# Patient Record
Sex: Female | Born: 2000 | Race: Black or African American | Hispanic: No | Smoking: Former smoker
Health system: Southern US, Community
[De-identification: ages and names within clinical notes are randomized; demographics above are authoritative.]

## PROBLEM LIST (undated history)

## (undated) ENCOUNTER — Inpatient Hospital Stay (HOSPITAL_COMMUNITY): Payer: Self-pay

## (undated) DIAGNOSIS — F29 Unspecified psychosis not due to a substance or known physiological condition: Secondary | ICD-10-CM

## (undated) DIAGNOSIS — K429 Umbilical hernia without obstruction or gangrene: Secondary | ICD-10-CM

## (undated) DIAGNOSIS — D649 Anemia, unspecified: Secondary | ICD-10-CM

## (undated) DIAGNOSIS — K5900 Constipation, unspecified: Secondary | ICD-10-CM

## (undated) HISTORY — PX: UMBILICAL HERNIA REPAIR: SHX196

## (undated) HISTORY — DX: Constipation, unspecified: K59.00

---
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
## 2018-01-13 ENCOUNTER — Inpatient Hospital Stay (HOSPITAL_COMMUNITY)
Admission: AD | Admit: 2018-01-13 | Discharge: 2018-01-13 | Disposition: A | Discharge status: Home / Self Care | Payer: Medicaid Other | Source: Ambulatory Visit | Attending: Obstetrics and Gynecology | Admitting: Obstetrics and Gynecology

## 2018-01-13 ENCOUNTER — Inpatient Hospital Stay (HOSPITAL_COMMUNITY): Payer: Medicaid Other

## 2018-01-13 ENCOUNTER — Other Ambulatory Visit: Payer: Self-pay

## 2018-01-13 ENCOUNTER — Encounter (HOSPITAL_COMMUNITY): Payer: Self-pay

## 2018-01-13 DIAGNOSIS — Z818 Family history of other mental and behavioral disorders: Secondary | ICD-10-CM | POA: Diagnosis not present

## 2018-01-13 DIAGNOSIS — Z79899 Other long term (current) drug therapy: Secondary | ICD-10-CM | POA: Insufficient documentation

## 2018-01-13 DIAGNOSIS — O99341 Other mental disorders complicating pregnancy, first trimester: Secondary | ICD-10-CM | POA: Insufficient documentation

## 2018-01-13 DIAGNOSIS — F209 Schizophrenia, unspecified: Secondary | ICD-10-CM | POA: Diagnosis not present

## 2018-01-13 DIAGNOSIS — Z811 Family history of alcohol abuse and dependence: Secondary | ICD-10-CM | POA: Diagnosis not present

## 2018-01-13 DIAGNOSIS — O26891 Other specified pregnancy related conditions, first trimester: Secondary | ICD-10-CM | POA: Diagnosis not present

## 2018-01-13 DIAGNOSIS — R1030 Lower abdominal pain, unspecified: Secondary | ICD-10-CM | POA: Insufficient documentation

## 2018-01-13 DIAGNOSIS — R109 Unspecified abdominal pain: Secondary | ICD-10-CM

## 2018-01-13 DIAGNOSIS — Z8249 Family history of ischemic heart disease and other diseases of the circulatory system: Secondary | ICD-10-CM | POA: Insufficient documentation

## 2018-01-13 DIAGNOSIS — Z825 Family history of asthma and other chronic lower respiratory diseases: Secondary | ICD-10-CM | POA: Diagnosis not present

## 2018-01-13 DIAGNOSIS — Z3A08 8 weeks gestation of pregnancy: Secondary | ICD-10-CM | POA: Insufficient documentation

## 2018-01-13 DIAGNOSIS — Z813 Family history of other psychoactive substance abuse and dependence: Secondary | ICD-10-CM | POA: Diagnosis not present

## 2018-01-13 DIAGNOSIS — Z3491 Encounter for supervision of normal pregnancy, unspecified, first trimester: Secondary | ICD-10-CM

## 2018-01-13 DIAGNOSIS — Z823 Family history of stroke: Secondary | ICD-10-CM | POA: Diagnosis not present

## 2018-01-13 LAB — URINALYSIS, ROUTINE W REFLEX MICROSCOPIC
BILIRUBIN URINE: NEGATIVE
Bacteria, UA: NONE SEEN
Glucose, UA: NEGATIVE mg/dL
HGB URINE DIPSTICK: NEGATIVE
Ketones, ur: NEGATIVE mg/dL
NITRITE: NEGATIVE
PROTEIN: NEGATIVE mg/dL
SPECIFIC GRAVITY, URINE: 1.024 (ref 1.005–1.030)
pH: 6 (ref 5.0–8.0)

## 2018-01-13 LAB — WET PREP, GENITAL
SPERM: NONE SEEN
Trich, Wet Prep: NONE SEEN
Yeast Wet Prep HPF POC: NONE SEEN

## 2018-01-13 LAB — CBC
HEMATOCRIT: 37.4 % (ref 36.0–46.0)
HEMOGLOBIN: 12.9 g/dL (ref 12.0–15.0)
MCH: 29.1 pg (ref 26.0–34.0)
MCHC: 34.5 g/dL (ref 30.0–36.0)
MCV: 84.4 fL (ref 78.0–100.0)
Platelets: 225 10*3/uL (ref 150–400)
RBC: 4.43 MIL/uL (ref 3.87–5.11)
RDW: 12.6 % (ref 11.5–15.5)
WBC: 9.1 10*3/uL (ref 4.0–10.5)

## 2018-01-13 LAB — POCT PREGNANCY, URINE: Preg Test, Ur: POSITIVE — AB

## 2018-01-13 LAB — ABO/RH: ABO/RH(D): O POS

## 2018-01-13 LAB — HCG, QUANTITATIVE, PREGNANCY: HCG, BETA CHAIN, QUANT, S: 165510 m[IU]/mL — AB (ref <5)

## 2018-01-13 NOTE — MAU Provider Note (Signed)
History     CSN: [id number]  Arrival date and time: 01/13/18 1052   First Provider Initiated Contact with Patient 01/13/18 1151      Chief Complaint  Patient presents with  . Abdominal Pain   HPI Dana Flores is a 17 y.o. G1P0 at unknown gestational age who presents with abdominal pain. Was treated for PID by her PCP in April but did not complete treatment due to side effects. A week after she stopped taking the medication, she had a positive pregnancy test.  Reports continued lower abdominal pain since then. Rates pain 3/10. Has not treated. Denies fever/chills, dysuria, vaginal bleeding, or vaginal discharge. Has initial OB appointment scheduled with [facility].    Past Medical History:  Diagnosis Date  . 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 Umbilical hernia     Past Surgical History:  Procedure Laterality Date  . UMBILICAL HERNIA REPAIR      Family History  Problem Relation Age of Onset  . ADD / ADHD Father   . Alcohol abuse Father   . Asthma Father   . Drug abuse Father   . Hypertension Father   . Stroke Father   . ADD / ADHD Brother   . Anxiety disorder Brother   . Asthma Brother   . Heart disease Maternal Grandmother   . Obesity Maternal Grandmother   . Diabetes Paternal Grandmother   . Hyperlipidemia Paternal Grandmother   . Obesity Paternal Grandmother     Social History   Tobacco Use  . Smoking status: Never Smoker  . Smokeless tobacco: Never Used  Substance Use Topics  . Alcohol use: No  . Drug use: Not Currently    Types: Marijuana    Comment: pt. states she hasn't used since 2017    Allergies: No Known Allergies  Medications Prior to Admission  Medication Sig Dispense Refill Last Dose  . benztropine (COGENTIN) 0.5 MG tablet Take 1 tablet (0.5 mg total) by mouth daily. 30 tablet 0   . benztropine (COGENTIN) 1 MG tablet Take 1 tablet (1 mg total) by mouth at bedtime. 30 tablet 0   .  divalproex (DEPAKOTE) 500 MG DR tablet Take 1 tablet (500 mg total) by mouth every 12 (twelve) hours. 60 tablet 0   . haloperidol (HALDOL) 2 MG tablet Take 1 tablet (2 mg total) by mouth at bedtime. 30 tablet 0   . hydrOXYzine (ATARAX/VISTARIL) 25 MG tablet Take 1 tablet (25 mg total) by mouth 3 (three) times daily as needed for anxiety (sleep). 30 tablet 0   . naproxen sodium (ANAPROX) 220 MG tablet Take 440 mg by mouth every 12 (twelve) hours as needed (CRAMPING).   Past Week at Unknown time  . OLANZapine (ZYPREXA) 20 MG tablet Take 1 tablet (20 mg total) by mouth at bedtime. 30 tablet 0   . OLANZapine zydis (ZYPREXA) 20 MG disintegrating tablet Take 1 tablet (20 mg total) by mouth daily. 30 tablet 0     Review of Systems  Constitutional: Negative.   Gastrointestinal: Positive for abdominal pain. Negative for constipation, diarrhea, nausea and vomiting.  Genitourinary: Negative.  Negative for dysuria, vaginal bleeding and vaginal discharge.   Physical Exam   Blood pressure 116/64, pulse 79, temperature 98.3 F (36.8 C), temperature source Oral, resp. rate 18, height 4\' 11"  (1.499 m), weight 160 lb 0.6 oz (72.6 kg), SpO2 100 %.  Physical Exam  Nursing note and vitals reviewed. Constitutional: She is oriented  to person, place, and time. She appears well-developed and well-nourished. No distress.  HENT:  Head: Normocephalic and atraumatic.  Eyes: Conjunctivae are normal. Right eye exhibits no discharge. Left eye exhibits no discharge. No scleral icterus.  Neck: Normal range of motion.  Respiratory: Effort normal. No respiratory distress.  GI: Soft. There is no tenderness.  Genitourinary: Cervix exhibits no motion tenderness. No bleeding in the vagina.  Genitourinary Comments: Cervix closed  Neurological: She is alert and oriented to person, place, and time.  Skin: Skin is warm and dry. She is not diaphoretic.  Psychiatric: She has a normal mood and affect. Her behavior is normal.  Judgment and thought content normal.    MAU Course  Procedures Results for orders placed or performed during the hospital encounter of 01/13/18 (from the past 24 hour(s))  Urinalysis, Routine w reflex microscopic     Status: Abnormal   Collection Time: 01/13/18 10:57 AM  Result Value Ref Range   Color, Urine YELLOW YELLOW   APPearance HAZY (A) CLEAR   Specific Gravity, Urine 1.024 1.005 - 1.030   pH 6.0 5.0 - 8.0   Glucose, UA NEGATIVE NEGATIVE mg/dL   Hgb urine dipstick NEGATIVE NEGATIVE   Bilirubin Urine NEGATIVE NEGATIVE   Ketones, ur NEGATIVE NEGATIVE mg/dL   Protein, ur NEGATIVE NEGATIVE mg/dL   Nitrite NEGATIVE NEGATIVE   Leukocytes, UA TRACE (A) NEGATIVE   RBC / HPF 0-5 0 - 5 RBC/hpf   WBC, UA 21-50 0 - 5 WBC/hpf   Bacteria, UA NONE SEEN NONE SEEN   Squamous Epithelial / LPF 11-20 0 - 5   Mucus PRESENT   Pregnancy, urine POC     Status: Abnormal   Collection Time: 01/13/18 11:12 AM  Result Value Ref Range   Preg Test, Ur POSITIVE (A) NEGATIVE  Wet prep, genital     Status: Abnormal   Collection Time: 01/13/18 12:10 PM  Result Value Ref Range   Yeast Wet Prep HPF POC NONE SEEN NONE SEEN   Trich, Wet Prep NONE SEEN NONE SEEN   Clue Cells Wet Prep HPF POC PRESENT (A) NONE SEEN   WBC, Wet Prep HPF POC FEW (A) NONE SEEN   Sperm NONE SEEN   CBC     Status: None   Collection Time: 01/13/18 12:18 PM  Result Value Ref Range   WBC 9.1 4.0 - 10.5 K/uL   RBC 4.43 3.87 - 5.11 MIL/uL   Hemoglobin 12.9 12.0 - 15.0 g/dL   HCT 16.1 09.6 - 04.5 %   MCV 84.4 78.0 - 100.0 fL   MCH 29.1 26.0 - 34.0 pg   MCHC 34.5 30.0 - 36.0 g/dL   RDW 40.9 81.1 - 91.4 %   Platelets 225 150 - 400 K/uL  ABO/Rh     Status: None (Preliminary result)   Collection Time: 01/13/18 12:18 PM  Result Value Ref Range   ABO/RH(D)      O POS Performed at [facility], [address], [place], [place] [address]   hCG, quantitative, pregnancy     Status: Abnormal   Collection Time: 01/13/18 12:18 PM   Result Value Ref Range   hCG, Beta Chain, Quant, S 165,510 (H) <5 mIU/mL   US Ob Less Than 14 Weeks With Ob Transvaginal  Result Date: 01/13/2018 CLINICAL DATA:  Pregnant patient with pain EXAM: OBSTETRIC <14 WK ULTRASOUND TECHNIQUE: Transabdominal ultrasound was performed for evaluation of the gestation as well as the maternal uterus and adnexal regions. COMPARISON:  None.  FINDINGS: Intrauterine gestational sac: Single Yolk sac:  Visualized. Embryo:  Visualized. Cardiac Activity: Visualized. Heart Rate: 174 bpm MSD:   mm    w     d CRL:   20.1 mm   8 w 4 d                  US EDC: August 21, 2018 Subchorionic hemorrhage:  None visualized. Maternal uterus/adnexae: Normal appearing ovaries. IMPRESSION: Single live IUP. Electronically Signed   By: Gerome Samavid  Williams III M.D   On: 01/13/2018 13:28    MDM +UPT UA, wet prep, GC/chlamydia, CBC, ABO/Rh, quant hCG, HIV, and US today to rule out ectopic pregnancy  Ultrasound shows SIUP with cardiac activity measuring [id number]  U/a with leuks -- will send for urine culture  Assessment and Plan  A:  1. Normal IUP (intrauterine pregnancy) on prenatal ultrasound, first trimester   2. Abdominal pain during pregnancy in first trimester   3. [redacted] weeks gestation of pregnancy    P: Discharge home Start prenatal care Discussed reasons to return to MAU GC/CT & urine culture pending  Judeth Hornrin Jamien Casanova 01/13/2018, 11:51 AM

## 2018-01-13 NOTE — MAU Note (Signed)
Pt. States she started taking an antibiotic at the end of April 2019 for PID, but states that it "caused her more pain" so she stopped taking it around the first week of May. Pt. Had positive home pregnancy test. Pt. States she has been having abdominal pain since taking the antibiotic.  Pt. Denies vaginal bleeding, but has noticed white discharge.

## 2018-01-13 NOTE — Discharge Instructions (Signed)
First Trimester of Pregnancy The first trimester of pregnancy is from week 1 until the end of week 13 (months 1 through 3). A week after a sperm fertilizes an egg, the egg will implant on the wall of the uterus. This embryo will begin to develop into a baby. Genes from you and your partner will form the baby. The female genes will determine whether the baby will be a boy or a girl. At 6-8 weeks, the eyes and face will be formed, and the heartbeat can be seen on ultrasound. At the end of 12 weeks, all the baby's organs will be formed. Now that you are pregnant, you will want to do everything you can to have a healthy baby. Two of the most important things are to get good prenatal care and to follow your health care provider's instructions. Prenatal care is all the medical care you receive before the baby's birth. This care will help prevent, find, and treat any problems during the pregnancy and childbirth. Body changes during your first trimester Your body goes through many changes during pregnancy. The changes vary from woman to woman.  You may gain or lose a couple of pounds at first.  You may feel sick to your stomach (nauseous) and you may throw up (vomit). If the vomiting is uncontrollable, call your health care provider.  You may tire easily.  You may develop headaches that can be relieved by medicines. All medicines should be approved by your health care provider.  You may urinate more often. Painful urination may mean you have a bladder infection.  You may develop heartburn as a result of your pregnancy.  You may develop constipation because certain hormones are causing the muscles that push stool through your intestines to slow down.  You may develop hemorrhoids or swollen veins (varicose veins).  Your breasts may begin to grow larger and become tender. Your nipples may stick out more, and the tissue that surrounds them (areola) may become darker.  Your gums may bleed and may be  sensitive to brushing and flossing.  Dark spots or blotches (chloasma, mask of pregnancy) may develop on your face. This will likely fade after the baby is born.  Your menstrual periods will stop.  You may have a loss of appetite.  You may develop cravings for certain kinds of food.  You may have changes in your emotions from day to day, such as being excited to be pregnant or being concerned that something may go wrong with the pregnancy and baby.  You may have more vivid and strange dreams.  You may have changes in your hair. These can include thickening of your hair, rapid growth, and changes in texture. Some women also have hair loss during or after pregnancy, or hair that feels dry or thin. Your hair will most likely return to normal after your baby is born.  What to expect at prenatal visits During a routine prenatal visit:  You will be weighed to make sure you and the baby are growing normally.  Your blood pressure will be taken.  Your abdomen will be measured to track your baby's growth.  The fetal heartbeat will be listened to between weeks 10 and 14 of your pregnancy.  Test results from any previous visits will be discussed.  Your health care provider may ask you:  How you are feeling.  If you are feeling the baby move.  If you have had any abnormal symptoms, such as leaking fluid, bleeding, severe headaches,   or abdominal cramping.  If you are using any tobacco products, including cigarettes, chewing tobacco, and electronic cigarettes.  If you have any questions.  Other tests that may be performed during your first trimester include:  Blood tests to find your blood type and to check for the presence of any previous infections. The tests will also be used to check for low iron levels (anemia) and protein on red blood cells (Rh antibodies). Depending on your risk factors, or if you previously had diabetes during pregnancy, you may have tests to check for high blood  sugar that affects pregnant women (gestational diabetes).  Urine tests to check for infections, diabetes, or protein in the urine.  An ultrasound to confirm the proper growth and development of the baby.  Fetal screens for spinal cord problems (spina bifida) and Down syndrome.  HIV (human immunodeficiency virus) testing. Routine prenatal testing includes screening for HIV, unless you choose not to have this test.  You may need other tests to make sure you and the baby are doing well.  Follow these instructions at home: Medicines  Follow your health care provider's instructions regarding medicine use. Specific medicines may be either safe or unsafe to take during pregnancy.  Take a prenatal vitamin that contains at least 600 micrograms (mcg) of folic acid.  If you develop constipation, try taking a stool softener if your health care provider approves. Eating and drinking  Eat a balanced diet that includes fresh fruits and vegetables, whole grains, good sources of protein such as meat, eggs, or tofu, and low-fat dairy. Your health care provider will help you determine the amount of weight gain that is right for you.  Avoid raw meat and uncooked cheese. These carry germs that can cause birth defects in the baby.  Eating four or five small meals rather than three large meals a day may help relieve nausea and vomiting. If you start to feel nauseous, eating a few soda crackers can be helpful. Drinking liquids between meals, instead of during meals, also seems to help ease nausea and vomiting.  Limit foods that are high in fat and processed sugars, such as fried and sweet foods.  To prevent constipation: ? Eat foods that are high in fiber, such as fresh fruits and vegetables, whole grains, and beans. ? Drink enough fluid to keep your urine clear or pale yellow. Activity  Exercise only as directed by your health care provider. Most women can continue their usual exercise routine during  pregnancy. Try to exercise for 30 minutes at least 5 days a week. Exercising will help you: ? Control your weight. ? Stay in shape. ? Be prepared for labor and delivery.  Experiencing pain or cramping in the lower abdomen or lower back is a good sign that you should stop exercising. Check with your health care provider before continuing with normal exercises.  Try to avoid standing for long periods of time. Move your legs often if you must stand in one place for a long time.  Avoid heavy lifting.  Wear low-heeled shoes and practice good posture.  You may continue to have sex unless your health care provider tells you not to. Relieving pain and discomfort  Wear a good support bra to relieve breast tenderness.  Take warm sitz baths to soothe any pain or discomfort caused by hemorrhoids. Use hemorrhoid cream if your health care provider approves.  Rest with your legs elevated if you have leg cramps or low back pain.  If you develop   varicose veins in your legs, wear support hose. Elevate your feet for 15 minutes, 3-4 times a day. Limit salt in your diet. Prenatal care  Schedule your prenatal visits by the twelfth week of pregnancy. They are usually scheduled monthly at first, then more often in the last 2 months before delivery.  Write down your questions. Take them to your prenatal visits.  Keep all your prenatal visits as told by your health care provider. This is important. Safety  Wear your seat belt at all times when driving.  Make a list of emergency phone numbers, including numbers for family, friends, the hospital, and police and fire departments. General instructions  Ask your health care provider for a referral to a local prenatal education class. Begin classes no later than the beginning of month 6 of your pregnancy.  Ask for help if you have counseling or nutritional needs during pregnancy. Your health care provider can offer advice or refer you to specialists for help  with various needs.  Do not use hot tubs, steam rooms, or saunas.  Do not douche or use tampons or scented sanitary pads.  Do not cross your legs for long periods of time.  Avoid cat litter boxes and soil used by cats. These carry germs that can cause birth defects in the baby and possibly loss of the fetus by miscarriage or stillbirth.  Avoid all smoking, herbs, alcohol, and medicines not prescribed by your health care provider. Chemicals in these products affect the formation and growth of the baby.  Do not use any products that contain nicotine or tobacco, such as cigarettes and e-cigarettes. If you need help quitting, ask your health care provider. You may receive counseling support and other resources to help you quit.  Schedule a dentist appointment. At home, brush your teeth with a soft toothbrush and be gentle when you floss. Contact a health care provider if:  You have dizziness.  You have mild pelvic cramps, pelvic pressure, or nagging pain in the abdominal area.  You have persistent nausea, vomiting, or diarrhea.  You have a bad smelling vaginal discharge.  You have pain when you urinate.  You notice increased swelling in your face, hands, legs, or ankles.  You are exposed to fifth disease or chickenpox.  You are exposed to German measles (rubella) and have never had it. Get help right away if:  You have a fever.  You are leaking fluid from your vagina.  You have spotting or bleeding from your vagina.  You have severe abdominal cramping or pain.  You have rapid weight gain or loss.  You vomit blood or material that looks like coffee grounds.  You develop a severe headache.  You have shortness of breath.  You have any kind of trauma, such as from a fall or a car accident. Summary  The first trimester of pregnancy is from week 1 until the end of week 13 (months 1 through 3).  Your body goes through many changes during pregnancy. The changes vary from  woman to woman.  You will have routine prenatal visits. During those visits, your health care provider will examine you, discuss any test results you may have, and talk with you about how you are feeling. This information is not intended to replace advice given to you by your health care provider. Make sure you discuss any questions you have with your health care provider. Document Released: 07/26/2001 Document Revised: 07/13/2016 Document Reviewed: 07/13/2016 Elsevier Interactive Patient Education  2018 Elsevier   Inc.  

## 2018-01-15 ENCOUNTER — Telehealth: Payer: Self-pay | Admitting: Student

## 2018-01-15 DIAGNOSIS — O2341 Unspecified infection of urinary tract in pregnancy, first trimester: Secondary | ICD-10-CM

## 2018-01-15 LAB — CULTURE, OB URINE: Culture: >=100000 — AB

## 2018-01-15 LAB — GC/CHLAMYDIA PROBE AMP (~~LOC~~) NOT AT ARMC
Chlamydia: NEGATIVE
Neisseria Gonorrhea: NEGATIVE

## 2018-01-15 MED ORDER — FOSFOMYCIN TROMETHAMINE 3 G PO PACK: 3.0000 g | PACK | Freq: Once | ORAL | 0 refills | Status: AC

## 2018-01-15 NOTE — Telephone Encounter (Signed)
Verified by name & DOB. Notified of positive urine cx. Verified NKDA. Meds sent to pharmacy.   Urine culture reviewed with pharmacist. Recommends 1 dose of fosfomycin.   Judeth HornLawrence, Delvis Kau, NP

## 2018-01-24 ENCOUNTER — Encounter: Payer: Medicaid Other | Admitting: Certified Nurse Midwife

## 2018-02-02 ENCOUNTER — Telehealth: Payer: Self-pay | Admitting: Licensed Clinical Social Worker

## 2018-02-02 NOTE — Telephone Encounter (Signed)
Patient called. Appt confirmed

## 2018-02-05 ENCOUNTER — Encounter: Payer: Self-pay | Admitting: Certified Nurse Midwife

## 2018-02-05 ENCOUNTER — Other Ambulatory Visit (HOSPITAL_COMMUNITY)
Admission: RE | Admit: 2018-02-05 | Discharge: 2018-02-05 | Disposition: A | Discharge status: Home / Self Care | Payer: Medicaid Other | Source: Ambulatory Visit | Attending: Certified Nurse Midwife | Admitting: Certified Nurse Midwife

## 2018-02-05 ENCOUNTER — Ambulatory Visit (INDEPENDENT_AMBULATORY_CARE_PROVIDER_SITE_OTHER): Payer: Medicaid Other | Admitting: Certified Nurse Midwife

## 2018-02-05 VITALS — BP 110/73 | HR 87 | Wt 159.0 lb

## 2018-02-05 DIAGNOSIS — Z3401 Encounter for supervision of normal first pregnancy, first trimester: Secondary | ICD-10-CM | POA: Insufficient documentation

## 2018-02-05 DIAGNOSIS — Z3400 Encounter for supervision of normal first pregnancy, unspecified trimester: Secondary | ICD-10-CM

## 2018-02-05 DIAGNOSIS — Z8719 Personal history of other diseases of the digestive system: Secondary | ICD-10-CM | POA: Insufficient documentation

## 2018-02-05 DIAGNOSIS — O99611 Diseases of the digestive system complicating pregnancy, first trimester: Secondary | ICD-10-CM

## 2018-02-05 DIAGNOSIS — O2341 Unspecified infection of urinary tract in pregnancy, first trimester: Secondary | ICD-10-CM

## 2018-02-05 DIAGNOSIS — Z9889 Other specified postprocedural states: Secondary | ICD-10-CM

## 2018-02-05 DIAGNOSIS — O219 Vomiting of pregnancy, unspecified: Secondary | ICD-10-CM

## 2018-02-05 DIAGNOSIS — K219 Gastro-esophageal reflux disease without esophagitis: Secondary | ICD-10-CM

## 2018-02-05 MED ORDER — RANITIDINE HCL 150 MG/10ML PO SYRP: 150.0000 mg | ORAL_SOLUTION | Freq: Two times a day (BID) | ORAL | 5 refills | Status: DC

## 2018-02-05 MED ORDER — DOXYLAMINE-PYRIDOXINE 10-10 MG PO TBEC: DELAYED_RELEASE_TABLET | ORAL | 4 refills | Status: DC

## 2018-02-05 MED ORDER — CLINDAMYCIN HCL 300 MG PO CAPS: 300.0000 mg | ORAL_CAPSULE | Freq: Three times a day (TID) | ORAL | 0 refills | Status: DC

## 2018-02-05 NOTE — Addendum Note (Signed)
Addended byFrutoso Chase: [facility], Mearl Olver on: 02/05/2018 02:48 PM   Modules accepted: Orders

## 2018-02-05 NOTE — Progress Notes (Signed)
Subjective:   Dana Flores is a 17 y.o. G1P0 at [redacted]w[redacted]d by LMP, early ultrasound being seen today for her first obstetrical visit.  Her obstetrical history is significant for obesity and hx of depression/mental issues that according to the patient were situationally induced. Patient does intend to breast feed. Pregnancy history fully reviewed.  Patient reports heartburn, nausea, no bleeding, no contractions, no cramping, no leaking and vomiting.  HISTORY: OB History  Gravida Para Term Preterm AB Living  1 0 0 0 0 0  SAB TAB Ectopic Multiple Live Births  0 0 0 0 0    # Outcome Date GA Lbr Len/2nd Weight Sex Delivery Anes PTL Lv  1 Current             Last pap smear was done n/a <21 years.   Past Medical History:  Diagnosis Date  . 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 Umbilical hernia    Past Surgical History:  Procedure Laterality Date  . TOOTH EXTRACTION  2014  . UMBILICAL HERNIA REPAIR     Family History  Problem Relation Age of Onset  . ADD / ADHD Father   . Alcohol abuse Father   . Asthma Father   . Drug abuse Father   . Hypertension Father   . Stroke Father   . ADD / ADHD Brother   . Anxiety disorder Brother   . Asthma Brother   . Heart disease Maternal Grandmother   . Obesity Maternal Grandmother   . Early death Maternal Grandmother   . Diabetes Paternal Grandmother   . Hyperlipidemia Paternal Grandmother   . Obesity Paternal Grandmother    Social History   Tobacco Use  . Smoking status: Never Smoker  . Smokeless tobacco: Never Used  Substance Use Topics  . Alcohol use: No  . Drug use: Not Currently    Comment: pt. states she hasn't used since 2017   No Known Allergies Current Outpatient Medications on File Prior to Visit  Medication Sig Dispense Refill  . Prenatal MV & Min w/FA-DHA (PRENATAL ADULT GUMMY/DHA/FA PO) Take by mouth.     No current facility-administered medications on  file prior to visit.     Review of Systems Pertinent items noted in HPI and remainder of comprehensive ROS otherwise negative.  Exam   Vitals:   02/05/18 1318  BP: 110/73  Pulse: 87  Weight: 159 lb (72.1 kg)   Fetal Heart Rate (bpm): 177; doppler  Uterus:     Pelvic Exam: Perineum: no hemorrhoids, normal perineum.  Pelvic exam declined/refused  System: General: well-developed, well-nourished female in no acute distress   Breast:  normal appearance, no masses or tenderness   Skin: normal coloration and turgor, no rashes   Neurologic: oriented, normal, negative, normal mood   Extremities: normal strength, tone, and muscle mass, ROM of all joints is normal   HEENT PERRLA, extraocular movement intact and sclera clear, anicteric   Mouth/Teeth mucous membranes moist, pharynx normal without lesions and dental hygiene good   Neck supple and no masses   Cardiovascular: regular rate and rhythm   Respiratory:  no respiratory distress, normal breath sounds   Abdomen: soft, non-tender; bowel sounds normal; no masses,  no organomegaly     Assessment:   Pregnancy: G1P0 Patient Active Problem List   Diagnosis Date Noted  . Supervision of normal first pregnancy, antepartum 02/05/2018     Plan:  1. UTI (urinary tract  infection) during pregnancy, first trimester     Discussed with Dr. Clearance CootsHarper.  - clindamycin (CLEOCIN) 300 MG capsule; Take 1 capsule (300 mg total) by mouth 3 (three) times daily.  Dispense: 21 capsule; Refill: 0  2. Encounter for supervision of normal first pregnancy in first trimester    - Obstetric Panel, Including HIV - Hemoglobinopathy evaluation - Inheritest Core(CF97,SMA,FraX) - Hemoglobin A1c - Vitamin D (25 hydroxy) - US MFM OB DETAIL +14 WK; Future  3. Nausea and vomiting in pregnancy    - Doxylamine-Pyridoxine (DICLEGIS) 10-10 MG TBEC; Take 1 tablet with breakfast and lunch.  Take 2 tablets at bedtime.  Dispense: 100 tablet; Refill: 4  4. Supervision of  normal first pregnancy, antepartum       5. Gastroesophageal reflux during pregnancy in first trimester, antepartum     - ranitidine (ZANTAC) 150 MG/10ML syrup; Take 10 mLs (150 mg total) by mouth 2 (two) times daily.  Dispense: 473 mL; Refill: 5   Initial labs drawn. Continue prenatal vitamins. Genetic Screening discussed, NIPS: ordered. Ultrasound discussed; fetal anatomic survey: ordered. Problem list reviewed and updated. The nature of [facility] with multiple MDs and other Advanced Practice Providers was explained to patient; also emphasized that residents, students are part of our team. Routine obstetric precautions reviewed. Return in about 1 month (around 03/05/2018) for ROB.     Dana Flores, CNM [facility], [facility]

## 2018-02-06 LAB — CERVICOVAGINAL ANCILLARY ONLY
Chlamydia: NEGATIVE
Neisseria Gonorrhea: NEGATIVE

## 2018-02-08 LAB — OBSTETRIC PANEL, INCLUDING HIV
Antibody Screen: NEGATIVE
BASOS ABS: 0 10*3/uL (ref 0.0–0.2)
BASOS: 0 %
EOS (ABSOLUTE): 0.1 10*3/uL (ref 0.0–0.4)
EOS: 1 %
HEMATOCRIT: 37.1 % (ref 34.0–46.6)
HEMOGLOBIN: 13 g/dL (ref 11.1–15.9)
HIV SCREEN 4TH GENERATION: NONREACTIVE
Hepatitis B Surface Ag: NEGATIVE
Immature Grans (Abs): 0 10*3/uL (ref 0.0–0.1)
Immature Granulocytes: 0 %
Lymphocytes Absolute: 2 10*3/uL (ref 0.7–3.1)
Lymphs: 21 %
MCH: 28.7 pg (ref 26.6–33.0)
MCHC: 35 g/dL (ref 31.5–35.7)
MCV: 82 fL (ref 79–97)
Monocytes Absolute: 0.8 10*3/uL (ref 0.1–0.9)
Monocytes: 8 %
NEUTROS ABS: 6.8 10*3/uL (ref 1.4–7.0)
Neutrophils: 70 %
PLATELETS: 213 10*3/uL (ref 150–450)
RBC: 4.53 x10E6/uL (ref 3.77–5.28)
RDW: 14.1 % (ref 12.3–15.4)
RH TYPE: POSITIVE
RPR: NONREACTIVE
RUBELLA: 1.42 {index} (ref >0.99)
WBC: 9.7 10*3/uL (ref 3.4–10.8)

## 2018-02-08 LAB — HEMOGLOBINOPATHY EVALUATION
HGB A: 97.7 % (ref 96.4–98.8)
HGB C: 0 %
HGB S: 0 %
HGB VARIANT: 0 %
Hemoglobin A2 Quantitation: 2.3 % (ref 1.8–3.2)
Hemoglobin F Quantitation: 0 % (ref 0.0–2.0)

## 2018-02-08 LAB — HEMOGLOBIN A1C
Est. average glucose Bld gHb Est-mCnc: 100 mg/dL
Hgb A1c MFr Bld: 5.1 % (ref 4.8–5.6)

## 2018-02-08 LAB — VITAMIN D 25 HYDROXY (VIT D DEFICIENCY, FRACTURES): Vit D, 25-Hydroxy: 18.7 ng/mL — ABNORMAL LOW (ref 30.0–100.0)

## 2018-02-12 ENCOUNTER — Encounter: Payer: Self-pay | Admitting: *Deleted

## 2018-02-14 LAB — INHERITEST CORE(CF97,SMA,FRAX)

## 2018-02-19 ENCOUNTER — Encounter: Payer: Self-pay | Admitting: *Deleted

## 2018-02-20 ENCOUNTER — Other Ambulatory Visit: Payer: Self-pay | Admitting: Certified Nurse Midwife

## 2018-02-20 DIAGNOSIS — Z3400 Encounter for supervision of normal first pregnancy, unspecified trimester: Secondary | ICD-10-CM

## 2018-02-22 ENCOUNTER — Other Ambulatory Visit: Payer: Self-pay | Admitting: Certified Nurse Midwife

## 2018-02-22 DIAGNOSIS — Z3400 Encounter for supervision of normal first pregnancy, unspecified trimester: Secondary | ICD-10-CM

## 2018-03-06 ENCOUNTER — Ambulatory Visit (INDEPENDENT_AMBULATORY_CARE_PROVIDER_SITE_OTHER): Payer: Medicaid Other | Admitting: Obstetrics

## 2018-03-06 ENCOUNTER — Encounter: Payer: Self-pay | Admitting: Obstetrics

## 2018-03-06 VITALS — BP 109/76 | HR 85 | Wt 161.8 lb

## 2018-03-06 DIAGNOSIS — Z3402 Encounter for supervision of normal first pregnancy, second trimester: Secondary | ICD-10-CM

## 2018-03-06 DIAGNOSIS — Z3400 Encounter for supervision of normal first pregnancy, unspecified trimester: Secondary | ICD-10-CM

## 2018-03-06 MED ORDER — VITAFOL GUMMIES 3.33-0.333-34.8 MG PO CHEW: 3.0000 | CHEWABLE_TABLET | Freq: Every day | ORAL | 11 refills | Status: DC

## 2018-03-06 NOTE — Progress Notes (Signed)
Subjective:  Dana Flores is a 17 y.o. G1P0 at 8020w0d being seen today for ongoing prenatal care.  She is currently monitored for the following issues for this low-risk pregnancy and has Supervision of normal first pregnancy, antepartum and History of umbilical hernia repair on their problem list.  Patient reports heartburn.  Contractions: Not present. Vag. Bleeding: None.  Movement: Present. Denies leaking of fluid.   The following portions of the patient's history were reviewed and updated as appropriate: allergies, current medications, past family history, past medical history, past social history, past surgical history and problem list. Problem list updated.  Objective:   Vitals:   03/06/18 1403  BP: 109/76  Pulse: 85  Weight: 161 lb 12.8 oz (73.4 kg)    Fetal Status:     Movement: Present     General:  Alert, oriented and cooperative. Patient is in no acute distress.  Skin: Skin is warm and dry. No rash noted.   Cardiovascular: Normal heart rate noted  Respiratory: Normal respiratory effort, no problems with respiration noted  Abdomen: Soft, gravid, appropriate for gestational age. Pain/Pressure: Present     Pelvic:  Cervical exam deferred        Extremities: Normal range of motion.  Edema: None  Mental Status: Normal mood and affect. Normal behavior. Normal judgment and thought content.   Urinalysis:      Assessment and Plan:  Pregnancy: G1P0 at 6020w0d  1. Supervision of normal first pregnancy, antepartum Rx: - AFP, Serum, Open Spina Bifida  Preterm labor symptoms and general obstetric precautions including but not limited to vaginal bleeding, contractions, leaking of fluid and fetal movement were reviewed in detail with the patient. Please refer to After Visit Summary for other counseling recommendations.  Return in about 1 month (around 04/03/2018) for ROB.   Brock BadHarper, Charles A, MD

## 2018-03-06 NOTE — Progress Notes (Signed)
Subjective: Dana Flores is a G1P0 at 6321w0d who presents to the [facility] today for ob visit.  She does have a history of any mental health concerns. She is currently sexually active. She is currently using OCP for birth control. She has not had recent STD screening. Patient states father of baby as her support system.   BP 109/76   Pulse 85   Wt 161 lb 12.8 oz (73.4 kg)   LMP  (LMP Unknown)   BMI 32.68 kg/m   Birth Control History:  OCP  MDM Patient counseled on all options for birth control today including LARC. Patient desires OCP initiated for birth control   Assessment:  17 y.o. female considering OCP for birth control  Plan:  Case management with [facility] sent   Gwyndolyn SaxonFigueroa, Romar Woodrick, Alexander MtLCSW 03/06/2018 4:16 PM

## 2018-03-08 LAB — AFP, SERUM, OPEN SPINA BIFIDA
AFP MoM: 1.98
AFP Value: 67.3 ng/mL
GEST. AGE ON COLLECTION DATE: 16 wk
MATERNAL AGE AT EDD: 19.6 a
OSBR Risk 1 IN: 1685
TEST RESULTS AFP: NEGATIVE
Weight: 161 [lb_av]

## 2018-03-18 ENCOUNTER — Emergency Department (HOSPITAL_COMMUNITY)
Admission: EM | Admit: 2018-03-18 | Discharge: 2018-03-19 | Disposition: A | Discharge status: Home / Self Care | Payer: Medicaid Other | Attending: Emergency Medicine | Admitting: Emergency Medicine

## 2018-03-18 ENCOUNTER — Encounter (HOSPITAL_COMMUNITY): Payer: Self-pay | Admitting: Emergency Medicine

## 2018-03-18 DIAGNOSIS — Z79899 Other long term (current) drug therapy: Secondary | ICD-10-CM | POA: Insufficient documentation

## 2018-03-18 DIAGNOSIS — W57XXXA Bitten or stung by nonvenomous insect and other nonvenomous arthropods, initial encounter: Secondary | ICD-10-CM | POA: Diagnosis not present

## 2018-03-18 DIAGNOSIS — S40861A Insect bite (nonvenomous) of right upper arm, initial encounter: Secondary | ICD-10-CM | POA: Insufficient documentation

## 2018-03-18 DIAGNOSIS — Y929 Unspecified place or not applicable: Secondary | ICD-10-CM | POA: Insufficient documentation

## 2018-03-18 DIAGNOSIS — Y998 Other external cause status: Secondary | ICD-10-CM | POA: Insufficient documentation

## 2018-03-18 DIAGNOSIS — F209 Schizophrenia, unspecified: Secondary | ICD-10-CM | POA: Diagnosis not present

## 2018-03-18 DIAGNOSIS — Y9389 Activity, other specified: Secondary | ICD-10-CM | POA: Diagnosis not present

## 2018-03-18 DIAGNOSIS — T63481A Toxic effect of venom of other arthropod, accidental (unintentional), initial encounter: Secondary | ICD-10-CM

## 2018-03-18 NOTE — ED Triage Notes (Signed)
Pt presents with area of redness, swelling, warmth, to R posterior upper arm, pt reports itching, no pain; suspected a bug bite but didn't see it; noticed it yesterday but worsened today; no known allergies

## 2018-03-18 NOTE — ED Notes (Signed)
Pt asked for a cold pack or wet wash rag; pt was informed to wait for the nurse to look at the area first.

## 2018-03-19 MED ORDER — CEPHALEXIN 500 MG PO CAPS: 500.0000 mg | ORAL_CAPSULE | Freq: Four times a day (QID) | ORAL | 0 refills | Status: DC

## 2018-03-19 MED ORDER — DEXAMETHASONE 4 MG PO TABS
10.0000 mg | ORAL_TABLET | Freq: Once | ORAL | Status: DC
  Filled 2018-03-19: qty 3

## 2018-03-19 MED ORDER — CEPHALEXIN 250 MG PO CAPS
500.0000 mg | ORAL_CAPSULE | Freq: Once | ORAL | Status: AC
  Administered 2018-03-19: 500 mg via ORAL
  Filled 2018-03-19: qty 2

## 2018-03-19 NOTE — Discharge Instructions (Signed)
Continue with use of topical hydrocortisone for itching. You may also use Benadryl, Zyrtec, or Claritin for management of persistent itching. Take keflex as prescribed to cover for possible early infection. Follow up with your primary care doctor to ensure resolution of symptoms.

## 2018-03-19 NOTE — ED Provider Notes (Signed)
[facility] EMERGENCY DEPARTMENT Provider Note   CSN: [id number] Arrival date & time: 03/18/18  2341     History   Chief Complaint Chief Complaint  Patient presents with  . Skin problem    HPI Dana Flores is a 17 y.o. female.  17 year old female presents to the emergency department for evaluation of an insect bite.  She is unaware of what bit her, but noticed the spot yesterday.  She states that it has been worse today and persistently itching.  She has used topical hydrocortisone with some relief, but itching returns.  She denies any pain.  She is concerned about the redness and swelling.  Denies any fevers, numbness, paresthesias.  No additional medications taken prior to arrival.  The history is provided by the patient. No language interpreter was used.    Past Medical History:  Diagnosis Date  . 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 Umbilical hernia     Patient Active Problem List   Diagnosis Date Noted  . Supervision of normal first pregnancy, antepartum 02/05/2018  . History of umbilical hernia repair 02/05/2018    Past Surgical History:  Procedure Laterality Date  . TOOTH EXTRACTION  2014  . UMBILICAL HERNIA REPAIR       OB History    Gravida  1   Para      Term      Preterm      AB      Living        SAB      TAB      Ectopic      Multiple      Live Births               Home Medications    Prior to Admission medications   Medication Sig Start Date End Date Taking? Authorizing Provider  cephALEXin (KEFLEX) 500 MG capsule Take 1 capsule (500 mg total) by mouth 4 (four) times daily. 03/19/18   Antony Madura, PA-C  clindamycin (CLEOCIN) 300 MG capsule Take 1 capsule (300 mg total) by mouth 3 (three) times daily. 02/05/18   Orvilla Cornwall A, CNM  Doxylamine-Pyridoxine (DICLEGIS) 10-10 MG TBEC Take 1 tablet with breakfast and lunch.  Take 2 tablets at bedtime.  02/05/18   Roe Coombs, CNM  Prenatal Vit-Fe Phos-FA-Omega (VITAFOL GUMMIES) 3.33-0.333-34.8 MG CHEW Chew 3 tablets by mouth daily before breakfast. 03/06/18   Brock Bad, MD  ranitidine (ZANTAC) 150 MG/10ML syrup Take 10 mLs (150 mg total) by mouth 2 (two) times daily. 02/05/18   Roe Coombs, CNM    Family History Family History  Problem Relation Age of Onset  . ADD / ADHD Father   . Alcohol abuse Father   . Asthma Father   . Drug abuse Father   . Hypertension Father   . Stroke Father   . ADD / ADHD Brother   . Anxiety disorder Brother   . Asthma Brother   . Heart disease Maternal Grandmother   . Obesity Maternal Grandmother   . Early death Maternal Grandmother   . Diabetes Paternal Grandmother   . Hyperlipidemia Paternal Grandmother   . Obesity Paternal Grandmother     Social History Social History   Tobacco Use  . Smoking status: Never Smoker  . Smokeless tobacco: Never Used  Substance Use Topics  . Alcohol use: No  . Drug use: Not Currently    Comment: pt. states  she hasn't used since 2017     Allergies   Patient has no known allergies.   Review of Systems Review of Systems Ten systems reviewed and are negative for acute change, except as noted in the HPI.    Physical Exam Updated Vital Signs BP 130/62 (BP Location: Left Arm)   Pulse 74   Temp 98.7 F (37.1 C) (Oral)   Resp 16   LMP  (LMP Unknown)   SpO2 99%   Physical Exam  Constitutional: She is oriented to person, place, and time. She appears well-developed and well-nourished. No distress.  Nontoxic appearing and in NAD  HENT:  Head: Normocephalic and atraumatic.  Eyes: Conjunctivae and EOM are normal. No scleral icterus.  Neck: Normal range of motion.  Cardiovascular: Normal rate, regular rhythm and intact distal pulses.  Pulmonary/Chest: Effort normal. No respiratory distress.  Respirations even and unlabored  Musculoskeletal: Normal range of motion.  Neurological: She is  alert and oriented to person, place, and time. She exhibits normal muscle tone. Coordination normal.  Skin: Skin is warm and dry. No rash noted. She is not diaphoretic. No pallor.  Area of erythema and mild induration to the posterior proximal RUE. No lymphangitic streaking or fluctuance. Area blanching, slightly warm, pruritic.  Psychiatric: She has a normal mood and affect. Her behavior is normal.  Nursing note and vitals reviewed.    ED Treatments / Results  Labs (all labs ordered are listed, but only abnormal results are displayed) Labs Reviewed - No data to display  EKG None  Radiology No results found.  Procedures Procedures (including critical care time)  Medications Ordered in ED Medications  cephALEXin (KEFLEX) capsule 500 mg (500 mg Oral Given 03/19/18 0139)     Initial Impression / Assessment and Plan / ED Course  I have reviewed the triage vital signs and the nursing notes.  Pertinent labs & imaging results that were available during my care of the patient were reviewed by me and considered in my medical decision making (see chart for details).     17 year old female presents to the emergency department for evaluation of insect bite.  It is unclear what bit her.  Physical exam findings consistent with localized reaction to insect sting.  There is no concern for abscess at present.  Unclear whether she may be developing an early cellulitis.  Will put on short course of antibiotics for management.  Have advised topical hydrocortisone for itching.  Return precautions discussed and provided. Patient discharged in stable condition with no unaddressed concerns.   Final Clinical Impressions(s) / ED Diagnoses   Final diagnoses:  Local reaction to insect sting, accidental or unintentional, initial encounter    ED Discharge Orders        Ordered    cephALEXin (KEFLEX) 500 MG capsule  4 times daily     03/19/18 0125       Antony MaduraHumes, Isatou Agredano, PA-C 03/19/18 [id number]    Geoffery Lyonselo,  Douglas, MD 03/19/18 [phone]

## 2018-03-19 NOTE — ED Notes (Signed)
ED Provider at bedside. 

## 2018-03-19 NOTE — ED Notes (Signed)
Pt verbalizes understanding of d/c instructions. Pt received prescriptions. Pt ambulatory at d/c with all belongings and with family.   

## 2018-03-26 ENCOUNTER — Encounter (HOSPITAL_COMMUNITY): Payer: Self-pay

## 2018-04-02 ENCOUNTER — Ambulatory Visit (HOSPITAL_COMMUNITY)
Admission: RE | Admit: 2018-04-02 | Discharge: 2018-04-02 | Disposition: A | Discharge status: Home / Self Care | Payer: Medicaid Other | Source: Ambulatory Visit | Attending: Obstetrics | Admitting: Obstetrics

## 2018-04-02 ENCOUNTER — Encounter (HOSPITAL_COMMUNITY): Payer: Self-pay

## 2018-04-02 DIAGNOSIS — Z363 Encounter for antenatal screening for malformations: Secondary | ICD-10-CM | POA: Insufficient documentation

## 2018-04-02 DIAGNOSIS — O99212 Obesity complicating pregnancy, second trimester: Secondary | ICD-10-CM | POA: Diagnosis not present

## 2018-04-02 DIAGNOSIS — Z3A19 19 weeks gestation of pregnancy: Secondary | ICD-10-CM | POA: Diagnosis not present

## 2018-04-02 DIAGNOSIS — Z3401 Encounter for supervision of normal first pregnancy, first trimester: Secondary | ICD-10-CM | POA: Diagnosis present

## 2018-04-03 ENCOUNTER — Ambulatory Visit (INDEPENDENT_AMBULATORY_CARE_PROVIDER_SITE_OTHER): Payer: Medicaid Other | Admitting: Nurse Practitioner

## 2018-04-03 VITALS — BP 113/67 | HR 104 | Wt 165.9 lb

## 2018-04-03 DIAGNOSIS — Z3402 Encounter for supervision of normal first pregnancy, second trimester: Secondary | ICD-10-CM

## 2018-04-03 DIAGNOSIS — Z3400 Encounter for supervision of normal first pregnancy, unspecified trimester: Secondary | ICD-10-CM

## 2018-04-03 DIAGNOSIS — E669 Obesity, unspecified: Secondary | ICD-10-CM | POA: Insufficient documentation
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
## 2018-04-03 NOTE — Patient Instructions (Signed)
Second Trimester of Pregnancy The second trimester is from week 13 through week 28, month 4 through 6. This is often the time in pregnancy that you feel your best. Often times, morning sickness has lessened or quit. You may have more energy, and you may get hungry more often. Your unborn baby (fetus) is growing rapidly. At the end of the sixth month, he or she is about 9 inches long and weighs about 1 pounds. You will likely feel the baby move (quickening) between 18 and 20 weeks of pregnancy. Follow these instructions at home:  Avoid all smoking, herbs, and alcohol. Avoid drugs not approved by your doctor.  Do not use any tobacco products, including cigarettes, chewing tobacco, and electronic cigarettes. If you need help quitting, ask your doctor. You may get counseling or other support to help you quit.  Only take medicine as told by your doctor. Some medicines are safe and some are not during pregnancy.  Exercise only as told by your doctor. Stop exercising if you start having cramps.  Eat regular, healthy meals.  Wear a good support bra if your breasts are tender.  Do not use hot tubs, steam rooms, or saunas.  Wear your seat belt when driving.  Avoid raw meat, uncooked cheese, and liter boxes and soil used by cats.  Take your prenatal vitamins.  Take 1500-2000 milligrams of calcium daily starting at the 20th week of pregnancy until you deliver your baby.  Try taking medicine that helps you poop (stool softener) as needed, and if your doctor approves. Eat more fiber by eating fresh fruit, vegetables, and whole grains. Drink enough fluids to keep your pee (urine) clear or pale yellow.  Take warm water baths (sitz baths) to soothe pain or discomfort caused by hemorrhoids. Use hemorrhoid cream if your doctor approves.  If you have puffy, bulging veins (varicose veins), wear support hose. Raise (elevate) your feet for 15 minutes, 3-4 times a day. Limit salt in your diet.  Avoid heavy  lifting, wear low heals, and sit up straight.  Rest with your legs raised if you have leg cramps or low back pain.  Visit your dentist if you have not gone during your pregnancy. Use a soft toothbrush to brush your teeth. Be gentle when you floss.  You can have sex (intercourse) unless your doctor tells you not to.  Go to your doctor visits. Get help if:  You feel dizzy.  You have mild cramps or pressure in your lower belly (abdomen).  You have a nagging pain in your belly area.  You continue to feel sick to your stomach (nauseous), throw up (vomit), or have watery poop (diarrhea).  You have bad smelling fluid coming from your vagina.  You have pain with peeing (urination). Get help right away if:  You have a fever.  You are leaking fluid from your vagina.  You have spotting or bleeding from your vagina.  You have severe belly cramping or pain.  You lose or gain weight rapidly.  You have trouble catching your breath and have chest pain.  You notice sudden or extreme puffiness (swelling) of your face, hands, ankles, feet, or legs.  You have not felt the baby move in over an hour.  You have severe headaches that do not go away with medicine.  You have vision changes. This information is not intended to replace advice given to you by your health care provider. Make sure you discuss any questions you have with your health care   provider. Document Released: 10/26/2009 Document Revised: 01/07/2016 Document Reviewed: 10/02/2012 Elsevier Interactive Patient Education  2017 Elsevier Inc.  

## 2018-05-01 ENCOUNTER — Encounter: Payer: Medicaid Other | Admitting: Obstetrics and Gynecology

## 2018-05-16 ENCOUNTER — Encounter: Payer: Medicaid Other | Admitting: Certified Nurse Midwife

## 2018-05-18 ENCOUNTER — Other Ambulatory Visit: Payer: Self-pay

## 2018-05-18 ENCOUNTER — Inpatient Hospital Stay (HOSPITAL_COMMUNITY)
Admission: EM | Admit: 2018-05-18 | Discharge: 2018-05-18 | Payer: Medicaid Other | Attending: Emergency Medicine | Admitting: Emergency Medicine

## 2018-05-18 ENCOUNTER — Encounter (HOSPITAL_COMMUNITY): Payer: Self-pay | Admitting: Emergency Medicine

## 2018-05-18 DIAGNOSIS — Z79899 Other long term (current) drug therapy: Secondary | ICD-10-CM | POA: Diagnosis not present

## 2018-05-18 DIAGNOSIS — O9989 Other specified diseases and conditions complicating pregnancy, childbirth and the puerperium: Secondary | ICD-10-CM | POA: Insufficient documentation

## 2018-05-18 DIAGNOSIS — Z3A26 26 weeks gestation of pregnancy: Secondary | ICD-10-CM | POA: Diagnosis not present

## 2018-05-18 DIAGNOSIS — T1490XA Injury, unspecified, initial encounter: Secondary | ICD-10-CM

## 2018-05-18 DIAGNOSIS — Z3A27 27 weeks gestation of pregnancy: Secondary | ICD-10-CM | POA: Diagnosis not present

## 2018-05-18 DIAGNOSIS — Z041 Encounter for examination and observation following transport accident: Secondary | ICD-10-CM | POA: Insufficient documentation

## 2018-05-18 DIAGNOSIS — O9A212 Injury, poisoning and certain other consequences of external causes complicating pregnancy, second trimester: Secondary | ICD-10-CM

## 2018-05-18 NOTE — ED Notes (Signed)
[facility] present to transfer pt
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
## 2018-05-18 NOTE — ED Notes (Signed)
OB at bedside

## 2018-05-18 NOTE — ED Notes (Signed)
Rapid OB asked about transferring [facility] for further monitoring.  Directed to EDP.

## 2018-05-18 NOTE — ED Notes (Signed)
carelink called for pt transfer to MAU 

## 2018-05-18 NOTE — Discharge Instructions (Signed)
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
## 2018-05-22 ENCOUNTER — Telehealth: Payer: Self-pay | Admitting: Licensed Clinical Social Worker

## 2018-05-22 NOTE — Telephone Encounter (Signed)
Left detailed message regarding scheduled appt  

## 2018-05-24 ENCOUNTER — Encounter: Payer: Medicaid Other | Admitting: Certified Nurse Midwife

## 2018-06-06 ENCOUNTER — Ambulatory Visit (INDEPENDENT_AMBULATORY_CARE_PROVIDER_SITE_OTHER): Payer: Medicaid Other | Admitting: Obstetrics

## 2018-06-06 ENCOUNTER — Other Ambulatory Visit: Payer: Medicaid Other

## 2018-06-06 ENCOUNTER — Encounter: Payer: Self-pay | Admitting: Obstetrics

## 2018-06-06 ENCOUNTER — Other Ambulatory Visit: Payer: Self-pay

## 2018-06-06 VITALS — BP 114/65 | HR 99 | Wt 178.6 lb

## 2018-06-06 DIAGNOSIS — Z3400 Encounter for supervision of normal first pregnancy, unspecified trimester: Secondary | ICD-10-CM

## 2018-06-06 DIAGNOSIS — Z3403 Encounter for supervision of normal first pregnancy, third trimester: Secondary | ICD-10-CM

## 2018-06-06 DIAGNOSIS — G8929 Other chronic pain: Secondary | ICD-10-CM

## 2018-06-06 DIAGNOSIS — K219 Gastro-esophageal reflux disease without esophagitis: Secondary | ICD-10-CM

## 2018-06-06 DIAGNOSIS — M545 Low back pain: Secondary | ICD-10-CM

## 2018-06-06 MED ORDER — OMEPRAZOLE 20 MG PO CPDR: 20.0000 mg | DELAYED_RELEASE_CAPSULE | Freq: Two times a day (BID) | ORAL | 5 refills | Status: DC

## 2018-06-06 MED ORDER — COMFORT FIT MATERNITY SUPP SM MISC: 0 refills | Status: DC

## 2018-06-06 NOTE — Progress Notes (Signed)
Subjective:  Dana Flores is a 17 y.o. G1P0000 at [redacted]w[redacted]d being seen today for ongoing prenatal care.  She is currently monitored for the following issues for this low-risk pregnancy and has Supervision of normal first pregnancy, antepartum; History of umbilical hernia repair; and Obesity (BMI 30.0-34.9) on their problem list.  Patient reports backache and heartburn.  Contractions: Not present. Vag. Bleeding: None.  Movement: Present. Denies leaking of fluid.   The following portions of the patient's history were reviewed and updated as appropriate: allergies, current medications, past family history, past medical history, past social history, past surgical history and problem list. Problem list updated.  Objective:   Vitals:   06/06/18 0832  BP: 114/65  Pulse: 99  Weight: 178 lb 9.6 oz (81 kg)    Fetal Status: Fetal Heart Rate (bpm): 150   Movement: Present     General:  Alert, oriented and cooperative. Patient is in no acute distress.  Skin: Skin is warm and dry. No rash noted.   Cardiovascular: Normal heart rate noted  Respiratory: Normal respiratory effort, no problems with respiration noted  Abdomen: Soft, gravid, appropriate for gestational age. Pain/Pressure: Absent     Pelvic:  Cervical exam deferred        Extremities: Normal range of motion.  Edema: Trace  Mental Status: Normal mood and affect. Normal behavior. Normal judgment and thought content.   Urinalysis:      Assessment and Plan:  Pregnancy: G1P0000 at [redacted]w[redacted]d  1. Supervision of normal first pregnancy, antepartum Rx: - Glucose Tolerance, 2 Hours w/1 Hour - CBC - HIV antibody (with reflex) - RPR  2. Chronic midline low back pain without sciatica Rx: - Elastic Bandages & Supports (COMFORT FIT MATERNITY SUPP SM) MISC; Wear as directed.  Dispense: 1 each; Refill: 0  3. GERD without esophagitis Rx: - omeprazole (PRILOSEC) 20 MG capsule; Take 1 capsule (20 mg total) by mouth 2 (two) times daily before a meal.   Dispense: 60 capsule; Refill: 5  Preterm labor symptoms and general obstetric precautions including but not limited to vaginal bleeding, contractions, leaking of fluid and fetal movement were reviewed in detail with the patient. Please refer to After Visit Summary for other counseling recommendations.  Return in about 2 weeks (around 06/20/2018) for ROB.   Brock Bad, MD

## 2018-06-06 NOTE — Progress Notes (Signed)
ROB/GTT.  Declined FLU and TDAP vaccines.  C/o pain and movement in navel 7/10 x 1 week whenever she walks.

## 2018-06-07 LAB — CBC
HEMATOCRIT: 28.8 % — AB (ref 34.0–46.6)
HEMOGLOBIN: 9.4 g/dL — AB (ref 11.1–15.9)
MCH: 26.9 pg (ref 26.6–33.0)
MCHC: 32.6 g/dL (ref 31.5–35.7)
MCV: 82 fL (ref 79–97)
Platelets: 224 10*3/uL (ref 150–450)
RBC: 3.5 x10E6/uL — AB (ref 3.77–5.28)
RDW: 12.8 % (ref 12.3–15.4)
WBC: 9.6 10*3/uL (ref 3.4–10.8)

## 2018-06-07 LAB — GLUCOSE TOLERANCE, 2 HOURS W/ 1HR
Glucose, 1 hour: 78 mg/dL (ref 65–179)
Glucose, 2 hour: 71 mg/dL (ref 65–152)
Glucose, Fasting: 71 mg/dL (ref 65–91)

## 2018-06-07 LAB — RPR: RPR Ser Ql: NONREACTIVE

## 2018-06-07 LAB — HIV ANTIBODY (ROUTINE TESTING W REFLEX): HIV Screen 4th Generation wRfx: NONREACTIVE

## 2018-06-08 ENCOUNTER — Other Ambulatory Visit: Payer: Self-pay | Admitting: Obstetrics

## 2018-06-08 DIAGNOSIS — D508 Other iron deficiency anemias: Secondary | ICD-10-CM

## 2018-06-08 MED ORDER — FERROUS SULFATE 325 (65 FE) MG PO TABS: 325.0000 mg | ORAL_TABLET | Freq: Two times a day (BID) | ORAL | 5 refills | Status: DC

## 2018-06-20 ENCOUNTER — Encounter: Payer: Self-pay | Admitting: Advanced Practice Midwife

## 2018-06-20 ENCOUNTER — Ambulatory Visit (INDEPENDENT_AMBULATORY_CARE_PROVIDER_SITE_OTHER): Payer: Medicaid Other | Admitting: Advanced Practice Midwife

## 2018-06-20 VITALS — BP 107/70 | HR 99 | Wt 181.8 lb

## 2018-06-20 DIAGNOSIS — E669 Obesity, unspecified: Secondary | ICD-10-CM

## 2018-06-20 DIAGNOSIS — D508 Other iron deficiency anemias: Secondary | ICD-10-CM

## 2018-06-20 DIAGNOSIS — Z3400 Encounter for supervision of normal first pregnancy, unspecified trimester: Secondary | ICD-10-CM

## 2018-06-20 DIAGNOSIS — Z3403 Encounter for supervision of normal first pregnancy, third trimester: Secondary | ICD-10-CM

## 2018-06-20 DIAGNOSIS — M545 Low back pain: Secondary | ICD-10-CM

## 2018-06-20 DIAGNOSIS — G8929 Other chronic pain: Secondary | ICD-10-CM

## 2018-06-20 NOTE — Patient Instructions (Signed)

## 2018-06-20 NOTE — Progress Notes (Signed)
   PRENATAL VISIT NOTE  Subjective:  Dana Flores is a 17 y.o. G1P0000 at [redacted]w[redacted]d being seen today for ongoing prenatal care.  She is currently monitored for the following issues for this low-risk pregnancy and has Supervision of normal first pregnancy, antepartum; History of umbilical hernia repair; and Obesity (BMI 30.0-34.9) on their problem list.  Patient reports no complaints.  Contractions: Not present. Vag. Bleeding: None.  Movement: Present. Denies leaking of fluid.   The following portions of the patient's history were reviewed and updated as appropriate: allergies, current medications, past family history, past medical history, past social history, past surgical history and problem list. Problem list updated.  Objective:   Vitals:   06/20/18 0902  BP: 107/70  Pulse: 99  Weight: 181 lb 12.8 oz (82.5 kg)    Fetal Status: Fetal Heart Rate (bpm): 148 Fundal Height: 32 cm Movement: Present     General:  Alert, oriented and cooperative. Patient is in no acute distress.  Skin: Skin is warm and dry. No rash noted.   Cardiovascular: Normal heart rate noted  Respiratory: Normal respiratory effort, no problems with respiration noted  Abdomen: Soft, gravid, appropriate for gestational age.  Pain/Pressure: Present     Pelvic: Cervical exam deferred        Extremities: Normal range of motion.  Edema: None  Mental Status: Normal mood and affect. Normal behavior. Normal judgment and thought content.   Assessment and Plan:  Pregnancy: G1P0000 at [redacted]w[redacted]d  1. Encounter for supervision of normal first pregnancy in third trimester --No concerns today, continue routine care --Not interested in discussing options that are covered by [facility] contraception grant. Planning OCPs --Discussed daily kick counts, expectations for timing and strength of fetal movements. Interventions for decreased fetal movement  2. Iron deficiency anemia secondary to inadequate dietary iron intake --Not  taking --Has not picked up pills but will do so today  3. Obesity (BMI 30.0-34.9) --3 lb weight gain from previous visit, normal fundal height  4. Chronic midline low back pain without sciatica --Maternity belt providing some relief  Preterm labor symptoms and general obstetric precautions including but not limited to vaginal bleeding, contractions, leaking of fluid and fetal movement were reviewed in detail with the patient. Please refer to After Visit Summary for other counseling recommendations.  Return in about 2 weeks (around 07/04/2018).  Future Appointments  Date Time Provider Department Center  07/04/2018  9:15 AM Leftwich-Kirby, Wilmer Floor, CNM [facility] None   Calvert Cantor, [place]  06/20/18  11:36 AM

## 2018-06-20 NOTE — Progress Notes (Signed)
Pt is here for ROB visit. G1P0 [redacted]w[redacted]d.

## 2018-07-03 ENCOUNTER — Telehealth: Payer: Self-pay

## 2018-07-03 NOTE — Telephone Encounter (Signed)
Pt called. Voicemail left

## 2018-07-04 ENCOUNTER — Encounter: Payer: Self-pay | Admitting: Advanced Practice Midwife

## 2018-07-04 ENCOUNTER — Ambulatory Visit (INDEPENDENT_AMBULATORY_CARE_PROVIDER_SITE_OTHER): Payer: Medicaid Other | Admitting: Advanced Practice Midwife

## 2018-07-04 VITALS — BP 113/70 | HR 96 | Wt 182.0 lb

## 2018-07-04 DIAGNOSIS — G8929 Other chronic pain: Secondary | ICD-10-CM

## 2018-07-04 DIAGNOSIS — M549 Dorsalgia, unspecified: Secondary | ICD-10-CM

## 2018-07-04 DIAGNOSIS — O1203 Gestational edema, third trimester: Secondary | ICD-10-CM

## 2018-07-04 DIAGNOSIS — O9989 Other specified diseases and conditions complicating pregnancy, childbirth and the puerperium: Secondary | ICD-10-CM

## 2018-07-04 DIAGNOSIS — Z23 Encounter for immunization: Secondary | ICD-10-CM | POA: Diagnosis not present

## 2018-07-04 DIAGNOSIS — Z3403 Encounter for supervision of normal first pregnancy, third trimester: Secondary | ICD-10-CM

## 2018-07-04 DIAGNOSIS — M545 Low back pain: Secondary | ICD-10-CM

## 2018-07-04 MED ORDER — COMFORT FIT MATERNITY SUPP SM MISC: 0 refills | Status: DC

## 2018-07-04 NOTE — Progress Notes (Signed)
   PRENATAL VISIT NOTE  Subjective:  Dana Flores is a 17 y.o. G1P0000 at 7338w1d being seen today for ongoing prenatal care.  She is currently monitored for the following issues for this low-risk pregnancy and has Supervision of normal first pregnancy, antepartum; History of umbilical hernia repair; and Obesity (BMI 30.0-34.9) on their problem list.  Patient reports backache and swelling in legs/feet, difficulty with walking so much at work. 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  Contractions: Irritability. Vag. Bleeding: None.  Movement: Present. Denies leaking of fluid.   The following portions of the patient's history were reviewed and updated as appropriate: allergies, current medications, past family history, past medical history, past social history, past surgical history and problem list. Problem list updated.  Objective:   Vitals:   07/04/18 0931  BP: 113/70  Pulse: 96  Weight: 82.6 kg    Fetal Status: Fetal Heart Rate (bpm): 142   Movement: Present     General:  Alert, oriented and cooperative. Patient is in no acute distress.  Skin: Skin is warm and dry. No rash noted.   Cardiovascular: Normal heart rate noted  Respiratory: Normal respiratory effort, no problems with respiration noted  Abdomen: Soft, gravid, appropriate for gestational age.  Pain/Pressure: Present     Pelvic: Cervical exam deferred        Extremities: Normal range of motion.  Edema: None  Mental Status: Normal mood and affect. Normal behavior. Normal judgment and thought content.   Assessment and Plan:  Pregnancy: G1P0000 at 5538w1d  1. Encounter for supervision of normal first pregnancy in third trimester --Anticipatory guidance about next visits/weeks of pregnancy given.  2. Need for Tdap vaccination  - Tdap vaccine greater than or equal to 7yo IM  3. Flu vaccine need  - Flu Vaccine QUAD 36+ mos IM  4. Back pain  affecting pregnancy in third trimester --Pain is constant, worse when standing or walking. It is in center of low back. --Urine dip and culture collected today.  No CVA tenderness on exam. --Pt has pregnancy support belt but it is too large so she is not wearing it. --New Rx written for support belt, pt to get smaller size.  Also reviewed available more comfortable belts online if pt desires to purchase. --Rest/ice/heat/warm bath/Tylenol/pregnancy support belt. --Pt has job with lots of standing, and she is far from restroom for bathroom breaks. She also walks up flight of stairs from one level of parking lot to get into building.   --Filled out accommodations paperwork from pt job today and filled out request for temporary handicap parking pass with  [employer] to help pt park closer at work.   --Pt to pick up paperwork and new Rx for pregnancy belt in office at her convenience.  5. Edema during pregnancy in third trimester --Trace BLE today, reports it is more when standing at work.  Recommend support socks while working. BP wnl today.   Preterm labor symptoms and general obstetric precautions including but not limited to vaginal bleeding, contractions, leaking of fluid and fetal movement were reviewed in detail with the patient. Please refer to After Visit Summary for other counseling recommendations.  Return in about 2 weeks (around 07/18/2018).  Future Appointments  Date Time Provider Department Center  07/19/2018 11:15 AM Sharyon Cableogers, Veronica C, CNM [facility] None    Sharen CounterLisa Leftwich-Kirby, CNM

## 2018-07-04 NOTE — Patient Instructions (Signed)
Third Trimester of Pregnancy The third trimester is from week 28 through week 40 (months 7 through 9). The third trimester is a time when the unborn baby (fetus) is growing rapidly. At the end of the ninth month, the fetus is about 20 inches in length and weighs 6-10 pounds. Body changes during your third trimester Your body will continue to go through many changes during pregnancy. The changes vary from woman to woman. During the third trimester:  Your weight will continue to increase. You can expect to gain 25-35 pounds (11-16 kg) by the end of the pregnancy.  You may begin to get stretch marks on your hips, abdomen, and breasts.  You may urinate more often because the fetus is moving lower into your pelvis and pressing on your bladder.  You may develop or continue to have heartburn. This is caused by increased hormones that slow down muscles in the digestive tract.  You may develop or continue to have constipation because increased hormones slow digestion and cause the muscles that push waste through your intestines to relax.  You may develop hemorrhoids. These are swollen veins (varicose veins) in the rectum that can itch or be painful.  You may develop swollen, bulging veins (varicose veins) in your legs.  You may have increased body aches in the pelvis, back, or thighs. This is due to weight gain and increased hormones that are relaxing your joints.  You may have changes in your hair. These can include thickening of your hair, rapid growth, and changes in texture. Some women also have hair loss during or after pregnancy, or hair that feels dry or thin. Your hair will most likely return to normal after your baby is born.  Your breasts will continue to grow and they will continue to become tender. A yellow fluid (colostrum) may leak from your breasts. This is the first milk you are producing for your baby.  Your belly button may stick out.  You may notice more swelling in your hands,  face, or ankles.  You may have increased tingling or numbness in your hands, arms, and legs. The skin on your belly may also feel numb.  You may feel short of breath because of your expanding uterus.  You may have more problems sleeping. This can be caused by the size of your belly, increased need to urinate, and an increase in your body's metabolism.  You may notice the fetus "dropping," or moving lower in your abdomen (lightening).  You may have increased vaginal discharge.  You may notice your joints feel loose and you may have pain around your pelvic bone.  What to expect at prenatal visits You will have prenatal exams every 2 weeks until week 36. Then you will have weekly prenatal exams. During a routine prenatal visit:  You will be weighed to make sure you and the baby are growing normally.  Your blood pressure will be taken.  Your abdomen will be measured to track your baby's growth.  The fetal heartbeat will be listened to.  Any test results from the previous visit will be discussed.  You may have a cervical check near your due date to see if your cervix has softened or thinned (effaced).  You will be tested for Group B streptococcus. This happens between 35 and 37 weeks.  Your health care provider may ask you:  What your birth plan is.  How you are feeling.  If you are feeling the baby move.  If you have had   any abnormal symptoms, such as leaking fluid, bleeding, severe headaches, or abdominal cramping.  If you are using any tobacco products, including cigarettes, chewing tobacco, and electronic cigarettes.  If you have any questions.  Other tests or screenings that may be performed during your third trimester include:  Blood tests that check for low iron levels (anemia).  Fetal testing to check the health, activity level, and growth of the fetus. Testing is done if you have certain medical conditions or if there are problems during the  pregnancy.  Nonstress test (NST). This test checks the health of your baby to make sure there are no signs of problems, such as the baby not getting enough oxygen. During this test, a belt is placed around your belly. The baby is made to move, and its heart rate is monitored during movement.  What is false labor? False labor is a condition in which you feel small, irregular tightenings of the muscles in the womb (contractions) that usually go away with rest, changing position, or drinking water. These are called Braxton Hicks contractions. Contractions may last for hours, days, or even weeks before true labor sets in. If contractions come at regular intervals, become more frequent, increase in intensity, or become painful, you should see your health care provider. What are the signs of labor?  Abdominal cramps.  Regular contractions that start at 10 minutes apart and become stronger and more frequent with time.  Contractions that start on the top of the uterus and spread down to the lower abdomen and back.  Increased pelvic pressure and dull back pain.  A watery or bloody mucus discharge that comes from the vagina.  Leaking of amniotic fluid. This is also known as your "water breaking." It could be a slow trickle or a gush. Let your health care provider know if it has a color or strange odor. If you have any of these signs, call your health care provider right away, even if it is before your due date. Follow these instructions at home: Medicines  Follow your health care provider's instructions regarding medicine use. Specific medicines may be either safe or unsafe to take during pregnancy.  Take a prenatal vitamin that contains at least 600 micrograms (mcg) of folic acid.  If you develop constipation, try taking a stool softener if your health care provider approves. Eating and drinking  Eat a balanced diet that includes fresh fruits and vegetables, whole grains, good sources of protein  such as meat, eggs, or tofu, and low-fat dairy. Your health care provider will help you determine the amount of weight gain that is right for you.  Avoid raw meat and uncooked cheese. These carry germs that can cause birth defects in the baby.  If you have low calcium intake from food, talk to your health care provider about whether you should take a daily calcium supplement.  Eat four or five small meals rather than three large meals a day.  Limit foods that are high in fat and processed sugars, such as fried and sweet foods.  To prevent constipation: ? Drink enough fluid to keep your urine clear or pale yellow. ? Eat foods that are high in fiber, such as fresh fruits and vegetables, whole grains, and beans. Activity  Exercise only as directed by your health care provider. Most women can continue their usual exercise routine during pregnancy. Try to exercise for 30 minutes at least 5 days a week. Stop exercising if you experience uterine contractions.  Avoid heavy   lifting.  Do not exercise in extreme heat or humidity, or at high altitudes.  Wear low-heel, comfortable shoes.  Practice good posture.  You may continue to have sex unless your health care provider tells you otherwise. Relieving pain and discomfort  Take frequent breaks and rest with your legs elevated if you have leg cramps or low back pain.  Take warm sitz baths to soothe any pain or discomfort caused by hemorrhoids. Use hemorrhoid cream if your health care provider approves.  Wear a good support bra to prevent discomfort from breast tenderness.  If you develop varicose veins: ? Wear support pantyhose or compression stockings as told by your healthcare provider. ? Elevate your feet for 15 minutes, 3-4 times a day. Prenatal care  Write down your questions. Take them to your prenatal visits.  Keep all your prenatal visits as told by your health care provider. This is important. Safety  Wear your seat belt at  all times when driving.  Make a list of emergency phone numbers, including numbers for family, friends, the hospital, and police and fire departments. General instructions  Avoid cat litter boxes and soil used by cats. These carry germs that can cause birth defects in the baby. If you have a cat, ask someone to clean the litter box for you.  Do not travel far distances unless it is absolutely necessary and only with the approval of your health care provider.  Do not use hot tubs, steam rooms, or saunas.  Do not drink alcohol.  Do not use any products that contain nicotine or tobacco, such as cigarettes and e-cigarettes. If you need help quitting, ask your health care provider.  Do not use any medicinal herbs or unprescribed drugs. These chemicals affect the formation and growth of the baby.  Do not douche or use tampons or scented sanitary pads.  Do not cross your legs for long periods of time.  To prepare for the arrival of your baby: ? Take prenatal classes to understand, practice, and ask questions about labor and delivery. ? Make a trial run to the hospital. ? Visit the hospital and tour the maternity area. ? Arrange for maternity or paternity leave through employers. ? Arrange for family and friends to take care of pets while you are in the hospital. ? Purchase a rear-facing car seat and make sure you know how to install it in your car. ? Pack your hospital bag. ? Prepare the baby's nursery. Make sure to remove all pillows and stuffed animals from the baby's crib to prevent suffocation.  Visit your dentist if you have not gone during your pregnancy. Use a soft toothbrush to brush your teeth and be gentle when you floss. Contact a health care provider if:  You are unsure if you are in labor or if your water has broken.  You become dizzy.  You have mild pelvic cramps, pelvic pressure, or nagging pain in your abdominal area.  You have lower back pain.  You have persistent  nausea, vomiting, or diarrhea.  You have an unusual or bad smelling vaginal discharge.  You have pain when you urinate. Get help right away if:  Your water breaks before 37 weeks.  You have regular contractions less than 5 minutes apart before 37 weeks.  You have a fever.  You are leaking fluid from your vagina.  You have spotting or bleeding from your vagina.  You have severe abdominal pain or cramping.  You have rapid weight loss or weight gain.    You have shortness of breath with chest pain.  You notice sudden or extreme swelling of your face, hands, ankles, feet, or legs.  Your baby makes fewer than 10 movements in 2 hours.  You have severe headaches that do not go away when you take medicine.  You have vision changes. Summary  The third trimester is from week 28 through week 40, months 7 through 9. The third trimester is a time when the unborn baby (fetus) is growing rapidly.  During the third trimester, your discomfort may increase as you and your baby continue to gain weight. You may have abdominal, leg, and back pain, sleeping problems, and an increased need to urinate.  During the third trimester your breasts will keep growing and they will continue to become tender. A yellow fluid (colostrum) may leak from your breasts. This is the first milk you are producing for your baby.  False labor is a condition in which you feel small, irregular tightenings of the muscles in the womb (contractions) that eventually go away. These are called Braxton Hicks contractions. Contractions may last for hours, days, or even weeks before true labor sets in.  Signs of labor can include: abdominal cramps; regular contractions that start at 10 minutes apart and become stronger and more frequent with time; watery or bloody mucus discharge that comes from the vagina; increased pelvic pressure and dull back pain; and leaking of amniotic fluid. This information is not intended to replace advice  given to you by your health care provider. Make sure you discuss any questions you have with your health care provider. Document Released: 07/26/2001 Document Revised: 01/07/2016 Document Reviewed: 10/02/2012 Elsevier Interactive Patient Education  2017 [employer]  

## 2018-07-04 NOTE — Addendum Note (Signed)
Addended by: Sharen CounterLEFTWICH-KIRBY, Mancel Lardizabal A on: 07/04/2018 11:38 AM   Modules accepted: Orders

## 2018-07-07 LAB — URINE CULTURE, OB REFLEX

## 2018-07-07 LAB — CULTURE, OB URINE

## 2018-07-19 ENCOUNTER — Ambulatory Visit (INDEPENDENT_AMBULATORY_CARE_PROVIDER_SITE_OTHER): Payer: Medicaid Other | Admitting: Certified Nurse Midwife

## 2018-07-19 ENCOUNTER — Encounter: Payer: Self-pay | Admitting: Obstetrics

## 2018-07-19 ENCOUNTER — Encounter: Payer: Self-pay | Admitting: Certified Nurse Midwife

## 2018-07-19 VITALS — BP 103/67 | HR 89 | Wt 181.2 lb

## 2018-07-19 DIAGNOSIS — E669 Obesity, unspecified: Secondary | ICD-10-CM

## 2018-07-19 DIAGNOSIS — Z3A35 35 weeks gestation of pregnancy: Secondary | ICD-10-CM

## 2018-07-19 DIAGNOSIS — Z3403 Encounter for supervision of normal first pregnancy, third trimester: Secondary | ICD-10-CM

## 2018-07-19 DIAGNOSIS — Z3400 Encounter for supervision of normal first pregnancy, unspecified trimester: Secondary | ICD-10-CM

## 2018-07-19 NOTE — Progress Notes (Signed)
   PRENATAL VISIT NOTE  Subjective:  Dana Flores is a 17 y.o. G1P0000 at 211w2d being seen today for ongoing prenatal care.  She is currently monitored for the following issues for this low-risk pregnancy and has Supervision of normal first pregnancy, antepartum; History of umbilical hernia repair; and Obesity (BMI 30.0-34.9) on their problem list.  Patient reports no complaints.  Contractions: Irregular. Vag. Bleeding: None.  Movement: Present. Denies leaking of fluid.   The following portions of the patient's history were reviewed and updated as appropriate: allergies, current medications, past family history, past medical history, past social history, past surgical history and problem list. Problem list updated.  Objective:   Vitals:   07/19/18 1120  BP: 103/67  Pulse: 89  Weight: 181 lb 3.2 oz (82.2 kg)    Fetal Status: Fetal Heart Rate (bpm): 145 Fundal Height: 37 cm Movement: Present     General:  Alert, oriented and cooperative. Patient is in no acute distress.  Skin: Skin is warm and dry. No rash noted.   Cardiovascular: Normal heart rate noted  Respiratory: Normal respiratory effort, no problems with respiration noted  Abdomen: Soft, gravid, appropriate for gestational age.  Pain/Pressure: Present     Pelvic: Cervical exam deferred        Extremities: Normal range of motion.  Edema: None  Mental Status: Normal mood and affect. Normal behavior. Normal judgment and thought content.   Assessment and Plan:  Pregnancy: G1P0000 at 301w2d  1. Supervision of normal first pregnancy, antepartum - Patient doing well, no complaints  - Anticipatory guidance on upcoming appointments  - US appointment made to complete anatomy that was not completed and fetal growth assessment  - US MFM OB FOLLOW UP; Future  2. Obesity (BMI 30.0-34.9) -TWG 21lbs this pregnancy    Preterm labor symptoms and general obstetric precautions including but not limited to vaginal bleeding, contractions,  leaking of fluid and fetal movement were reviewed in detail with the patient. Please refer to After Visit Summary for other counseling recommendations.  Return in about 1 week (around 07/26/2018) for ROB.  Future Appointments  Date Time Provider Department Center  07/26/2018  9:45 AM [facility] US 5 [facility]  07/26/2018 10:45 AM Sharyon Cableogers, Shelma Eiben C, CNM [facility] None    Sharyon CableVeronica C Khan Chura, CNM

## 2018-07-19 NOTE — Progress Notes (Signed)
Pt presents for ROB.Pt has no concerns. 

## 2018-07-19 NOTE — Patient Instructions (Signed)

## 2018-07-26 ENCOUNTER — Encounter (HOSPITAL_COMMUNITY): Payer: Self-pay

## 2018-07-26 ENCOUNTER — Ambulatory Visit (HOSPITAL_COMMUNITY)
Admission: RE | Admit: 2018-07-26 | Discharge: 2018-07-26 | Disposition: A | Discharge status: Home / Self Care | Payer: Medicaid Other | Source: Ambulatory Visit | Attending: Certified Nurse Midwife | Admitting: Certified Nurse Midwife

## 2018-07-26 ENCOUNTER — Encounter: Payer: Self-pay | Admitting: Certified Nurse Midwife

## 2018-07-26 ENCOUNTER — Other Ambulatory Visit (HOSPITAL_COMMUNITY)
Admission: RE | Admit: 2018-07-26 | Discharge: 2018-07-26 | Disposition: A | Discharge status: Home / Self Care | Payer: Medicaid Other | Source: Ambulatory Visit | Attending: Certified Nurse Midwife | Admitting: Certified Nurse Midwife

## 2018-07-26 ENCOUNTER — Ambulatory Visit (INDEPENDENT_AMBULATORY_CARE_PROVIDER_SITE_OTHER): Payer: Medicaid Other | Admitting: Certified Nurse Midwife

## 2018-07-26 VITALS — BP 120/70 | HR 98 | Wt 182.3 lb

## 2018-07-26 DIAGNOSIS — A749 Chlamydial infection, unspecified: Secondary | ICD-10-CM

## 2018-07-26 DIAGNOSIS — O99213 Obesity complicating pregnancy, third trimester: Secondary | ICD-10-CM | POA: Diagnosis not present

## 2018-07-26 DIAGNOSIS — Z3A36 36 weeks gestation of pregnancy: Secondary | ICD-10-CM | POA: Diagnosis not present

## 2018-07-26 DIAGNOSIS — E669 Obesity, unspecified: Secondary | ICD-10-CM

## 2018-07-26 DIAGNOSIS — Z363 Encounter for antenatal screening for malformations: Secondary | ICD-10-CM | POA: Diagnosis not present

## 2018-07-26 DIAGNOSIS — O99212 Obesity complicating pregnancy, second trimester: Secondary | ICD-10-CM

## 2018-07-26 DIAGNOSIS — Z3403 Encounter for supervision of normal first pregnancy, third trimester: Secondary | ICD-10-CM

## 2018-07-26 DIAGNOSIS — Z3400 Encounter for supervision of normal first pregnancy, unspecified trimester: Secondary | ICD-10-CM

## 2018-07-26 DIAGNOSIS — O98813 Other maternal infectious and parasitic diseases complicating pregnancy, third trimester: Secondary | ICD-10-CM

## 2018-07-26 LAB — OB RESULTS CONSOLE GC/CHLAMYDIA: Gonorrhea: NEGATIVE

## 2018-07-26 NOTE — Progress Notes (Signed)
Pt is here for ROB. G1P0 3531w2d.

## 2018-07-26 NOTE — Progress Notes (Signed)
   PRENATAL VISIT NOTE  Subjective:  Dana Flores is a 17 y.o. G1P0000 at 3447w2d being seen today for ongoing prenatal care.  She is currently monitored for the following issues for this low-risk pregnancy and has Supervision of normal first pregnancy, antepartum; History of umbilical hernia repair; and Obesity (BMI 30.0-34.9) on their problem list.  Patient reports no complaints.  Contractions: Irregular. Vag. Bleeding: None.  Movement: Present. Denies leaking of fluid.   The following portions of the patient's history were reviewed and updated as appropriate: allergies, current medications, past family history, past medical history, past social history, past surgical history and problem list. Problem list updated.  Objective:   Vitals:   07/26/18 1122  BP: 120/70  Pulse: 98  Weight: 182 lb 4.8 oz (82.7 kg)    Fetal Status: Fetal Heart Rate (bpm): 140 Fundal Height: 37 cm Movement: Present     General:  Alert, oriented and cooperative. Patient is in no acute distress.  Skin: Skin is warm and dry. No rash noted.   Cardiovascular: Normal heart rate noted  Respiratory: Normal respiratory effort, no problems with respiration noted  Abdomen: Soft, gravid, appropriate for gestational age.  Pain/Pressure: Present     Pelvic: Cervical exam deferred      Patient declines cervical examination at this time   Extremities: Normal range of motion.  Edema: Trace  Mental Status: Normal mood and affect. Normal behavior. Normal judgment and thought content.   Assessment and Plan:  Pregnancy: G1P0000 at 3147w2d  1. 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 Patient "tired of working and  it hurts to walk, but sits down majority of time at work".   - Decision made to cut down hours at work instead of taking FMLA/maternity leave at this time.  - Strep Gp B NAA - Cervicovaginal ancillary only( [place])  2. Obesity (BMI 30.0-34.9) - TWG 22lb this pregnancy  - Follow up US for fetal growth completed today, EFW  3057gm (6lb 12oz) 77% at 4939w4d  Term labor symptoms and general obstetric precautions including but not limited to vaginal bleeding, contractions, leaking of fluid and fetal movement were reviewed in detail with the patient. Please refer to After Visit Summary for other counseling recommendations.  Return in about 1 week (around 08/02/2018) for ROB.  Future Appointments  Date Time Provider Department Center  08/01/2018 10:30 AM Dana Flores, Dana Flores, CNM [facility] None    Dana Flores, CNM

## 2018-07-26 NOTE — Patient Instructions (Signed)

## 2018-07-27 LAB — CERVICOVAGINAL ANCILLARY ONLY
Chlamydia: POSITIVE — AB
Neisseria Gonorrhea: NEGATIVE

## 2018-07-27 NOTE — Progress Notes (Signed)
Subjective: Dana Flores is a G1P0000 at 3238w3d [facility]  She does not have a history of any mental health concerns. She is not currently sexually active. She is currently using no method for birth control. She has had recent STD screening on 6/01/2019and was tested for GC/Chlamydia Patient states father of baby and family as her support system.   BP 120/70   Pulse 98   Wt 182 lb 4.8 oz (82.7 kg)   LMP  (LMP Unknown)   BMI 35.60 kg/m   Birth Control History:  Pills  MDM Patient counseled on all options for birth control today including LARC. Patient desires OCP initiated for birth control.   Assessment:  17 y.o. female considering OCP for birth control  Plan: [facility]. Paperwork turned in to front desk staff  Dana Flores, Dana Picinich, LCSW 07/27/2018 11:00 AM

## 2018-07-28 LAB — STREP GP B NAA: Strep Gp B NAA: POSITIVE — AB

## 2018-07-30 DIAGNOSIS — O98813 Other maternal infectious and parasitic diseases complicating pregnancy, third trimester: Secondary | ICD-10-CM

## 2018-07-30 DIAGNOSIS — A749 Chlamydial infection, unspecified: Secondary | ICD-10-CM | POA: Insufficient documentation

## 2018-07-30 MED ORDER — AZITHROMYCIN 500 MG PO TABS: 1000.0000 mg | ORAL_TABLET | Freq: Once | ORAL | 0 refills | Status: AC

## 2018-07-30 NOTE — Addendum Note (Signed)
Addended by: Sharyon CableOGERS, Victorya Hillman C on: 07/30/2018 07:18 AM   Modules accepted: Orders

## 2018-08-01 ENCOUNTER — Encounter: Payer: Self-pay | Admitting: Obstetrics

## 2018-08-01 ENCOUNTER — Encounter: Payer: Self-pay | Admitting: Certified Nurse Midwife

## 2018-08-01 ENCOUNTER — Ambulatory Visit (INDEPENDENT_AMBULATORY_CARE_PROVIDER_SITE_OTHER): Payer: Medicaid Other | Admitting: Certified Nurse Midwife

## 2018-08-01 VITALS — BP 120/70 | HR 108 | Wt 182.0 lb

## 2018-08-01 DIAGNOSIS — E669 Obesity, unspecified: Secondary | ICD-10-CM

## 2018-08-01 DIAGNOSIS — A749 Chlamydial infection, unspecified: Secondary | ICD-10-CM

## 2018-08-01 DIAGNOSIS — O98813 Other maternal infectious and parasitic diseases complicating pregnancy, third trimester: Secondary | ICD-10-CM

## 2018-08-01 DIAGNOSIS — Z3400 Encounter for supervision of normal first pregnancy, unspecified trimester: Secondary | ICD-10-CM

## 2018-08-01 DIAGNOSIS — Z3403 Encounter for supervision of normal first pregnancy, third trimester: Secondary | ICD-10-CM

## 2018-08-01 NOTE — Patient Instructions (Signed)
Reasons to go to MAU:  1.  Contractions are  3-4 minutes apart or less, each last 1 minute, these have been going on for 1-2 hours, and you cannot walk or talk during them 2.  You have a large gush of fluid, or a trickle of fluid that will not stop and you have to wear a pad 3.  You have bleeding that is bright red, heavier than spotting--like menstrual bleeding (spotting can be normal in early labor or after a check of your cervix) 4.  You do not feel the baby moving like he/she normally does  

## 2018-08-01 NOTE — Progress Notes (Signed)
   PRENATAL VISIT NOTE  Subjective:  Dana Flores is a 17 y.o. G1P0000 at 1552w1d being seen today for ongoing prenatal care.  She is currently monitored for the following issues for this low-risk pregnancy and has Supervision of normal first pregnancy, antepartum; History of umbilical hernia repair; Obesity (BMI 30.0-34.9); and Chlamydia infection affecting pregnancy in third trimester on their problem list.  Patient reports hip and pelvic pain.  Contractions: Irregular. Vag. Bleeding: None.  Movement: Present. Denies leaking of fluid.   The following portions of the patient's history were reviewed and updated as appropriate: allergies, current medications, past family history, past medical history, past social history, past surgical history and problem list. Problem list updated.  Objective:   Vitals:   08/01/18 1044  BP: 120/70  Pulse: (!) 108  Weight: 182 lb (82.6 kg)    Fetal Status: Fetal Heart Rate (bpm): 140 Fundal Height: 35 cm Movement: Present     General:  Alert, oriented and cooperative. Patient is in no acute distress.  Skin: Skin is warm and dry. No rash noted.   Cardiovascular: Normal heart rate noted  Respiratory: Normal respiratory effort, no problems with respiration noted  Abdomen: Soft, gravid, appropriate for gestational age.  Pain/Pressure: Present     Pelvic: Cervical exam deferred       Patient declined cervical examination  Extremities: Normal range of motion.  Edema: Trace  Mental Status: Normal mood and affect. Normal behavior. Normal judgment and thought content.   Assessment and Plan:  Pregnancy: G1P0000 at 2352w1d  1. Supervision of normal first pregnancy, antepartum - Patient reports hip and pelvic pain- declines cervical examination, denies contractions- reports occasional tightening that is not painful  - reports pain increases when she is at work due to position she sits in and when she is up walking for breaks  - Request decrease in work schedule,  currently works 9 hours per shift, Educated and discussed decreasing shift to approx 5 hours, using support belt while at work, and taking frequent bathroom breaks to relieve pelvic pressure/pain due to fetal position - Anticipatory guidance on upcoming appointments  - Labor precautions and reasons to go to [facility]U for evaluation discussed   2. Chlamydia infection affecting pregnancy in third trimester - +CH on 12/12, treated on 12/16  - TOC needed in 4 weeks or prior to delivery  3. Obesity (BMI 30.0-34.9) - TWG 22 lbs during pregnancy  Term labor symptoms and general obstetric precautions including but not limited to vaginal bleeding, contractions, leaking of fluid and fetal movement were reviewed in detail with the patient. Please refer to After Visit Summary for other counseling recommendations.  Return in about 8 days (around 08/09/2018) for ROB.  Future Appointments  Date Time Provider Department Center  08/09/2018  8:15 AM Sharyon Cableogers, Santana Gosdin C, CNM [facility] None    Sharyon CableVeronica C Shemeca Lukasik, CNM

## 2018-08-09 ENCOUNTER — Encounter: Payer: Self-pay | Admitting: Certified Nurse Midwife

## 2018-08-09 ENCOUNTER — Ambulatory Visit (INDEPENDENT_AMBULATORY_CARE_PROVIDER_SITE_OTHER): Payer: Medicaid Other | Admitting: Certified Nurse Midwife

## 2018-08-09 VITALS — BP 117/76 | HR 105 | Wt 184.2 lb

## 2018-08-09 DIAGNOSIS — Z3400 Encounter for supervision of normal first pregnancy, unspecified trimester: Secondary | ICD-10-CM

## 2018-08-09 DIAGNOSIS — O98813 Other maternal infectious and parasitic diseases complicating pregnancy, third trimester: Secondary | ICD-10-CM

## 2018-08-09 DIAGNOSIS — Z3403 Encounter for supervision of normal first pregnancy, third trimester: Secondary | ICD-10-CM

## 2018-08-09 DIAGNOSIS — A749 Chlamydial infection, unspecified: Secondary | ICD-10-CM

## 2018-08-09 MED ORDER — AZITHROMYCIN 500 MG PO TABS: 1000.0000 mg | ORAL_TABLET | Freq: Once | ORAL | 0 refills | Status: AC

## 2018-08-09 NOTE — Progress Notes (Signed)
Pt is here for ROB [redacted]w[redacted]d.  

## 2018-08-09 NOTE — Progress Notes (Signed)
   PRENATAL VISIT NOTE  Subjective:  Dana Flores is a 17 y.o. G1P0000 at 2048w2d being seen today for ongoing prenatal care.  She is currently monitored for the following issues for this low-risk pregnancy and has Supervision of normal first pregnancy, antepartum; History of umbilical hernia repair; Obesity (BMI 30.0-34.9); and Chlamydia infection affecting pregnancy in third trimester on their problem list.  Patient reports no complaints.  Contractions: Irregular. Vag. Bleeding: None.  Movement: Present. Denies leaking of fluid.   The following portions of the patient's history were reviewed and updated as appropriate: allergies, current medications, past family history, past medical history, past social history, past surgical history and problem list. Problem list updated.  Objective:   Vitals:   08/09/18 0831  BP: 117/76  Pulse: (!) 105  Weight: 184 lb 3.2 oz (83.6 kg)    Fetal Status: Fetal Heart Rate (bpm): 139 Fundal Height: 36 cm Movement: Present  Presentation: Vertex  General:  Alert, oriented and cooperative. Patient is in no acute distress.  Skin: Skin is warm and dry. No rash noted.   Cardiovascular: Normal heart rate noted  Respiratory: Normal respiratory effort, no problems with respiration noted  Abdomen: Soft, gravid, appropriate for gestational age.  Pain/Pressure: Present     Pelvic: Cervical exam performed Dilation: Closed Effacement (%): Thick Station: -3  Extremities: Normal range of motion.  Edema: Trace  Mental Status: Normal mood and affect. Normal behavior. Normal judgment and thought content.   Assessment and Plan:  Pregnancy: G1P0000 at 3948w2d  1. Supervision of normal first pregnancy, antepartum - Patient doing well, no complaints  - Wants to be induced on 08/21/18- educated and discussed IOL for G1P0 with unfavorable cervix will make induction process longer and harder. Discussed recommendations of IOL closer to 41 weeks if no spontaneous labor.  -  Recommend EPO and RRT to induce contractions   2. Chlamydia infection affecting pregnancy in third trimester - Patient reports not taking medication that was initially sent  - Request prescription to be resent to pharmacy  - Educated and discussed importance of taking medication and not having intercourse for 10 days after completion of medication by patient and partner, patient verbalizes understanding  - TOC needed at time of delivery  - azithromycin (ZITHROMAX) 500 MG tablet; Take 2 tablets (1,000 mg total) by mouth once for 1 dose.  Dispense: 2 tablet; Refill: 0  Term labor symptoms and general obstetric precautions including but not limited to vaginal bleeding, contractions, leaking of fluid and fetal movement were reviewed in detail with the patient. Please refer to After Visit Summary for other counseling recommendations.  Return in about 1 week (around 08/16/2018) for ROB.  Future Appointments  Date Time Provider Department Center  08/20/2018  3:45 PM Leftwich-Kirby, Wilmer FloorLisa A, CNM [facility] None    Sharyon CableVeronica C Dax Murguia, CNM

## 2018-08-09 NOTE — Patient Instructions (Signed)
Reasons to go to MAU:  1.  Contractions are  3-4 minutes apart or less, each last 1 minute, these have been going on for 1-2 hours, and you cannot walk or talk during them 2.  You have a large gush of fluid, or a trickle of fluid that will not stop and you have to wear a pad 3.  You have bleeding that is bright red, heavier than spotting--like menstrual bleeding (spotting can be normal in early labor or after a check of your cervix) 4.  You do not feel the baby moving like he/she normally does  

## 2018-08-15 NOTE — L&D Delivery Note (Addendum)
Delivery Note At 11:24 PM a viable and healthy female was delivered via Vaginal, Spontaneous (Presentation: OA; Cephalic  ).  APGAR: 9, 9; Placenta status: spontaneous, intact .  Cord: 3 vessel with no complications Nuchal x1, easily reduced via somersault   Anesthesia:  epidural Episiotomy: None Lacerations: 1st degree labial Suture Repair: well approximated and hemostasis achieved with pressure so no sutures placed Est. Blood Loss (mL):  136  Mom to postpartum.  Baby to Couplet care / Skin to Skin.  Myrene Buddy 08/21/2018, 11:46 PM  I confirm that I have verified the information documented in the resident's note and that I have also personally reperformed the physical exam and all medical decision making activities.  I was gloved and present for entire delivery SVD without incident No difficulty with shoulders Lacerations as listed above Repair not necessary, laceration is hemostatic Aviva Signs, CNM  Please schedule this patient for Postpartum visit in: 4 weeks with the following provider: Any provider For C/S patients schedule nurse incision check in weeks 2 weeks: no Low risk pregnancy complicated by: Chlamydia Delivery mode:  SVD Anticipated Birth Control:  other/unsure PP Procedures needed: none  Schedule Integrated BH visit: no

## 2018-08-20 ENCOUNTER — Other Ambulatory Visit (HOSPITAL_COMMUNITY)
Admission: RE | Admit: 2018-08-20 | Discharge: 2018-08-20 | Disposition: A | Discharge status: Home / Self Care | Payer: Medicaid Other | Source: Ambulatory Visit | Attending: Advanced Practice Midwife | Admitting: Advanced Practice Midwife

## 2018-08-20 ENCOUNTER — Ambulatory Visit (INDEPENDENT_AMBULATORY_CARE_PROVIDER_SITE_OTHER): Payer: Medicaid Other | Admitting: Advanced Practice Midwife

## 2018-08-20 ENCOUNTER — Encounter: Payer: Self-pay | Admitting: Advanced Practice Midwife

## 2018-08-20 VITALS — BP 116/78 | HR 94 | Wt 186.0 lb

## 2018-08-20 DIAGNOSIS — Z3A39 39 weeks gestation of pregnancy: Secondary | ICD-10-CM

## 2018-08-20 DIAGNOSIS — A749 Chlamydial infection, unspecified: Secondary | ICD-10-CM

## 2018-08-20 DIAGNOSIS — O98813 Other maternal infectious and parasitic diseases complicating pregnancy, third trimester: Secondary | ICD-10-CM

## 2018-08-20 DIAGNOSIS — Z3403 Encounter for supervision of normal first pregnancy, third trimester: Secondary | ICD-10-CM

## 2018-08-20 DIAGNOSIS — Z3400 Encounter for supervision of normal first pregnancy, unspecified trimester: Secondary | ICD-10-CM

## 2018-08-20 NOTE — Patient Instructions (Signed)

## 2018-08-20 NOTE — Progress Notes (Signed)
Pt wants cervix check.  

## 2018-08-20 NOTE — Addendum Note (Signed)
Addended by: Sharen CounterLEFTWICH-KIRBY, LISA A on: 08/20/2018 05:35 PM   Modules accepted: Orders, SmartSet

## 2018-08-20 NOTE — Progress Notes (Signed)
   PRENATAL VISIT NOTE  Subjective:  Dana Flores is a 18 y.o. G1P0000 at [redacted]w[redacted]d being seen today for ongoing prenatal care.  She is currently monitored for the following issues for this low-risk pregnancy and has Supervision of normal first pregnancy, antepartum; History of umbilical hernia repair; Obesity (BMI 30.0-34.9); and Chlamydia infection affecting pregnancy in third trimester on their problem list.  Patient reports backache.  Contractions: Irritability. Vag. Bleeding: None.  Movement: Present. Denies leaking of fluid.   The following portions of the patient's history were reviewed and updated as appropriate: allergies, current medications, past family history, past medical history, past social history, past surgical history and problem list. Problem list updated.  Objective:   Vitals:   08/20/18 1558  BP: 116/78  Pulse: 94  Weight: 84.4 kg    Fetal Status: Fetal Heart Rate (bpm): 144 Fundal Height: 38 cm Movement: Present  Presentation: Vertex  General:  Alert, oriented and cooperative. Patient is in no acute distress.  Skin: Skin is warm and dry. No rash noted.   Cardiovascular: Normal heart rate noted  Respiratory: Normal respiratory effort, no problems with respiration noted  Abdomen: Soft, gravid, appropriate for gestational age.  Pain/Pressure: Present     Pelvic: Cervical exam performed Dilation: Fingertip Effacement (%): 50 Station: -3  Extremities: Normal range of motion.  Edema: None  Mental Status: Normal mood and affect. Normal behavior. Normal judgment and thought content.   Assessment and Plan:  Pregnancy: G1P0000 at [redacted]w[redacted]d  1. Supervision of normal first pregnancy, antepartum --NST on Friday, IOL for Tuesday 08/28/18 for postdates.  2. Chlamydia infection affecting pregnancy in third trimester --Pt reports she took medication when initially diagnosed 12/12 but her partner may not have been treated. Meds prescribed again on 12/26. She has not taken new Rx.     --Testing today via urine (not a clean catch) and pt to take medication today. --Discussed risks of chlamydia at the time of delivery including blindness.  Discussed treatment of infants with abx eye ointment but chlamydia still comes with risk to mom and baby. Pt states understanding.    --Needs TOC at delivery.   Term labor symptoms and general obstetric precautions including but not limited to vaginal bleeding, contractions, leaking of fluid and fetal movement were reviewed in detail with the patient. Please refer to After Visit Summary for other counseling recommendations.  Return in about 1 week (around 08/27/2018).  Future Appointments  Date Time Provider Department Center  08/24/2018  8:30 AM [facility] NURSE [facility] None  08/27/2018  3:45 PM Constant, Gigi Gin, MD [facility] None  08/28/2018  7:00 AM [facility] None    Sharen Counter, CNM

## 2018-08-21 ENCOUNTER — Inpatient Hospital Stay (HOSPITAL_COMMUNITY): Payer: Medicaid Other | Admitting: Anesthesiology

## 2018-08-21 ENCOUNTER — Telehealth (HOSPITAL_COMMUNITY): Payer: Self-pay | Admitting: *Deleted

## 2018-08-21 ENCOUNTER — Encounter (HOSPITAL_COMMUNITY): Payer: Self-pay | Admitting: *Deleted

## 2018-08-21 ENCOUNTER — Other Ambulatory Visit: Payer: Self-pay

## 2018-08-21 ENCOUNTER — Inpatient Hospital Stay (HOSPITAL_COMMUNITY)
Admission: AD | Admit: 2018-08-21 | Discharge: 2018-08-23 | DRG: 807 | Disposition: A | Discharge status: Home / Self Care | Payer: Medicaid Other | Attending: Obstetrics & Gynecology | Admitting: Obstetrics & Gynecology

## 2018-08-21 DIAGNOSIS — O480 Post-term pregnancy: Secondary | ICD-10-CM | POA: Diagnosis present

## 2018-08-21 DIAGNOSIS — O99824 Streptococcus B carrier state complicating childbirth: Secondary | ICD-10-CM | POA: Diagnosis present

## 2018-08-21 DIAGNOSIS — O368330 Maternal care for abnormalities of the fetal heart rate or rhythm, third trimester, not applicable or unspecified: Secondary | ICD-10-CM | POA: Diagnosis not present

## 2018-08-21 DIAGNOSIS — O9902 Anemia complicating childbirth: Secondary | ICD-10-CM | POA: Diagnosis present

## 2018-08-21 DIAGNOSIS — Z3A40 40 weeks gestation of pregnancy: Secondary | ICD-10-CM

## 2018-08-21 DIAGNOSIS — O99214 Obesity complicating childbirth: Secondary | ICD-10-CM | POA: Diagnosis present

## 2018-08-21 DIAGNOSIS — O368390 Maternal care for abnormalities of the fetal heart rate or rhythm, unspecified trimester, not applicable or unspecified: Secondary | ICD-10-CM | POA: Diagnosis present

## 2018-08-21 DIAGNOSIS — D649 Anemia, unspecified: Secondary | ICD-10-CM | POA: Diagnosis present

## 2018-08-21 DIAGNOSIS — Z3A41 41 weeks gestation of pregnancy: Secondary | ICD-10-CM

## 2018-08-21 LAB — CBC
HCT: 30 % — ABNORMAL LOW (ref 36.0–46.0)
HEMOGLOBIN: 9.3 g/dL — AB (ref 12.0–15.0)
MCH: 23.2 pg — ABNORMAL LOW (ref 26.0–34.0)
MCHC: 31 g/dL (ref 30.0–36.0)
MCV: 74.8 fL — ABNORMAL LOW (ref 80.0–100.0)
Platelets: 198 10*3/uL (ref 150–400)
RBC: 4.01 MIL/uL (ref 3.87–5.11)
RDW: 15.6 % — ABNORMAL HIGH (ref 11.5–15.5)
WBC: 7.6 10*3/uL (ref 4.0–10.5)
nRBC: 0 % (ref 0.0–0.2)

## 2018-08-21 LAB — URINALYSIS, ROUTINE W REFLEX MICROSCOPIC
Bilirubin Urine: NEGATIVE
Glucose, UA: NEGATIVE mg/dL
Hgb urine dipstick: NEGATIVE
Ketones, ur: 80 mg/dL — AB
Nitrite: NEGATIVE
PH: 6 (ref 5.0–8.0)
Protein, ur: NEGATIVE mg/dL
Specific Gravity, Urine: 1.017 (ref 1.005–1.030)

## 2018-08-21 LAB — TYPE AND SCREEN
ABO/RH(D): O POS
Antibody Screen: NEGATIVE

## 2018-08-21 MED ORDER — LACTATED RINGERS IV SOLN
500.0000 mL | INTRAVENOUS | Status: DC | PRN
  Administered 2018-08-21 (×2): 500 mL via INTRAVENOUS

## 2018-08-21 MED ORDER — OXYTOCIN 40 UNITS IN NORMAL SALINE INFUSION - SIMPLE MED
2.5000 [IU]/h | INTRAVENOUS | Status: DC
  Administered 2018-08-21: 2.5 [IU]/h via INTRAVENOUS
  Filled 2018-08-21: qty 1000

## 2018-08-21 MED ORDER — AZITHROMYCIN 500 MG PO TABS
1000.0000 mg | ORAL_TABLET | Freq: Once | ORAL | Status: AC
  Administered 2018-08-21: 1000 mg via ORAL
  Filled 2018-08-21: qty 2

## 2018-08-21 MED ORDER — EPHEDRINE 5 MG/ML INJ
10.0000 mg | INTRAVENOUS | Status: DC | PRN
  Filled 2018-08-21: qty 2

## 2018-08-21 MED ORDER — LACTATED RINGERS IV SOLN: 500.0000 mL | Freq: Once | INTRAVENOUS | Status: DC

## 2018-08-21 MED ORDER — ACETAMINOPHEN 325 MG PO TABS: 650.0000 mg | ORAL_TABLET | ORAL | Status: DC | PRN

## 2018-08-21 MED ORDER — OXYTOCIN BOLUS FROM INFUSION: 500.0000 mL | Freq: Once | INTRAVENOUS | Status: DC

## 2018-08-21 MED ORDER — LACTATED RINGERS IV SOLN: INTRAVENOUS | Status: DC

## 2018-08-21 MED ORDER — FENTANYL 2.5 MCG/ML BUPIVACAINE 1/10 % EPIDURAL INFUSION (WH - ANES)
14.0000 mL/h | INTRAMUSCULAR | Status: DC | PRN
  Administered 2018-08-21: 12 mL/h via EPIDURAL
  Filled 2018-08-21: qty 100

## 2018-08-21 MED ORDER — OXYCODONE-ACETAMINOPHEN 5-325 MG PO TABS: 2.0000 | ORAL_TABLET | ORAL | Status: DC | PRN

## 2018-08-21 MED ORDER — FENTANYL CITRATE (PF) 100 MCG/2ML IJ SOLN
100.0000 ug | INTRAMUSCULAR | Status: DC | PRN
  Administered 2018-08-21 (×3): 100 ug via INTRAVENOUS
  Filled 2018-08-21 (×3): qty 2

## 2018-08-21 MED ORDER — LIDOCAINE HCL (PF) 1 % IJ SOLN: 30.0000 mL | INTRAMUSCULAR | Status: DC | PRN

## 2018-08-21 MED ORDER — ONDANSETRON HCL 4 MG/2ML IJ SOLN: 4.0000 mg | Freq: Four times a day (QID) | INTRAMUSCULAR | Status: DC | PRN

## 2018-08-21 MED ORDER — PHENYLEPHRINE 40 MCG/ML (10ML) SYRINGE FOR IV PUSH (FOR BLOOD PRESSURE SUPPORT)
80.0000 ug | PREFILLED_SYRINGE | INTRAVENOUS | Status: DC | PRN
  Filled 2018-08-21 (×2): qty 10

## 2018-08-21 MED ORDER — OXYTOCIN 40 UNITS IN NORMAL SALINE INFUSION - SIMPLE MED: 2.5000 [IU]/h | INTRAVENOUS | Status: DC

## 2018-08-21 MED ORDER — TERBUTALINE SULFATE 1 MG/ML IJ SOLN
0.2500 mg | Freq: Once | INTRAMUSCULAR | Status: DC | PRN
  Filled 2018-08-21: qty 1

## 2018-08-21 MED ORDER — OXYCODONE-ACETAMINOPHEN 5-325 MG PO TABS: 1.0000 | ORAL_TABLET | ORAL | Status: DC | PRN

## 2018-08-21 MED ORDER — DIPHENHYDRAMINE HCL 50 MG/ML IJ SOLN
12.5000 mg | INTRAMUSCULAR | Status: DC | PRN
  Administered 2018-08-21: 12.5 mg via INTRAVENOUS
  Filled 2018-08-21: qty 1

## 2018-08-21 MED ORDER — OXYTOCIN 40 UNITS IN NORMAL SALINE INFUSION - SIMPLE MED
1.0000 m[IU]/min | INTRAVENOUS | Status: DC
  Administered 2018-08-21: 2 m[IU]/min via INTRAVENOUS

## 2018-08-21 MED ORDER — SODIUM CHLORIDE 0.9 % IV SOLN
5.0000 10*6.[IU] | Freq: Once | INTRAVENOUS | Status: AC
  Administered 2018-08-21: 5 10*6.[IU] via INTRAVENOUS
  Filled 2018-08-21: qty 5

## 2018-08-21 MED ORDER — LACTATED RINGERS IV SOLN: 500.0000 mL | INTRAVENOUS | Status: DC | PRN

## 2018-08-21 MED ORDER — LACTATED RINGERS IV SOLN
INTRAVENOUS | Status: DC
  Administered 2018-08-21 (×3): via INTRAVENOUS

## 2018-08-21 MED ORDER — SOD CITRATE-CITRIC ACID 500-334 MG/5ML PO SOLN: 30.0000 mL | ORAL | Status: DC | PRN

## 2018-08-21 MED ORDER — LIDOCAINE HCL (PF) 1 % IJ SOLN
INTRAMUSCULAR | Status: DC | PRN
  Administered 2018-08-21: 3 mL via EPIDURAL
  Administered 2018-08-21: 5 mL via EPIDURAL
  Administered 2018-08-21: 2 mL via EPIDURAL

## 2018-08-21 MED ORDER — MISOPROSTOL 25 MCG QUARTER TABLET
25.0000 ug | ORAL_TABLET | ORAL | Status: DC | PRN
  Administered 2018-08-21: 25 ug via VAGINAL
  Filled 2018-08-21 (×2): qty 1

## 2018-08-21 MED ORDER — LIDOCAINE HCL (PF) 1 % IJ SOLN
30.0000 mL | INTRAMUSCULAR | Status: DC | PRN
  Filled 2018-08-21: qty 30

## 2018-08-21 MED ORDER — PHENYLEPHRINE 40 MCG/ML (10ML) SYRINGE FOR IV PUSH (FOR BLOOD PRESSURE SUPPORT)
80.0000 ug | PREFILLED_SYRINGE | INTRAVENOUS | Status: DC | PRN
  Filled 2018-08-21: qty 10

## 2018-08-21 MED ORDER — PENICILLIN G 3 MILLION UNITS IVPB - SIMPLE MED
3.0000 10*6.[IU] | INTRAVENOUS | Status: DC
  Administered 2018-08-21 (×2): 3 10*6.[IU] via INTRAVENOUS
  Filled 2018-08-21 (×6): qty 100

## 2018-08-21 NOTE — Progress Notes (Signed)
LABOR PROGRESS NOTE  Dana Flores is a 18 y.o. G1P0000 at [redacted]w[redacted]d  admitted for IOL due to non-reassuring fetal heart tracing.   Subjective: Very uncomfortable during contractions, desires epidural as soon as possible.   Objective: BP 100/63   Pulse 83   Temp 97.8 F (36.6 C) (Oral)   Resp 18   Ht 4\' 11"  (1.499 m)   Wt 83.5 kg   LMP  (LMP Unknown)   SpO2 99%   BMI 37.16 kg/m  or  Vitals:   08/21/18 1049 08/21/18 1207 08/21/18 1349 08/21/18 1435  BP: (!) 99/53 113/61 100/63   Pulse: 79 97 83   Resp: 18     Temp: 98 F (36.7 C)   97.8 F (36.6 C)  TempSrc: Oral   Oral  SpO2:      Weight:      Height:       Dilation: 1 Effacement (%): 70 Cervical Position: Posterior Station: -2, -1 Presentation: Vertex Exam by:: Lorn Junes, RN FHT: baseline rate 135, moderate varibility, 15x15 acel, no decel Toco: Moderate contractions q1-38min, 30-60s duration  Labs: Lab Results  Component Value Date   WBC 7.6 08/21/2018   HGB 9.3 (L) 08/21/2018   HCT 30.0 (L) 08/21/2018   MCV 74.8 (L) 08/21/2018   PLT 198 08/21/2018    Patient Active Problem List   Diagnosis Date Noted  . Non-reassuring electronic fetal monitoring tracing 08/21/2018  . Post term pregnancy at [redacted] weeks gestation 08/21/2018  . Chlamydia infection affecting pregnancy in third trimester 07/30/2018  . Obesity (BMI 30.0-34.9) 04/03/2018  . Supervision of normal first pregnancy, antepartum 02/05/2018  . History of umbilical hernia repair 02/05/2018    Assessment / Plan: 18 y.o. G1P0000 at [redacted]w[redacted]d here for IOL due to NRFHT  Labor: IOL, cytotec x1 at 1148 Fetal Wellbeing: Category 1 Pain Control:  IV medication (fentanyl), planning for epidural. Anticipated MOD:  SVD  Trenda Moots MS3  OB Fellow  08/21/2018, 3:01 PM

## 2018-08-21 NOTE — Telephone Encounter (Signed)
Preadmission screen  

## 2018-08-21 NOTE — MAU Note (Signed)
Pt presents to MAU with complaints of lower abdominal cramping throughout the night. Had her membranes stripped in the office yesterday. Denies any VB or LOF

## 2018-08-21 NOTE — Progress Notes (Signed)
Dr. Earlene Plater informed of labor eval, SVE results, variable decel noted @ 0853.  MD requests pt be monitored for the next hour, decision will be made based on tracing.

## 2018-08-21 NOTE — Progress Notes (Signed)
Bradleigh L Mierzwa is a 18 y.o. G1P0000 at [redacted]w[redacted]d as an IOl for NRFHT  Subjective: Doing well, pain well controlled.   Objective: BP (!) 93/50   Pulse 69   Temp 98 F (36.7 C) (Oral)   Resp 18   Ht 4\' 11"  (1.499 m)   Wt 83.5 kg   LMP  (LMP Unknown)   SpO2 96%   BMI 37.16 kg/m  I/O last 3 completed shifts: In: -  Out: 200 [Urine:200] No intake/output data recorded.  FHT:  FHR: 120 bpm, variability: moderate,  accelerations:  Present,  decelerations:  Present variable x1 UC:   irregular, no adequate contractions SVE:   Dilation: 5 Effacement (%): 90 Station: 0 Exam by:: B Holliday RN  Labs: Lab Results  Component Value Date   WBC 7.6 08/21/2018   HGB 9.3 (L) 08/21/2018   HCT 30.0 (L) 08/21/2018   MCV 74.8 (L) 08/21/2018   PLT 198 08/21/2018    Assessment / Plan: 18 year old G1p0000 at [redacted]w[redacted]d here for IOL due to NRFHT  Labor: progressing on pitocin. S/P cyto x1 Preeclampsia:  N/A Fetal Wellbeing:  Category II Pain Control:  Epidural I/D:  GBS +, on PCN Anticipated MOD:  NSVD  Myrene Buddy 08/21/2018, 9:43 PM

## 2018-08-21 NOTE — Anesthesia Preprocedure Evaluation (Signed)
Anesthesia Evaluation  Patient identified by MRN, date of birth, ID band Patient awake    Reviewed: Allergy & Precautions, NPO status , Patient's Chart, lab work & pertinent test results  Airway Mallampati: II  TM Distance: >3 FB Neck ROM: Full    Dental  (+) Teeth Intact, Dental Advisory Given   Pulmonary neg pulmonary ROS,    Pulmonary exam normal breath sounds clear to auscultation       Cardiovascular negative cardio ROS Normal cardiovascular exam Rhythm:Regular Rate:Normal     Neuro/Psych PSYCHIATRIC DISORDERS Schizophrenia negative neurological ROS     GI/Hepatic Neg liver ROS, GERD  Medicated,  Endo/Other  Obesity   Renal/GU negative Renal ROS     Musculoskeletal negative musculoskeletal ROS (+)   Abdominal   Peds  Hematology  (+) Blood dyscrasia, anemia , Plt 198k   Anesthesia Other Findings Day of surgery medications reviewed with the patient.  Reproductive/Obstetrics (+) Pregnancy                             Anesthesia Physical Anesthesia Plan  ASA: II  Anesthesia Plan: Epidural   Post-op Pain Management:    Induction:   PONV Risk Score and Plan: 2 and Treatment may vary due to age or medical condition  Airway Management Planned: Natural Airway  Additional Equipment:   Intra-op Plan:   Post-operative Plan:   Informed Consent: I have reviewed the patients History and Physical, chart, labs and discussed the procedure including the risks, benefits and alternatives for the proposed anesthesia with the patient or authorized representative who has indicated his/her understanding and acceptance.   Dental advisory given  Plan Discussed with:   Anesthesia Plan Comments: (Patient identified. Risks/Benefits/Options discussed with patient including but not limited to bleeding, infection, nerve damage, paralysis, failed block, incomplete pain control, headache, blood  pressure changes, nausea, vomiting, reactions to medication both or allergic, itching and postpartum back pain. Confirmed with bedside nurse the patient's most recent platelet count. Confirmed with patient that they are not currently taking any anticoagulation, have any bleeding history or any family history of bleeding disorders. Patient expressed understanding and wished to proceed. All questions were answered. )        Anesthesia Quick Evaluation

## 2018-08-21 NOTE — H&P (Addendum)
LABOR AND DELIVERY ADMISSION HISTORY AND PHYSICAL NOTE  Dana Flores is a 18 y.o. female G1P0000 with IUP at 5630w0d presenting for non-reassuring fetal HR.  She reports positive fetal movement. She denies leakage of fluid or vaginal bleeding.  [facility] Pregnancy complications:  - Chlamydia in third trimester (12/12), treated 12/16 and possibly yesterday evening (1/6) due to partner being untreated though patient's report of medication compliance has been inconsistent. - Hx of umbilical hernia repair - Obesity, BMI 37  Past Medical History: Past Medical History:  Diagnosis Date  . 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 Umbilical hernia     Past Surgical History: Past Surgical History:  Procedure Laterality Date  . TOOTH EXTRACTION  2014  . UMBILICAL HERNIA REPAIR      Obstetrical History: OB History    Gravida  1   Para  0   Term  0   Preterm  0   AB  0   Living  0     SAB  0   TAB  0   Ectopic  0   Multiple  0   Live Births  0           Social History: Social History   Socioeconomic History  . Marital status: Single    Spouse name: Not on file  . Number of children: Not on file  . Years of education: Not on file  . Highest education level: Not on file  Occupational History  . Not on file  Social Needs  . Financial resource strain: Not hard at all  . Food insecurity:    Worry: Never true    Inability: Never true  . Transportation needs:    Medical: No    Non-medical: Not on file  Tobacco Use  . Smoking status: Never Smoker  . Smokeless tobacco: Never Used  Substance and Sexual Activity  . Alcohol use: No    Frequency: Never  . Drug use: Not Currently    Comment: pt. states she hasn't used since 2017  . Sexual activity: Yes    Birth control/protection: None  Lifestyle  . Physical activity:    Days per week: Not on file   Minutes per session: Not on file  . Stress: Only a little  Relationships  . Social connections:    Talks on phone: Not on file    Gets together: Not on file    Attends religious service: Not on file    Active member of club or organization: Not on file    Attends meetings of clubs or organizations: Not on file    Relationship status: Not on file  Other Topics Concern  . Not on file  Social History Narrative  . Not on file    Family History: Family History  Problem Relation Age of Onset  . ADD / ADHD Father   . Alcohol abuse Father   . Asthma Father   . Drug abuse Father   . Hypertension Father   . Stroke Father   . ADD / ADHD Brother   . Anxiety disorder Brother   . Asthma Brother   . Heart disease Maternal Grandmother   . Obesity Maternal Grandmother   . Early death Maternal Grandmother   . Diabetes Paternal Grandmother   . Hyperlipidemia Paternal Grandmother   . Obesity Paternal Grandmother     Allergies: No Known Allergies  Medications Prior to  Admission  Medication Sig Dispense Refill Last Dose  . Prenatal Vit-Fe Phos-FA-Omega (VITAFOL GUMMIES) 3.33-0.333-34.8 MG CHEW Chew 3 tablets by mouth daily before breakfast. 90 tablet 11 Past Week at Unknown time  . Elastic Bandages & Supports (COMFORT FIT MATERNITY SUPP SM) MISC Wear as directed. 1 each 0 Taking  . ferrous sulfate 325 (65 FE) MG tablet Take 1 tablet (325 mg total) by mouth 2 (two) times daily with a meal. (Patient not taking: Reported on 07/19/2018) 60 tablet 5 Not Taking  . omeprazole (PRILOSEC) 20 MG capsule Take 1 capsule (20 mg total) by mouth 2 (two) times daily before a meal. (Patient not taking: Reported on 07/26/2018) 60 capsule 5 Not Taking     Review of Systems  All systems reviewed and negative except as stated in HPI  Physical Exam Blood pressure 113/61, pulse 97, temperature 98 F (36.7 C), temperature source Oral, resp. rate 18, height 4\' 11"  (1.499 m), weight 83.5 kg, SpO2 99 %. General  appearance: alert, oriented, NAD Lungs: normal respiratory effort Heart: regular rate Abdomen: soft, non-tender; gravid, FH appropriate for GA Extremities: No calf swelling or tenderness Presentation: Vertex Fetal monitoring: Baseline HR 135, 15x15, variable decels Uterine activity: Irregular contractions, 50-60s duration Dilation: 1 Effacement (%): 70 Station: -2, -1 Exam by:: Lorn Junes. Goodman, RN  Prenatal labs: ABO, Rh: --/--/O POS (01/07 16100942) Antibody: NEG (01/07 [id number]) Rubella: 1.42 (06/24 1505) RPR: Non Reactive (10/23 1040)  HBsAg: Negative (06/24 1505)  HIV: Non Reactive (10/23 1040)  GC/Chlamydia: Positive 12/12  GBS: Positive (12/12 1000)  2-hr GTT: 71 Genetic screening: NIPS low risk Anatomy US: US 12/12 showed live late preterm fetus with normal amniotic fluid, completion of cardiac anatomy survey not possible  Prenatal Transfer Tool  Maternal Diabetes: No Genetic Screening: Normal Maternal Ultrasounds/Referrals: Normal Fetal Ultrasounds or other Referrals:  None Maternal Substance Abuse:  No Significant Maternal Medications:  None Significant Maternal Lab Results: Lab values include: Group B Strep positive, Other: + Chlamydia  Results for orders placed or performed during the hospital encounter of 08/21/18 (from the past 24 hour(s))  Urinalysis, Routine w reflex microscopic   Collection Time: 08/21/18  9:00 AM  Result Value Ref Range   Color, Urine YELLOW YELLOW   APPearance CLEAR CLEAR   Specific Gravity, Urine 1.017 1.005 - 1.030   pH 6.0 5.0 - 8.0   Glucose, UA NEGATIVE NEGATIVE mg/dL   Hgb urine dipstick NEGATIVE NEGATIVE   Bilirubin Urine NEGATIVE NEGATIVE   Ketones, ur 80 (A) NEGATIVE mg/dL   Protein, ur NEGATIVE NEGATIVE mg/dL   Nitrite NEGATIVE NEGATIVE   Leukocytes, UA TRACE (A) NEGATIVE   RBC / HPF 0-5 0 - 5 RBC/hpf   WBC, UA 0-5 0 - 5 WBC/hpf   Bacteria, UA RARE (A) NONE SEEN   Squamous Epithelial / LPF 0-5 0 - 5   Mucus PRESENT   CBC    Collection Time: 08/21/18  9:42 AM  Result Value Ref Range   WBC 7.6 4.0 - 10.5 K/uL   RBC 4.01 3.87 - 5.11 MIL/uL   Hemoglobin 9.3 (L) 12.0 - 15.0 g/dL   HCT 54.030.0 (L) 98.136.0 - 19.146.0 %   MCV 74.8 (L) 80.0 - 100.0 fL   MCH 23.2 (L) 26.0 - 34.0 pg   MCHC 31.0 30.0 - 36.0 g/dL   RDW 47.815.6 (H) 29.511.5 - 62.115.5 %   Platelets 198 150 - 400 K/uL   nRBC 0.0 0.0 - 0.2 %  Type and screen [facility]   Collection Time: 08/21/18  9:42 AM  Result Value Ref Range   ABO/RH(D) O POS    Antibody Screen NEG    Sample Expiration      08/24/2018 Performed at [facility], [address] [place], [place] [address]     Patient Active Problem List   Diagnosis Date Noted  . Non-reassuring electronic fetal monitoring tracing 08/21/2018  . Post term pregnancy at [redacted] weeks gestation 08/21/2018  . Chlamydia infection affecting pregnancy in third trimester 07/30/2018  . Obesity (BMI 30.0-34.9) 04/03/2018  . Supervision of normal first pregnancy, antepartum 02/05/2018  . History of umbilical hernia repair 02/05/2018    Assessment: Solomia L Week is a 18 y.o. G1P0000 at [redacted]w[redacted]d here for non-reassuring fetal HR.  #Labor: Moderate contractions q2-4 min, lasting 30-90s. Cytotec x 1 at 1148. #Pain: IV and plan for Epidural #FWB: Variable decels, baseline HR 135, 15x15. #ID: GBS positive, started penicillin at 1032. Chlamydia positive 12/12, treated 12/16 and possibly yesterday evening (1/6) with azithromycin due to partner being untreated though report of medication compliance has been inconsistent. Treating with additional dose of azithromycin.  #MOF: Breast #MOC: Unsure #Circ: Yes, outpatient  Trenda Moots 08/21/2018, 12:28 PM  OB FELLOW HISTORY AND PHYSICAL ATTESTATION  I have seen and examined this patient; I agree with above documentation in the medical student's note and have edited as appropriate.   Marcy Siren, D.O. OB Fellow  08/21/2018, 6:55 PM

## 2018-08-21 NOTE — Progress Notes (Signed)
Patient ID: Dana Flores, female   DOB: 06-22-2001, 18 y.o.   MRN: [id number] Patient is now pushing Good effort with pushes  Vitals:   08/21/18 2002 08/21/18 2032 08/21/18 2102 08/21/18 2130  BP: (!) 97/40 (!) 87/57 (!) 92/55 (!) 93/50  Pulse: 82 74 76 69  Resp:      Temp:      TempSrc:      SpO2:      Weight:      Height:       FHR stable with variable decels with pushes that recover afterward UCs every 2 min  Dilation: 10 Dilation Complete Date: 08/21/18 Dilation Complete Time: 2208 Effacement (%): 100 Cervical Position: Posterior Station: Plus 2 Presentation: Vertex Exam by:: B Holliday RN  Anticipate SVD

## 2018-08-21 NOTE — Anesthesia Procedure Notes (Signed)
Epidural Patient location during procedure: OB Start time: 08/21/2018 4:00 PM End time: 08/21/2018 4:06 PM  Staffing Anesthesiologist: Cecile Hearing, MD Performed: anesthesiologist   Preanesthetic Checklist Completed: patient identified, pre-op evaluation, timeout performed, IV checked, risks and benefits discussed and monitors and equipment checked  Epidural Patient position: sitting Prep: DuraPrep Patient monitoring: blood pressure and continuous pulse ox Approach: midline Location: L3-L4 Injection technique: LOR air  Needle:  Needle type: Tuohy  Needle gauge: 17 G Needle length: 9 cm Needle insertion depth: 6 cm Catheter size: 19 Gauge Catheter at skin depth: 11 cm Test dose: negative and Other (1% Lidocaine)  Additional Notes Patient identified.  Risk benefits discussed including failed block, incomplete pain control, headache, nerve damage, paralysis, blood pressure changes, nausea, vomiting, reactions to medication both toxic or allergic, and postpartum back pain.  Patient expressed understanding and wished to proceed.  All questions were answered.  Sterile technique used throughout procedure and epidural site dressed with sterile barrier dressing. No paresthesia or other complications noted. The patient did not experience any signs of intravascular injection such as tinnitus or metallic taste in mouth nor signs of intrathecal spread such as rapid motor block. Please see nursing notes for vital signs. Reason for block:procedure for pain

## 2018-08-22 ENCOUNTER — Encounter (HOSPITAL_COMMUNITY): Payer: Self-pay

## 2018-08-22 LAB — URINE CYTOLOGY ANCILLARY ONLY
Chlamydia: NEGATIVE
Neisseria Gonorrhea: NEGATIVE

## 2018-08-22 LAB — RPR: RPR Ser Ql: NONREACTIVE

## 2018-08-22 MED ORDER — PRENATAL MULTIVITAMIN CH
1.0000 | ORAL_TABLET | Freq: Every day | ORAL | Status: DC
  Administered 2018-08-22 – 2018-08-23 (×2): 1 via ORAL
  Filled 2018-08-22 (×2): qty 1

## 2018-08-22 MED ORDER — IBUPROFEN 600 MG PO TABS
600.0000 mg | ORAL_TABLET | Freq: Four times a day (QID) | ORAL | Status: DC
  Administered 2018-08-22 – 2018-08-23 (×6): 600 mg via ORAL
  Filled 2018-08-22 (×6): qty 1

## 2018-08-22 MED ORDER — BENZOCAINE-MENTHOL 20-0.5 % EX AERO
1.0000 "application " | INHALATION_SPRAY | CUTANEOUS | Status: DC | PRN
  Administered 2018-08-22: 1 via TOPICAL
  Filled 2018-08-22: qty 56

## 2018-08-22 MED ORDER — ONDANSETRON HCL 4 MG PO TABS: 4.0000 mg | ORAL_TABLET | ORAL | Status: DC | PRN

## 2018-08-22 MED ORDER — COCONUT OIL OIL
1.0000 "application " | TOPICAL_OIL | Status: DC | PRN
  Administered 2018-08-22: 1 via TOPICAL
  Filled 2018-08-22: qty 120

## 2018-08-22 MED ORDER — DIBUCAINE 1 % RE OINT: 1.0000 "application " | TOPICAL_OINTMENT | RECTAL | Status: DC | PRN

## 2018-08-22 MED ORDER — DIPHENHYDRAMINE HCL 25 MG PO CAPS: 25.0000 mg | ORAL_CAPSULE | Freq: Four times a day (QID) | ORAL | Status: DC | PRN

## 2018-08-22 MED ORDER — SIMETHICONE 80 MG PO CHEW: 80.0000 mg | CHEWABLE_TABLET | ORAL | Status: DC | PRN

## 2018-08-22 MED ORDER — ACETAMINOPHEN 325 MG PO TABS: 650.0000 mg | ORAL_TABLET | ORAL | Status: DC | PRN

## 2018-08-22 MED ORDER — WITCH HAZEL-GLYCERIN EX PADS: 1.0000 "application " | MEDICATED_PAD | CUTANEOUS | Status: DC | PRN

## 2018-08-22 MED ORDER — ONDANSETRON HCL 4 MG/2ML IJ SOLN: 4.0000 mg | INTRAMUSCULAR | Status: DC | PRN

## 2018-08-22 MED ORDER — SENNOSIDES-DOCUSATE SODIUM 8.6-50 MG PO TABS
2.0000 | ORAL_TABLET | ORAL | Status: DC
  Administered 2018-08-22: 2 via ORAL
  Filled 2018-08-22: qty 2

## 2018-08-22 MED ORDER — ZOLPIDEM TARTRATE 5 MG PO TABS: 5.0000 mg | ORAL_TABLET | Freq: Every evening | ORAL | Status: DC | PRN

## 2018-08-22 MED ORDER — TETANUS-DIPHTH-ACELL PERTUSSIS 5-2.5-18.5 LF-MCG/0.5 IM SUSP: 0.5000 mL | Freq: Once | INTRAMUSCULAR | Status: DC

## 2018-08-22 NOTE — Progress Notes (Signed)
CSW acknowledges consult.  CSW attempted to meet with MOB, however MOB had several room guest.  CSW will attempt to visit with MOB at a later time.   Sherley Leser Boyd-Gilyard, MSW, LCSW Clinical Social Work [phone]  

## 2018-08-22 NOTE — Anesthesia Postprocedure Evaluation (Signed)
Anesthesia Post Note  Patient: Dana Flores  Procedure(s) Performed: AN AD HOC LABOR EPIDURAL     Patient location during evaluation: Mother Baby Anesthesia Type: Epidural Level of consciousness: awake Pain management: pain level controlled Vital Signs Assessment: post-procedure vital signs reviewed and stable Respiratory status: spontaneous breathing Cardiovascular status: stable Postop Assessment: epidural receding, patient able to bend at knees, no backache and no headache Anesthetic complications: no    Last Vitals:  Vitals:   08/22/18 0240 08/22/18 0640  BP: (!) 93/50 100/66  Pulse: 73 97  Resp: 16 18  Temp: 36.9 C 37.1 C  SpO2:      Last Pain:  Vitals:   08/22/18 0640  TempSrc: Oral  PainSc: 4    Pain Goal: Patients Stated Pain Goal: 8 (08/21/18 1014)               Edison Pace

## 2018-08-22 NOTE — Progress Notes (Signed)
POSTPARTUM PROGRESS NOTE  Post Partum Day 1  Subjective:  Dana Flores is a 18 y.o. G1P1001 s/p SVD at [redacted]w[redacted]d.  She reports she is doing well. No acute events overnight. She denies any problems with ambulating, voiding or po intake. Denies nausea or vomiting.  Pain is well controlled.  Lochia is decreased, used 1 pad overnight. No clots. No dizziness with ambulation.   Objective: Blood pressure 100/66, pulse 97, temperature 98.8 F (37.1 C), temperature source Oral, resp. rate 18, height 4\' 11"  (1.499 m), weight 83.5 kg, SpO2 96 %, unknown if currently breastfeeding.  Physical Exam:  General: alert, cooperative and no distress Chest: no respiratory distress Heart:regular rate, distal pulses intact Abdomen: soft, nontender Uterine Fundus: firm, appropriately tender DVT Evaluation: No calf swelling or tenderness Extremities: No edema Skin: warm, dry  Recent Labs    08/21/18 0942  HGB 9.3*  HCT 30.0*    Assessment/Plan: Dana Flores is a 18 y.o. G1P1001 s/p SVD at [redacted]w[redacted]d   PPD#1 - Doing well Routine postpartum care Contraception: OCP's Feeding: Breast Dispo: Plan for discharge tomorrow   LOS: 1 day   Trenda Moots MS3 08/22/2018, 7:42 AM

## 2018-08-23 MED ORDER — SENNOSIDES-DOCUSATE SODIUM 8.6-50 MG PO TABS: 2.0000 | ORAL_TABLET | ORAL | 0 refills | Status: DC

## 2018-08-23 MED ORDER — IBUPROFEN 600 MG PO TABS: 600.0000 mg | ORAL_TABLET | Freq: Four times a day (QID) | ORAL | 0 refills | Status: DC
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
## 2018-08-23 NOTE — Progress Notes (Addendum)
Notified social worker Selena Batten, patient is dc just waiting for them to see patient before dc.

## 2018-08-23 NOTE — Lactation Note (Signed)
This note was copied from a baby's chart. Lactation Consultation Note  Patient Name: Dana Flores [id number] Date: 08/23/2018 Reason for consult: Follow-up assessment Mom has started formula feeding due to sore nipples.  Mom states baby is biting.  Nipples intact.  Instructed on importance of pumping every 3 hours to establish and maintain her milk supply. Symphony pump was set up this morning and mom is getting ready to pump for the first time.  Offered assist but baby was just formula fed.  Unsure if mom is interested in assist.  Instructed to call if she desires assist before discharge.  Lactation outpatient services and support reviewed and encouraged prn.  Maternal Data    Feeding Feeding Type: Bottle Fed - Formula  LATCH Score                   Interventions    Lactation Tools Discussed/Used Pump Review: Setup, frequency, and cleaning Initiated by:: JS Date initiated:: 08/23/18   Consult Status Consult Status: Complete Follow-up type: Call as needed    Huston Foley 08/23/2018, 9:36 AM

## 2018-08-23 NOTE — Discharge Summary (Addendum)
OB Discharge Summary     Patient Name: Dana Flores DOB: 02-03-2001 MRN: [id number]  Date of admission: 08/21/2018 Delivering MD: Myrene Buddy   Date of discharge: 08/23/2018  Admitting diagnosis: 40WKS CRAMPING AND PRESSURE Intrauterine pregnancy: [redacted]w[redacted]d     Secondary diagnosis:  Active Problems:   Non-reassuring electronic fetal monitoring tracing   Post term pregnancy at [redacted] weeks gestation  Additional problems: GBS +, + Chlamydia 07/26/2018, Hx of Schizophrenia     Discharge diagnosis: Post-term Pregnancy, Chlamydia infection affecting Pregnancy                                                                                                Post partum procedures:None  Augmentation: Pitocin and Cytotec  Complications: None  Hospital course:  Onset of Labor With Vaginal Delivery     18 y.o. yo G1P1001 at [redacted]w[redacted]d was admitted in Latent Labor on 08/21/2018. Patient had an uncomplicated labor course as follows:  Membrane Rupture Time/Date: 3:30 PM ,08/21/2018   Intrapartum Procedures: Episiotomy: None [1]                                         Lacerations:  1st degree [2]  Patient had a delivery of a Viable infant. 08/21/2018  Information for the patient's newborn:  Dana, Krahl Flores [[id number]]  Delivery Method: Vaginal, Spontaneous(Filed from Delivery Summary)   Pateint had an uncomplicated postpartum course.  She is ambulating, tolerating a regular diet, passing flatus, and urinating well. Patient is discharged home in stable condition on 08/23/18.  Physical exam  Vitals:   08/22/18 0640 08/22/18 0930 08/22/18 1345 08/23/18 0522  BP: 100/66 112/73 (!) 100/52 132/75  Pulse: 97 82 78 76  Resp: 18 18 18 16   Temp: 98.8 F (37.1 C) 98.8 F (37.1 C) 98.2 F (36.8 C)   TempSrc: Oral Oral Oral   SpO2:   100%   Weight:      Height:       General: alert and no distress Lochia: appropriate Uterine Fundus: firm Incision: Healing well with no significant drainage DVT  Evaluation: No evidence of DVT seen on physical exam. Negative Homan's sign. No cords or calf tenderness. No significant calf/ankle edema. Labs: Lab Results  Component Value Date   WBC 7.6 08/21/2018   HGB 9.3 (L) 08/21/2018   HCT 30.0 (L) 08/21/2018   MCV 74.8 (L) 08/21/2018   PLT 198 08/21/2018   CMP Latest Ref Rng & Units 10/30/2015  Glucose 65 - 99 mg/dL 82  BUN 6 - 20 mg/dL 11  Creatinine 6.14 - 7.09 mg/dL 2.95  Sodium 747 - 340 mmol/L 142  Potassium 3.5 - 5.1 mmol/L 4.3  Chloride 101 - 111 mmol/L 109  CO2 22 - 32 mmol/L 22  Calcium 8.9 - 10.3 mg/dL 9.2  Total Protein 6.5 - 8.1 g/dL 7.3  Total Bilirubin 0.3 - 1.2 mg/dL 0.8  Alkaline Phos 47 - 119 U/L 54  AST 15 - 41 U/L 20  ALT 14 -  54 U/L 14    Discharge instruction: per After Visit Summary and "Baby and Me Booklet".  After visit meds:  Allergies as of 08/23/2018   No Known Allergies     Medication List    TAKE these medications   COMFORT FIT MATERNITY SUPP SM Misc Wear as directed.   ferrous sulfate 325 (65 FE) MG tablet Take 1 tablet (325 mg total) by mouth 2 (two) times daily with a meal.   ibuprofen 600 MG tablet Commonly known as:  ADVIL,MOTRIN Take 1 tablet (600 mg total) by mouth every 6 (six) hours.   omeprazole 20 MG capsule Commonly known as:  PRILOSEC Take 1 capsule (20 mg total) by mouth 2 (two) times daily before a meal.   senna-docusate 8.6-50 MG tablet Commonly known as:  Senokot-S Take 2 tablets by mouth daily.   VITAFOL GUMMIES 3.33-0.333-34.8 MG Chew Chew 3 tablets by mouth daily before breakfast.       Diet: routine diet  Activity: Advance as tolerated. Pelvic rest for 6 weeks.   Outpatient follow up:6 weeks Follow up Appt: Future Appointments  Date Time Provider Department Center  09/05/2018  8:30 AM [facility] NURSE [facility]  09/19/2018  9:15 AM Burleson, Brand Males, NP [facility]   Follow up Visit:No follow-ups on file.  Postpartum contraception: Progesterone only  pills  Newborn Data: Live born female  Birth Weight: 7 lb 2.8 oz (3255 g) APGAR: 9, 9  Newborn Delivery   Birth date/time:  08/21/2018 23:24:00 Delivery type:  Vaginal, Spontaneous     Baby Feeding: Bottle and Breast Disposition:home with mother   08/23/2018 Dollene Cleveland, DO   OB FELLOW DISCHARGE ATTESTATION  I have seen and examined this patient and agree with above documentation in the resident's note.   Marcy Siren, D.O. OB Fellow  08/23/2018, 8:45 AM

## 2018-08-24 ENCOUNTER — Other Ambulatory Visit: Payer: Medicaid Other

## 2018-08-27 ENCOUNTER — Encounter: Payer: Medicaid Other | Admitting: Obstetrics and Gynecology

## 2018-08-28 ENCOUNTER — Inpatient Hospital Stay (HOSPITAL_COMMUNITY)
Admission: RE | Admit: 2018-08-28 | Discharge: 2018-08-28 | Disposition: A | Discharge status: Home / Self Care | Payer: Medicaid Other | Source: Ambulatory Visit | Attending: Family Medicine | Admitting: Family Medicine

## 2018-09-05 ENCOUNTER — Ambulatory Visit: Payer: Medicaid Other

## 2018-09-18 ENCOUNTER — Ambulatory Visit: Payer: Medicaid Other | Admitting: Advanced Practice Midwife

## 2018-09-19 ENCOUNTER — Ambulatory Visit: Payer: Medicaid Other | Admitting: Nurse Practitioner

## 2018-09-25 ENCOUNTER — Telehealth: Payer: Self-pay | Admitting: Licensed Clinical Social Worker

## 2018-09-25 NOTE — Telephone Encounter (Signed)
Spoke to pt to remind of appt 2/12.

## 2018-09-26 ENCOUNTER — Encounter: Payer: Self-pay | Admitting: Nurse Practitioner

## 2018-09-26 ENCOUNTER — Ambulatory Visit (INDEPENDENT_AMBULATORY_CARE_PROVIDER_SITE_OTHER): Payer: Medicaid Other | Admitting: Nurse Practitioner

## 2018-09-26 ENCOUNTER — Other Ambulatory Visit (HOSPITAL_COMMUNITY)
Admission: RE | Admit: 2018-09-26 | Discharge: 2018-09-26 | Disposition: A | Discharge status: Home / Self Care | Payer: Medicaid Other | Source: Ambulatory Visit | Attending: Nurse Practitioner | Admitting: Nurse Practitioner

## 2018-09-26 DIAGNOSIS — O98813 Other maternal infectious and parasitic diseases complicating pregnancy, third trimester: Secondary | ICD-10-CM

## 2018-09-26 DIAGNOSIS — Z3041 Encounter for surveillance of contraceptive pills: Secondary | ICD-10-CM

## 2018-09-26 DIAGNOSIS — A749 Chlamydial infection, unspecified: Secondary | ICD-10-CM

## 2018-09-26 DIAGNOSIS — Z1389 Encounter for screening for other disorder: Secondary | ICD-10-CM | POA: Diagnosis not present

## 2018-09-26 DIAGNOSIS — F17200 Nicotine dependence, unspecified, uncomplicated: Secondary | ICD-10-CM

## 2018-09-26 NOTE — Progress Notes (Signed)
Post Partum Exam  Dana Flores is a 18 y.o. G78P1001 female who presents for a postpartum visit. She is 5 weeks postpartum following a spontaneous vaginal delivery. I have fully reviewed the prenatal and intrapartum course. The delivery was at 40 gestational weeks.  Anesthesia: epidural. Postpartum course has been unremarkable. Baby's course has been unremarkable. Baby is feeding by bottle - Similac Advance. Bleeding no bleeding. Bowel function is normal. Bladder function is normal. Patient is sexually active. Contraception method is oral progesterone-only contraceptive. She has not yet started the pills but has been using condoms for contraception.  Postpartum depression screening:neg Complains of periodic dizziness - does not want blood drawn today. The following portions of the patient's history were reviewed and updated as appropriate: allergies, current medications, past medical history, past social history, past surgical history and problem list. Pap not indicated at this time.  Review of Systems Pertinent items noted in HPI and remainder of comprehensive ROS otherwise negative.    Objective:  Blood pressure 105/64, pulse 63, resp. rate 16, height 4\' 11"  (1.499 m), weight 165 lb 11.2 oz (75.2 kg), last menstrual period 09/25/2018, not currently breastfeeding.  General:  alert, cooperative and no distress   Breasts:  not examined  Lungs: clear to auscultation bilaterally  Heart:  regular rate and rhythm, S1, S2 normal, no murmur, click, rub or gallop  Abdomen: soft, non-tender; bowel sounds normal; no masses,  no organomegaly   Vulva:  not evaluated  Vagina: not evaluated  Cervix:  not evaluated  Corpus: not examined  Adnexa:  not evaluated  Rectal Exam: Not performed.        Assessment:    Normal postpartum exam. Pap smear not done at today's visit. Not indicated.  Plan:   1. Contraception: oral progesterone-only contraceptive  Will start tomorrow.  Did not start as she was not  sure she could take it and breastfeed.  Only breastfeeds at night when she does not want to get up to make a bottle. 2.  With diet recall admits to eating every 6 hours and not eating lots of protein.  Discussed healthy diet.  Admits to smoking to be able to have a BM regularly.  Reviewed dietary fiber. 3. Follow up in: 3 months or as needed.  Will need to see how she is doing on pills and if no longer is breastfeeding, will need to change to combination pill. 4. Advised no smoking as it is better for her health and the health of her baby (less colds, ear infections, pneumonia and asthma).  Nolene Bernheim, RN, MSN, NP-BC Nurse Practitioner, [facility] for [employer], [facility] 09/26/2018 2:56 PM

## 2018-09-26 NOTE — Patient Instructions (Addendum)
Call 1-800-QUIT-NOW to stop smoking. Drink at least 8 8-oz glasses of water every day. Begin exercise at least 3 times a week. Look at website - [place].org for more information on contraceptive choices   Constipation, Adult Constipation is when a person:  Poops (has a bowel movement) fewer times in a week than normal.  Has a hard time pooping.  Has poop that is dry, hard, or bigger than normal. Follow these instructions at home: Eating and drinking   Eat foods that have a lot of fiber, such as: ? Fresh fruits and vegetables. ? Whole grains. ? Beans.  Eat less of foods that are high in fat, low in fiber, or overly processed, such as: ? [place] fries. ? Hamburgers. ? Cookies. ? Candy. ? Soda.  Drink enough fluid to keep your pee (urine) clear or pale yellow. General instructions  Exercise regularly or as told by your doctor.  Go to the restroom when you feel like you need to poop. Do not hold it in.  Take over-the-counter and prescription medicines only as told by your doctor. These include any fiber supplements.  Do pelvic floor retraining exercises, such as: ? Doing deep breathing while relaxing your lower belly (abdomen). ? Relaxing your pelvic floor while pooping.  Watch your condition for any changes.  Keep all follow-up visits as told by your doctor. This is important. Contact a doctor if:  You have pain that gets worse.  You have a fever.  You have not pooped for 4 days.  You throw up (vomit).  You are not hungry.  You lose weight.  You are bleeding from the anus.  You have thin, pencil-like poop (stool). Get help right away if:  You have a fever, and your symptoms suddenly get worse.  You leak poop or have blood in your poop.  Your belly feels hard or bigger than normal (is bloated).  You have very bad belly pain.  You feel dizzy or you faint. This information is not intended to replace advice given to you by your health care provider.  Make sure you discuss any questions you have with your health care provider. Document Released: 01/18/2008 Document Revised: 02/19/2016 Document Reviewed: 01/20/2016 Elsevier Interactive Patient Education  2019 [place].  Oral Contraception Use Oral contraceptive pills (OCPs) are medicines that you take to prevent pregnancy. OCPs work by:  Preventing the ovaries from releasing eggs.  Thickening mucus in the lower part of the uterus (cervix), which prevents sperm from entering the uterus.  Thinning the lining of the uterus (endometrium), which prevents a fertilized egg from attaching to the endometrium. OCPs are highly effective when taken exactly as prescribed. However, OCPs do not prevent sexually transmitted infections (STIs). Safe sex practices, such as using condoms while on an OCP, can help prevent STIs. Before taking OCPs, you may have a physical exam, blood test, and Pap test. A Pap test involves taking a sample of cells from your cervix to check for cancer. Discuss with your health care provider the possible side effects of the OCP you may be prescribed. When you start an OCP, be aware that it can take 2-3 months for your body to adjust to changes in hormone levels. How to take oral contraceptive pills Follow instructions from your health care provider about how to start taking your first cycle of OCPs. Your health care provider may recommend that you:  Start the pill on day 1 of your menstrual period. If you start at this time, you  will not need any backup form of birth control (contraception), such as condoms.  Start the pill on the first Sunday after your menstrual period or on the day you get your prescription. In these cases, you will need to use backup contraception for the first week.  Start the pill at any time of your cycle. ? If you take the pill within 5 days of the start of your period, you will not need a backup form of contraception. ? If you start at any other time  of your menstrual cycle, you will need to use another form of contraception for 7 days. If your OCP is the type called a minipill, it will protect you from pregnancy after taking it for 2 days (48 hours), and you can stop using backup contraception after that time. After you have started taking OCPs:  If you forget to take 1 pill, take it as soon as you remember. Take the next pill at the regular time.  If you miss 2 or more pills, call your health care provider. Different pills have different instructions for missed doses. Use backup birth control until your next menstrual period starts.  If you use a 28-day pack that contains inactive pills and you miss 1 of the last 7 pills (pills with no hormones), throw away the rest of the non-hormone pills and start a new pill pack. No matter which day you start the OCP, you will always start a new pack on that same day of the week. Have an extra pack of OCPs and a backup contraceptive method available in case you miss some pills or lose your OCP pack. Follow these instructions at home:  Do not use any products that contain nicotine or tobacco, such as cigarettes and e-cigarettes. If you need help quitting, ask your health care provider.  Always use a condom to protect against STIs. OCPs do not protect against STIs.  Use a calendar to mark the days of your menstrual period.  Read the information and directions that came with your OCP. Talk to your health care provider if you have questions. Contact a health care provider if:  You develop nausea and vomiting.  You have abnormal vaginal discharge or bleeding.  You develop a rash.  You miss your menstrual period. Depending on the type of OCP you are taking, this may be a sign of pregnancy. Ask your health care provider for more information.  You are losing your hair.  You need treatment for mood swings or depression.  You get dizzy when taking the OCP.  You develop acne after taking the  OCP.  You become pregnant or think you may be pregnant.  You have diarrhea, constipation, and abdominal pain or cramps.  You miss 2 or more pills. Get help right away if:  You develop chest pain.  You develop shortness of breath.  You have an uncontrolled or severe headache.  You develop numbness or slurred speech.  You develop visual or speech problems.  You develop pain, redness, and swelling in your legs.  You develop weakness or numbness in your arms or legs. Summary  Oral contraceptive pills (OCPs) are medicines that you take to prevent pregnancy.  OCPs do not prevent sexually transmitted infections (STIs). Always use a condom to protect against STIs.  When you start an OCP, be aware that it can take 2-3 months for your body to adjust to changes in hormone levels.  Read all the information and directions that come with your  OCP. This information is not intended to replace advice given to you by your health care provider. Make sure you discuss any questions you have with your health care provider. Document Released: 07/21/2011 Document Revised: 09/12/2016 Document Reviewed: 09/12/2016 Elsevier Interactive Patient Education  2019 [place] Following a healthy eating pattern may help you to achieve and maintain a healthy body weight, reduce the risk of chronic disease, and live a long and productive life. It is important to follow a healthy eating pattern at an appropriate calorie level for your body. Your nutritional needs should be met primarily through food by choosing a variety of nutrient-rich foods. What are tips for following this plan? Reading food labels  Read labels and choose the following: ? Reduced or low sodium. ? Juices with 100% fruit juice. ? Foods with low saturated fats and high polyunsaturated and monounsaturated fats. ? Foods with whole grains, such as whole wheat, cracked wheat, brown rice, and wild rice. ? Whole grains that  are fortified with folic acid. This is recommended for women who are pregnant or who want to become pregnant.  Read labels and avoid the following: ? Foods with a lot of added sugars. These include foods that contain brown sugar, corn sweetener, corn syrup, dextrose, fructose, glucose, high-fructose corn syrup, honey, invert sugar, lactose, malt syrup, maltose, molasses, raw sugar, sucrose, trehalose, or turbinado sugar.  Do not eat more than the following amounts of added sugar per day:  6 teaspoons (25 g) for women.  9 teaspoons (38 g) for men. ? Foods that contain processed or refined starches and grains. ? Refined grain products, such as white flour, degermed cornmeal, white bread, and white rice. Shopping  Choose nutrient-rich snacks, such as vegetables, whole fruits, and nuts. Avoid high-calorie and high-sugar snacks, such as potato chips, fruit snacks, and candy.  Use oil-based dressings and spreads on foods instead of solid fats such as butter, stick margarine, or cream cheese.  Limit pre-made sauces, mixes, and "instant" products such as flavored rice, instant noodles, and ready-made pasta.  Try more plant-protein sources, such as tofu, tempeh, black beans, edamame, lentils, nuts, and seeds.  Explore eating plans such as the Mediterranean diet or vegetarian diet. Cooking  Use oil to saut or stir-fry foods instead of solid fats such as butter, stick margarine, or lard.  Try baking, boiling, grilling, or broiling instead of frying.  Remove the fatty part of meats before cooking.  Steam vegetables in water or broth. Meal planning   At meals, imagine dividing your plate into fourths: ? One-half of your plate is fruits and vegetables. ? One-fourth of your plate is whole grains. ? One-fourth of your plate is protein, especially lean meats, poultry, eggs, tofu, beans, or nuts.  Include low-fat dairy as part of your daily diet. Lifestyle  Choose healthy options in all  settings, including home, work, school, restaurants, or stores.  Prepare your food safely: ? Wash your hands after handling raw meats. ? Keep food preparation surfaces clean by regularly washing with hot, soapy water. ? Keep raw meats separate from ready-to-eat foods, such as fruits and vegetables. ? Cook seafood, meat, poultry, and eggs to the recommended internal temperature. ? Store foods at safe temperatures. In general:  Keep cold foods at 22F (4.4C) or below.  Keep hot foods at 122F (60C) or above.  Keep your freezer at [facility] (-17.8C) or below.  Foods are no longer safe to eat when they have been between the temperatures  of 40-140F (4.4-60C) for more than 2 hours. What foods should I eat? Fruits Aim to eat 2 cup-equivalents of fresh, canned (in natural juice), or frozen fruits each day. Examples of 1 cup-equivalent of fruit include 1 small apple, 8 large strawberries, 1 cup canned fruit,  cup dried fruit, or 1 cup 100% juice. Vegetables Aim to eat 2-3 cup-equivalents of fresh and frozen vegetables each day, including different varieties and colors. Examples of 1 cup-equivalent of vegetables include 2 medium carrots, 2 cups raw, leafy greens, 1 cup chopped vegetable (raw or cooked), or 1 medium baked potato. Grains Aim to eat 6 ounce-equivalents of whole grains each day. Examples of 1 ounce-equivalent of grains include 1 slice of bread, 1 cup ready-to-eat cereal, 3 cups popcorn, or  cup cooked rice, pasta, or cereal. Meats and other proteins Aim to eat 5-6 ounce-equivalents of protein each day. Examples of 1 ounce-equivalent of protein include 1 egg, 1/2 cup nuts or seeds, or 1 tablespoon (16 g) peanut butter. A cut of meat or fish that is the size of a deck of cards is about 3-4 ounce-equivalents.  Of the protein you eat each week, try to have at least 8 ounces come from seafood. This includes salmon, trout, herring, and anchovies. Dairy Aim to eat 3 cup-equivalents of  fat-free or low-fat dairy each day. Examples of 1 cup-equivalent of dairy include 1 cup (240 mL) milk, 8 ounces (250 g) yogurt, 1 ounces (44 g) natural cheese, or 1 cup (240 mL) fortified soy milk. Fats and oils  Aim for about 5 teaspoons (21 g) per day. Choose monounsaturated fats, such as canola and olive oils, avocados, peanut butter, and most nuts, or polyunsaturated fats, such as sunflower, corn, and soybean oils, walnuts, pine nuts, sesame seeds, sunflower seeds, and flaxseed. Beverages  Aim for six 8-oz glasses of water per day. Limit coffee to three to five 8-oz cups per day.  Limit caffeinated beverages that have added calories, such as soda and energy drinks.  Limit alcohol intake to no more than 1 drink a day for nonpregnant women and 2 drinks a day for men. One drink equals 12 oz of beer (355 mL), 5 oz of wine (148 mL), or 1 oz of hard liquor (44 mL). Seasoning and other foods  Avoid adding excess amounts of salt to your foods. Try flavoring foods with herbs and spices instead of salt.  Avoid adding sugar to foods.  Try using oil-based dressings, sauces, and spreads instead of solid fats. This information is based on general [place] nutrition guidelines. For more information, visit [employer]. Exact amounts may vary based on your nutrition needs. Summary  A healthy eating plan may help you to maintain a healthy weight, reduce the risk of chronic diseases, and stay active throughout your life.  Plan your meals. Make sure you eat the right portions of a variety of nutrient-rich foods.  Try baking, boiling, grilling, or broiling instead of frying.  Choose healthy options in all settings, including home, work, school, restaurants, or stores. This information is not intended to replace advice given to you by your health care provider. Make sure you discuss any questions you have with your health care provider. Document Released: 11/13/2017 Document Revised: 11/13/2017  Document Reviewed: 11/13/2017 Elsevier Interactive Patient Education  2019 [employer].

## 2018-09-27 LAB — CERVICOVAGINAL ANCILLARY ONLY
Chlamydia: NEGATIVE
Neisseria Gonorrhea: NEGATIVE

## 2018-10-01 ENCOUNTER — Ambulatory Visit (INDEPENDENT_AMBULATORY_CARE_PROVIDER_SITE_OTHER): Payer: Medicaid Other | Admitting: Obstetrics and Gynecology

## 2018-10-01 ENCOUNTER — Ambulatory Visit (INDEPENDENT_AMBULATORY_CARE_PROVIDER_SITE_OTHER): Payer: Medicaid Other | Admitting: Clinical

## 2018-10-01 VITALS — BP 106/69 | HR 74 | Wt 163.0 lb

## 2018-10-01 DIAGNOSIS — F418 Other specified anxiety disorders: Secondary | ICD-10-CM

## 2018-10-01 DIAGNOSIS — F4323 Adjustment disorder with mixed anxiety and depressed mood: Secondary | ICD-10-CM | POA: Diagnosis not present

## 2018-10-01 DIAGNOSIS — F53 Postpartum depression: Secondary | ICD-10-CM | POA: Insufficient documentation

## 2018-10-01 DIAGNOSIS — O99345 Other mental disorders complicating the puerperium: Secondary | ICD-10-CM

## 2018-10-01 HISTORY — DX: Postpartum depression: F53.0

## 2018-10-01 HISTORY — DX: Other mental disorders complicating the puerperium: O99.345

## 2018-10-01 HISTORY — DX: Other specified anxiety disorders: F41.8

## 2018-10-01 MED ORDER — SERTRALINE HCL 50 MG PO TABS: 50.0000 mg | ORAL_TABLET | Freq: Every day | ORAL | 1 refills | Status: DC

## 2018-10-01 NOTE — Progress Notes (Signed)
S: Dana Flores is a 18 y.o. female here today in the office due to concerns about PP depression. She is status post vaginal delivery on 08/21/2018. She has a past medical  history of pyschosis and was admitted to behavioral health back in 2017 due to insomnia, and bizarre behaviors noted by the patient's mother who is an Charity fundraiser. She was started on Zyprexa at that time, however has not been taking that medication. She was admitted for more than 2 weeks.  She feels she got better until she had her baby and now these symptoms have returned. She feels angry, agitated and sleep deprived. She lives with FOB however he is not helpful with the baby. Patient voices concerns that she is awake all day and all night with baby despite FOB being home. 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 She is not concerned about the safety of the baby with FOB, however is concerned that he will make him sleep all day and then baby will be up at night with mom. At times she feels anger toward the baby because he will not sleep at night. She does not have any thoughts to harm the baby or herself, however she would like more help. Last night she stayed at her mom's house and her mom stayed up with the baby. She was able to get some rest which helped. She is not apposed to taking medication however, voices that she does not really like taking medication.  Says she has a lot of worries about the baby and she doesn't want anything to happen to him. She has investigated child care facilities however noted that babies were crying while there and she is worried that her baby will cry and not be attended too.    O:  GENERAL: Well-developed, well-nourished female in no acute distress. Tearful at times. Conversing well in conversations.  LUNGS: Effort normal SKIN: Warm, dry and without erythema PSYCH: Normal mood and affect  Vitals:   10/01/18 1314  Weight: 163 lb (73.9 kg)     A:  Post partum depression Post partum anxiety    P:  Discharge normalcy of worry with newborn. Overall good conversation with Mom today. She was given reassurance and support options including classes offered at [facility] hospital.  Patient to see Asher Muir today at [facility]; appointment made for 1420 Rx: Zoloft Continue to ask for help from mom and family member. Be direct when asking FOB to help with baby; given him specific tasks.  Take time for self daily even if it is only for 30 minutes per day Gave patient counseling options for perinatal counselors.  Call the office right away if symptoms worsen    Treyden Hakim, Harolyn Rutherford, NP 10/01/2018 3:40 PM
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
## 2018-10-15 ENCOUNTER — Ambulatory Visit: Payer: Self-pay

## 2019-05-28 ENCOUNTER — Emergency Department (HOSPITAL_COMMUNITY)
Admission: EM | Admit: 2019-05-28 | Discharge: 2019-05-28 | Disposition: A | Discharge status: Home / Self Care | Payer: Medicaid Other | Attending: Pediatric Emergency Medicine | Admitting: Pediatric Emergency Medicine

## 2019-05-28 ENCOUNTER — Encounter (HOSPITAL_COMMUNITY): Payer: Self-pay | Admitting: Emergency Medicine

## 2019-05-28 ENCOUNTER — Other Ambulatory Visit: Payer: Self-pay

## 2019-05-28 DIAGNOSIS — R509 Fever, unspecified: Secondary | ICD-10-CM | POA: Diagnosis present

## 2019-05-28 DIAGNOSIS — F17210 Nicotine dependence, cigarettes, uncomplicated: Secondary | ICD-10-CM | POA: Diagnosis not present

## 2019-05-28 DIAGNOSIS — R6889 Other general symptoms and signs: Secondary | ICD-10-CM

## 2019-05-28 DIAGNOSIS — Z79899 Other long term (current) drug therapy: Secondary | ICD-10-CM | POA: Diagnosis not present

## 2019-05-28 DIAGNOSIS — Z20828 Contact with and (suspected) exposure to other viral communicable diseases: Secondary | ICD-10-CM | POA: Diagnosis not present

## 2019-05-28 DIAGNOSIS — J111 Influenza due to unidentified influenza virus with other respiratory manifestations: Secondary | ICD-10-CM | POA: Diagnosis not present

## 2019-05-28 LAB — INFLUENZA PANEL BY PCR (TYPE A & B)
Influenza A By PCR: NEGATIVE
Influenza B By PCR: NEGATIVE

## 2019-05-28 LAB — GROUP A STREP BY PCR: Group A Strep by PCR: NOT DETECTED

## 2019-05-28 NOTE — ED Triage Notes (Signed)
Patient with nasal congestion, sore throat, states that she has had fevers, burning sensation in her face, states that her face feels full.  She has had these symptoms for the last two days.  She has taken alka selter with no relief.

## 2019-05-28 NOTE — ED Provider Notes (Signed)
[facility] Provider Note   CSN: [id number] Arrival date & time: 05/28/19  [id number]     History   Chief Complaint Chief Complaint  Patient presents with   URI    HPI Dana Flores is a 18 y.o. female.     HPI  18 year old female G1, 15P1 smoker here with fever nasal congestion sore throat. 1d of symptoms without change since onset.  No medications prior to arrival.  Sick contacts at home.  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 Authorizing Provider  ferrous sulfate 325 (65 FE) MG tablet Take 1 tablet (325 mg total) by mouth 2 (two) times daily with a meal. 06/08/18   Brock BadHarper, Charles A, MD  ibuprofen (ADVIL,MOTRIN) 600 MG tablet Take 1 tablet (600 mg total) by mouth every 6 (six) hours. 08/23/18   Dollene ClevelandAnderson, Hannah C, DO  omeprazole (PRILOSEC) 20 MG capsule Take 1 capsule (20 mg total) by mouth 2 (two) times daily before a meal. Patient not taking: Reported on 07/26/2018 06/06/18   Brock BadHarper, Charles A, MD  Prenatal Vit-Fe Phos-FA-Omega (VITAFOL GUMMIES) 3.33-0.333-34.8 MG CHEW Chew 3 tablets by mouth daily before breakfast. 03/06/18   Brock BadHarper, Charles A, MD  senna-docusate (SENOKOT-S) 8.6-50 MG tablet Take 2  tablets by mouth daily. 08/23/18   Dollene ClevelandAnderson, Hannah C, DO  sertraline (ZOLOFT) 50 MG tablet Take 1 tablet (50 mg total) by mouth daily. 10/01/18   Rasch, Harolyn RutherfordJennifer I, NP    Family History Family History  Problem Relation Age of Onset   ADD / ADHD Father    Alcohol abuse Father    Asthma Father    Drug abuse Father    Hypertension Father    Stroke Father    ADD / ADHD Brother    Anxiety disorder Brother    Asthma Brother    Heart disease Maternal Grandmother    Obesity Maternal Grandmother    Early death Maternal Grandmother    Diabetes Paternal Grandmother    Hyperlipidemia Paternal Grandmother    Obesity Paternal Grandmother     Social History Social History   Tobacco Use   Smoking status: Current Some Day Smoker    Types: Cigarettes   Smokeless tobacco: Never Used  Substance Use Topics   Alcohol use: No    Frequency: Never   Drug use: Not Currently    Comment: pt. states she hasn't used since 2017     Allergies   Patient has no known  allergies.   Review of Systems Review of Systems  Constitutional: Positive for activity change and fever. Negative for fatigue.  HENT: Positive for congestion and sore throat.   Eyes: Negative for redness and visual disturbance.  Respiratory: Positive for cough and shortness of breath.   Gastrointestinal: Negative for abdominal pain, diarrhea and vomiting.  Genitourinary: Negative for decreased urine volume and dysuria.  Musculoskeletal: Negative for arthralgias and myalgias.  Skin: Negative for rash.  Neurological: Negative for headaches.  All other systems reviewed and are negative.    Physical Exam Updated Vital Signs BP 114/71 (BP Location: Right Arm)    Pulse 81    Temp 98.9 F (37.2 C) (Oral)    Resp 16    Ht 4\' 11"  (1.499 m)    Wt 80.7 kg    SpO2 100%    BMI 35.95 kg/m   Physical Exam Vitals signs and nursing note reviewed.  Constitutional:      General: She is not in acute distress.     Appearance: She is well-developed.  HENT:     Head: Normocephalic and atraumatic.     Right Ear: Tympanic membrane normal.     Left Ear: Tympanic membrane normal.     Nose: No congestion or rhinorrhea.     Mouth/Throat:     Mouth: Mucous membranes are moist.     Pharynx: Oropharyngeal exudate and posterior oropharyngeal erythema present.  Eyes:     Conjunctiva/sclera: Conjunctivae normal.  Neck:     Musculoskeletal: Neck supple. No muscular tenderness.  Cardiovascular:     Rate and Rhythm: Normal rate and regular rhythm.     Heart sounds: No murmur.  Pulmonary:     Effort: Pulmonary effort is normal. No respiratory distress.     Breath sounds: Normal breath sounds.  Abdominal:     Palpations: Abdomen is soft.     Tenderness: There is no abdominal tenderness.  Lymphadenopathy:     Cervical: No cervical adenopathy.  Skin:    General: Skin is warm and dry.     Capillary Refill: Capillary refill takes less than 2 seconds.  Neurological:     General: No focal deficit present.     Mental Status: She is alert and oriented to person, place, and time.  Psychiatric:        Mood and Affect: Mood normal.      ED Treatments / Results  Labs (all labs ordered are listed, but only abnormal results are displayed) Labs Reviewed  GROUP A STREP BY PCR  NOVEL CORONAVIRUS, NAA (HOSP ORDER, SEND-OUT TO REF LAB; TAT 18-24 HRS)  INFLUENZA PANEL BY PCR (TYPE A & B)    EKG None  Radiology No results found.  Procedures Procedures (including critical care time)  Medications Ordered in ED Medications - No data to display   Initial Impression / Assessment and Plan / ED Course  I have reviewed the triage vital signs and the nursing notes.  Pertinent labs & imaging results that were available during my care of the patient were reviewed by me and considered in my medical decision making (see chart for details).        Dana Flores was evaluated in Emergency Department on 05/28/2019  for the symptoms described in the history of present illness. She was evaluated in the context of the global COVID-19 pandemic, which necessitated consideration that the patient might be at risk for infection with the SARS-CoV-2 virus that causes COVID-19. Institutional protocols and algorithms that  pertain to the evaluation of patients at risk for COVID-19 are in a state of rapid change based on information released by regulatory bodies including the CDC and federal and state organizations. These policies and algorithms were followed during the patient's care in the ED.  Ashlin L Conrow is a 18 y.o. female who presents to the ED with a 1 day history of fever, rhinorrhea, and nasal congestion.   On my exam, the patient is well-appearing and well-hydrated.  Tachycardia otherwise the patient's lungs are clear to auscultation bilaterally. Additionally, the patient has a soft/non-tender abdomen, clear tympanic membranes, and no oropharyngeal exudates.  There are no signs of meningismus.  I see no signs of an acute bacterial infection.  The patient's presentation is most consistent with a Viral Upper Respiratory Infection.  I have a low suspicion for Pneumonia as the patient's cough has been non-productive and the patient is neither tachypneic nor hypoxic on room air.  Additionally, the patient is afebrile here.  Sore throat and exudate strep obtained and returned negative. Influenza negative.  Strep negative.  COVID pending.  On reassessment comfortable on room air.  Improved HR. Tolerating PO.  OK for discharge.  I discussed symptomatic management, including hydration, motrin, and tylenol. The patient felt safe being discharged from the ED.  They agreed to followup with the PCP if needed.  I provided ED return precautions.   Final Clinical Impressions(s) / ED Diagnoses   Final diagnoses:  Flu-like symptoms    ED Discharge Orders    None       Brent Bulla, MD 05/28/19 [phone]

## 2019-05-30 LAB — NOVEL CORONAVIRUS, NAA (HOSP ORDER, SEND-OUT TO REF LAB; TAT 18-24 HRS): SARS-CoV-2, NAA: NOT DETECTED

## 2019-11-26 IMAGING — US US OB < 14 WEEKS - US OB TV
1 series · 15 of 28 positions shown · non-contrast
Comparison: None.

CLINICAL DATA: Pregnant patient with pain

EXAM:
OBSTETRIC <14 WK ULTRASOUND
TECHNIQUE: Transabdominal ultrasound was performed for evaluation of the
gestation as well as the maternal uterus and adnexal regions.

[Series 1: us ob < 14 weeks - us ob tv · 15 of 28 slices shown]
[im 1/28]
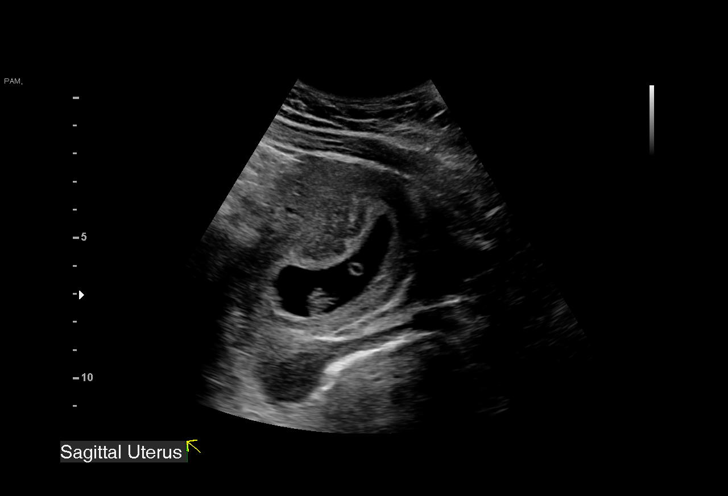
[im 3/28]
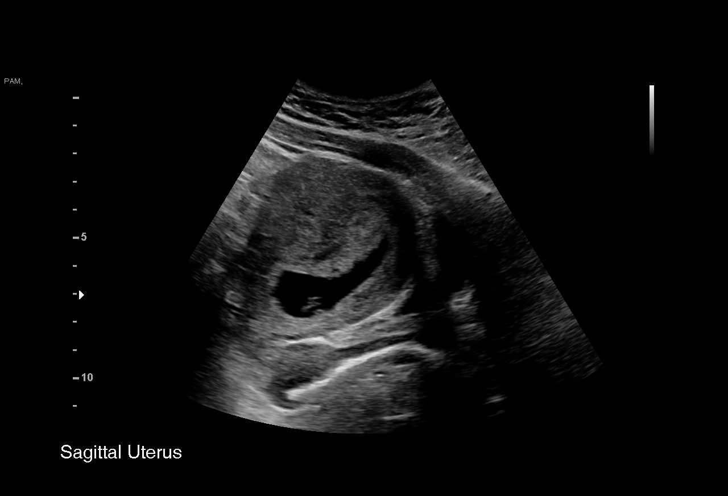
[im 5/28]
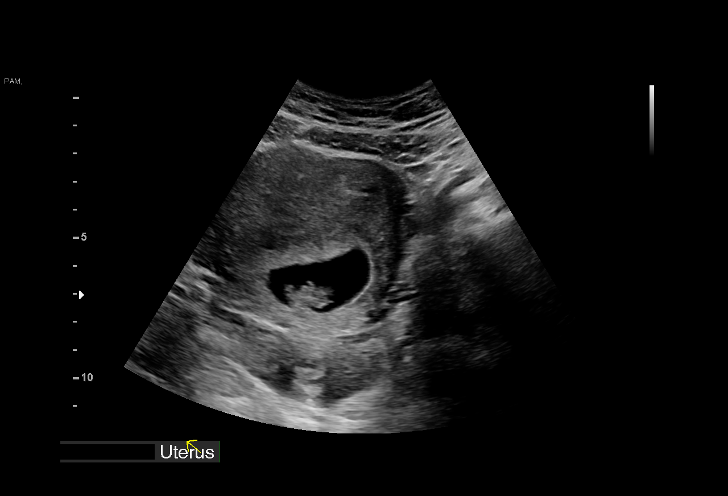
[im 7/28]
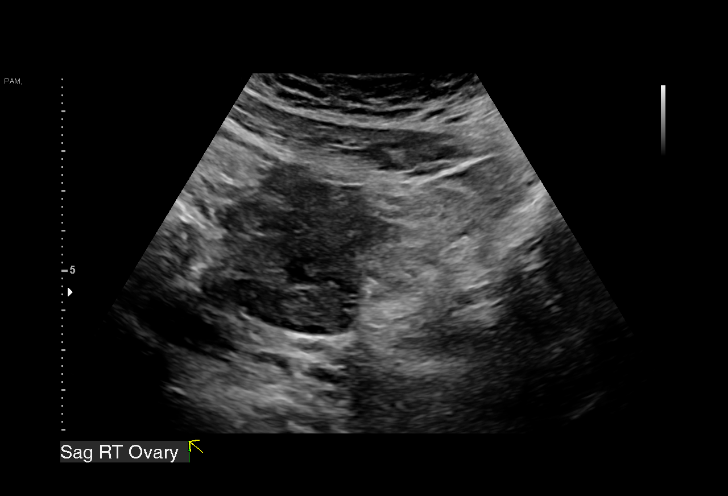
[im 9/28]
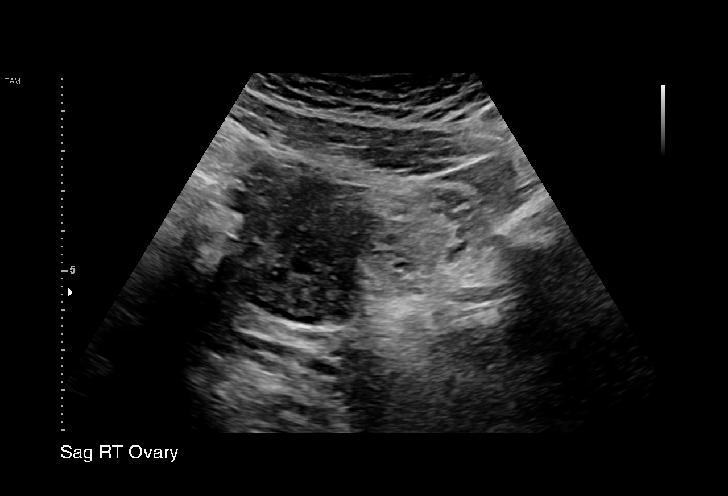
[im 11/28]
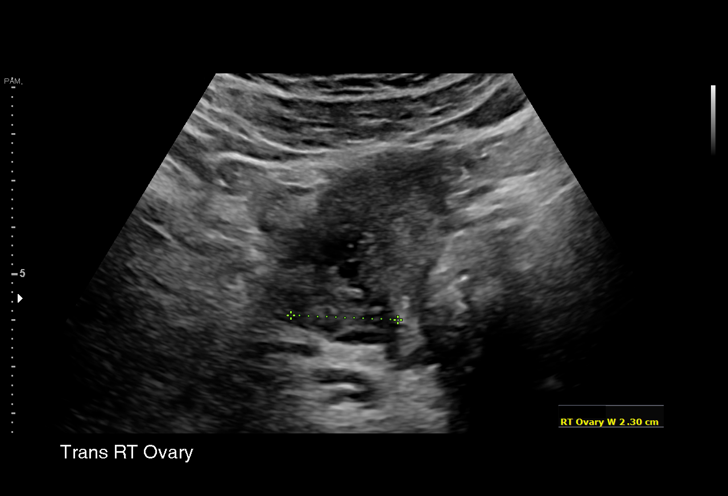
[im 13/28]
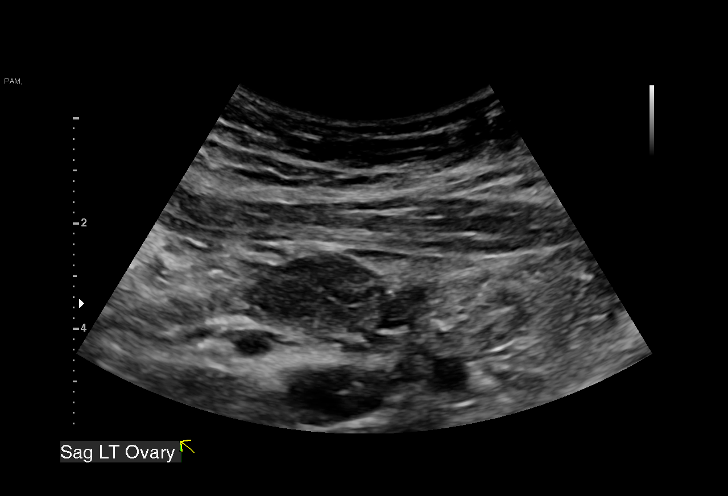
[im 15/28]
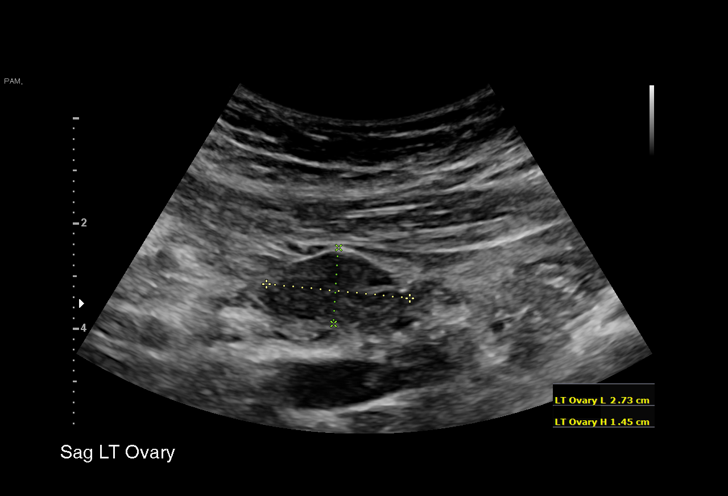
[im 16/28]
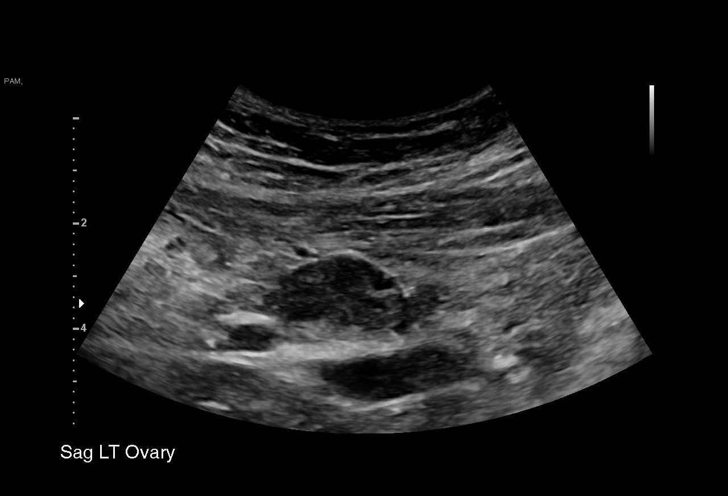
[im 18/28]
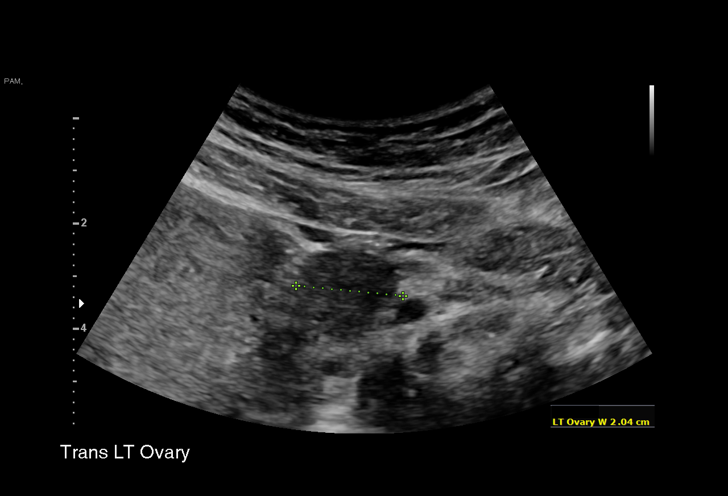
[im 20/28]
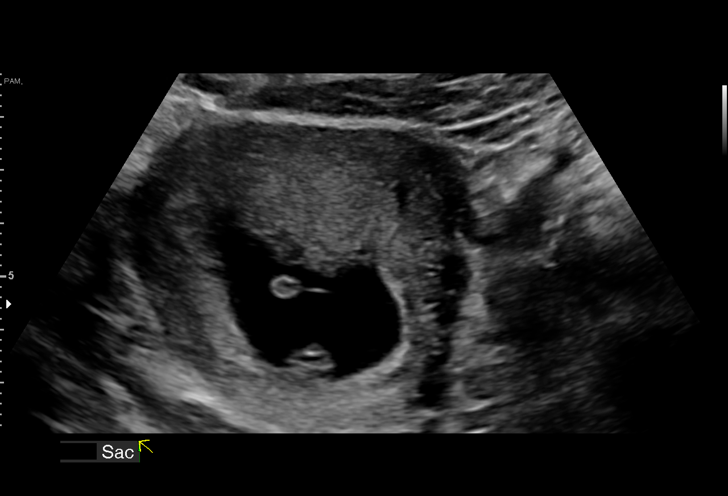
[im 22/28]
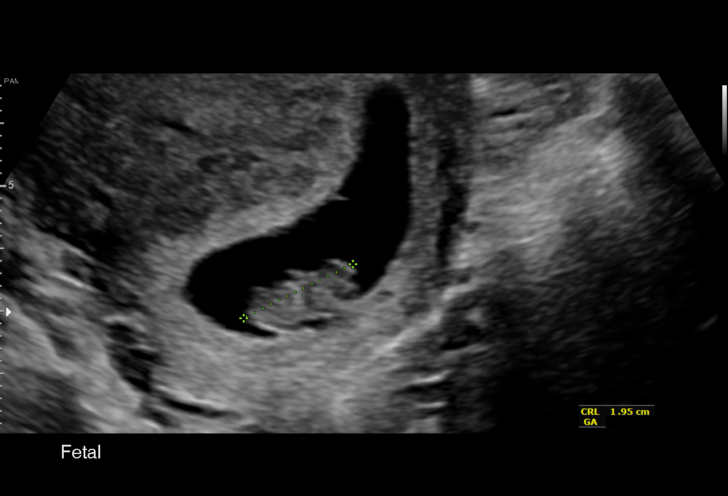
[im 24/28]
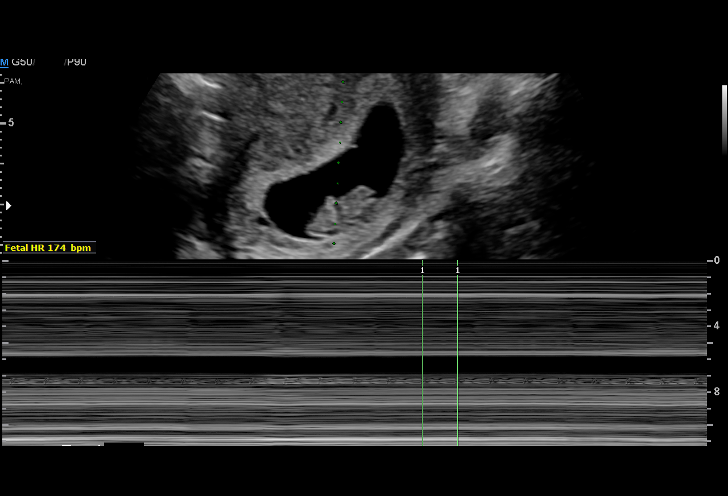
[im 26/28]
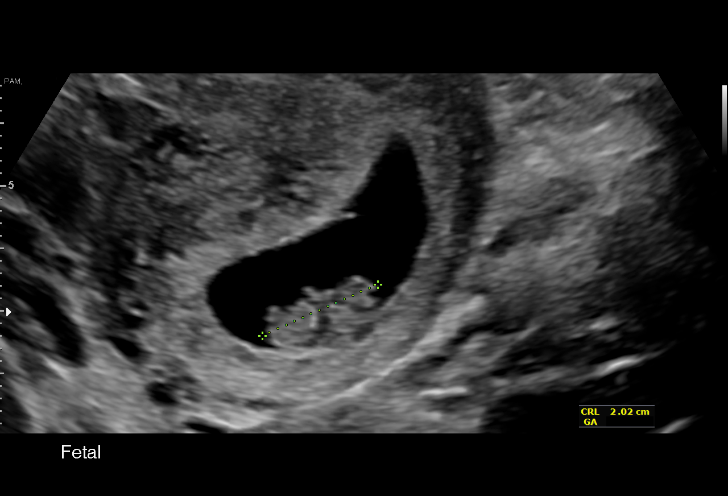
[im 28/28]
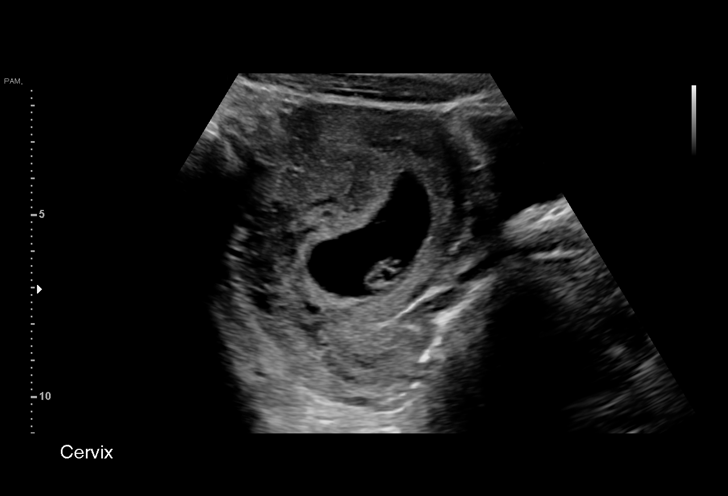

[15 of 28 positions shown; findings below may reference images not displayed]

FINDINGS: Intrauterine gestational sac: Single

Yolk sac:  Visualized.

Embryo:  Visualized.

Cardiac Activity: Visualized.

Heart Rate: 174 bpm

MSD:   mm    w     d

CRL:   20.1 mm   8 w 4 d                  US EDC: August 21, 2018

Subchorionic hemorrhage:  None visualized.

Maternal uterus/adnexae: Normal appearing ovaries.
IMPRESSION: Single live IUP.

## 2020-03-18 ENCOUNTER — Ambulatory Visit (INDEPENDENT_AMBULATORY_CARE_PROVIDER_SITE_OTHER): Payer: Medicaid Other

## 2020-03-18 ENCOUNTER — Other Ambulatory Visit: Payer: Self-pay

## 2020-03-18 VITALS — BP 110/71 | HR 74 | Wt 169.6 lb

## 2020-03-18 DIAGNOSIS — Z3687 Encounter for antenatal screening for uncertain dates: Secondary | ICD-10-CM

## 2020-03-18 DIAGNOSIS — Z3491 Encounter for supervision of normal pregnancy, unspecified, first trimester: Secondary | ICD-10-CM | POA: Diagnosis not present

## 2020-03-18 DIAGNOSIS — O3680X0 Pregnancy with inconclusive fetal viability, not applicable or unspecified: Secondary | ICD-10-CM | POA: Diagnosis not present

## 2020-03-18 DIAGNOSIS — Z3480 Encounter for supervision of other normal pregnancy, unspecified trimester: Secondary | ICD-10-CM | POA: Insufficient documentation

## 2020-03-18 DIAGNOSIS — O99611 Diseases of the digestive system complicating pregnancy, first trimester: Secondary | ICD-10-CM

## 2020-03-18 DIAGNOSIS — N912 Amenorrhea, unspecified: Secondary | ICD-10-CM

## 2020-03-18 DIAGNOSIS — O219 Vomiting of pregnancy, unspecified: Secondary | ICD-10-CM

## 2020-03-18 MED ORDER — DOXYLAMINE-PYRIDOXINE 10-10 MG PO TBEC: DELAYED_RELEASE_TABLET | ORAL | 6 refills | Status: DC

## 2020-03-18 MED ORDER — BLOOD PRESSURE KIT DEVI: 1.0000 | 0 refills | Status: DC

## 2020-03-18 MED ORDER — DOCUSATE SODIUM 100 MG PO CAPS: 100.0000 mg | ORAL_CAPSULE | Freq: Two times a day (BID) | ORAL | 2 refills | Status: DC | PRN

## 2020-03-18 NOTE — Progress Notes (Signed)
Patient was assessed and managed by nursing staff during this encounter. I have reviewed the chart and agree with the documentation and plan. I have also made any necessary editorial changes.  Warden Fillers, MD 03/18/2020 1:10 PM

## 2020-03-18 NOTE — Progress Notes (Signed)
.   PRENATAL INTAKE SUMMARY  Ms. Degner presents today New OB Nurse Interview.  OB History    Gravida  1   Para  1   Term  1   Preterm  0   AB  0   Living  1     SAB  0   TAB  0   Ectopic  0   Multiple  0   Live Births  1          I have reviewed the patient's medical, obstetrical, social, and family histories, medications, and available lab results.  SUBJECTIVE She has no unusual complaints and complains of nausea with vomiting for about a month and also complains of having some constipation. Patient had a confirmed pregnancy test at [facility] on 7/13. Rx for Diclegis and colace sent to the pharmacy.  OBJECTIVE Initial Physical Exam (New OB)  GENERAL APPEARANCE: alert, well appearing  PHQ9 score: 11   ASSESSMENT Normal pregnancy  PLAN Prenatal care New OB labs will be completed at [place] scripts ordered Blood pressure kit ordered  U/S today reveals 107w0dsingle live IUP by CRL. FHR 171  Dates are not consistent with LMP. EDD updated according to U/S.  .Marland KitchenATING AND VIABILITY SONOGRAM   Shaylinn L CBiltonis a 19y.o. year old GG61P1011with LMP Patient's last menstrual period was 01/02/2020. which would correlate to  118w6deeks gestation.  She has regular menstrual cycles.   She is here today for a confirmatory initial sonogram.    GESTATION: 8w830w0d u/s today SINGLETON     FETAL ACTIVITY:          Heart rate         171          The fetus is inactive.  Gestational criteria: Estimated Date of Delivery: 10/08/20 by LMP now at 10w37w6devious Scans:0      CROWN RUMP LENGTH                 30w0d34w0d                                                                          AVERAGE EGA(BY THIS SCAN):  [redacted]w[redacted]d 105w0dING EDD( early ultrasound ):  10/28/20     TECHNICIAN COMMENTS:  Single live IUP at [redacted]w[redacted]d b62w0d.   [place] of this report including all images has been saved and backed up to a second source for  retrieval if needed. All measures and details of the anatomical scan, placentation, fluid volume and pelvic anatomy are contained in that report.  Macklyn Glandon JLynnell Catalan8/11/2019 9:34 AM

## 2020-03-21 ENCOUNTER — Encounter (HOSPITAL_COMMUNITY): Payer: Self-pay | Admitting: Obstetrics & Gynecology

## 2020-03-21 ENCOUNTER — Inpatient Hospital Stay (HOSPITAL_COMMUNITY)
Admission: AD | Admit: 2020-03-21 | Discharge: 2020-03-21 | Disposition: A | Discharge status: Home / Self Care | Payer: Medicaid Other | Attending: Obstetrics & Gynecology | Admitting: Obstetrics & Gynecology

## 2020-03-21 ENCOUNTER — Other Ambulatory Visit: Payer: Self-pay

## 2020-03-21 DIAGNOSIS — O99341 Other mental disorders complicating pregnancy, first trimester: Secondary | ICD-10-CM | POA: Insufficient documentation

## 2020-03-21 DIAGNOSIS — Z79899 Other long term (current) drug therapy: Secondary | ICD-10-CM | POA: Diagnosis not present

## 2020-03-21 DIAGNOSIS — Z3A08 8 weeks gestation of pregnancy: Secondary | ICD-10-CM | POA: Diagnosis not present

## 2020-03-21 DIAGNOSIS — O210 Mild hyperemesis gravidarum: Secondary | ICD-10-CM | POA: Diagnosis present

## 2020-03-21 DIAGNOSIS — F209 Schizophrenia, unspecified: Secondary | ICD-10-CM | POA: Insufficient documentation

## 2020-03-21 DIAGNOSIS — O99331 Smoking (tobacco) complicating pregnancy, first trimester: Secondary | ICD-10-CM | POA: Insufficient documentation

## 2020-03-21 DIAGNOSIS — F17290 Nicotine dependence, other tobacco product, uncomplicated: Secondary | ICD-10-CM | POA: Diagnosis not present

## 2020-03-21 DIAGNOSIS — O219 Vomiting of pregnancy, unspecified: Secondary | ICD-10-CM | POA: Diagnosis not present

## 2020-03-21 LAB — URINALYSIS, ROUTINE W REFLEX MICROSCOPIC
Bacteria, UA: NONE SEEN
Bilirubin Urine: NEGATIVE
Glucose, UA: NEGATIVE mg/dL
Hgb urine dipstick: NEGATIVE
Ketones, ur: 80 mg/dL — AB
Leukocytes,Ua: NEGATIVE
Nitrite: NEGATIVE
Protein, ur: 30 mg/dL — AB
Specific Gravity, Urine: 1.03 (ref 1.005–1.030)
pH: 5 (ref 5.0–8.0)

## 2020-03-21 MED ORDER — FAMOTIDINE IN NACL 20-0.9 MG/50ML-% IV SOLN
20.0000 mg | Freq: Once | INTRAVENOUS | Status: AC
  Administered 2020-03-21: 20 mg via INTRAVENOUS
  Filled 2020-03-21: qty 50

## 2020-03-21 MED ORDER — PROMETHAZINE HCL 25 MG/ML IJ SOLN
25.0000 mg | Freq: Once | INTRAMUSCULAR | Status: AC
  Administered 2020-03-21: 25 mg via INTRAVENOUS
  Filled 2020-03-21: qty 1

## 2020-03-21 MED ORDER — LACTATED RINGERS IV BOLUS
1000.0000 mL | Freq: Once | INTRAVENOUS | Status: AC
  Administered 2020-03-21: 1000 mL via INTRAVENOUS

## 2020-03-21 NOTE — Discharge Instructions (Signed)

## 2020-03-21 NOTE — MAU Note (Signed)
Pt reports to MAU c/o severe abdominal pain around her umbilicus to her upper abdomen. Pt states it feels as if some one is pulling something. Pt states it feels better if she holds her breath. Pt reports severe NV. Pt is not taking medications prescribed at her appt yet. No bleeding or discharge. Pt reports her vomit is dark brown.

## 2020-03-21 NOTE — MAU Provider Note (Addendum)
History     CSN: [id number]  Arrival date and time: 03/21/20 1900   First Provider Initiated Contact with Patient 03/21/20 1924      Chief Complaint  Patient presents with  . Emesis   HPI Dana Flores is a 19 y.o. G3P1011 at 44w3dwho presents with nausea and vomiting. She states she has had this for the entire pregnancy but it got worse last night. She was prescribed diclegis but has not picked it up. Yesterday she ate a plum and a cheeseburger yesterday and then started vomiting last night. She states she has vomited 5 times in the last 24 hours. She has upper abdominal and epigastric pain that she rates a 4/10. She denies any vaginal bleeding or discharge.   OB History    Gravida  3   Para  1   Term  1   Preterm  0   AB  1   Living  1     SAB  0   TAB  1   Ectopic  0   Multiple  0   Live Births  1           Past Medical History:  Diagnosis Date  . Sch[place]ia spectrum disorder with psychotic disorder [place] not yet determined [place]  . Umbilical hernia     Past Surgical History:  Procedure Laterality Date  . TOOTH EXTRACTION  2014  . UMBILICAL HERNIA REPAIR      Family History  Problem Relation Age of Onset  . ADD / ADHD Father   . Alcohol abuse Father   . Asthma Father   . Drug abuse Father   . Hypertension Father   . Stroke Father   . ADD / ADHD Brother   . Anxiety disorder Brother   . Asthma Brother   . Heart disease Maternal Grandmother   . Obesity Maternal Grandmother   . Early death Maternal Grandmother   . Diabetes Paternal Grandmother   . Hyperlipidemia Paternal Grandmother   . Obesity Paternal Grandmother     Social History   Tobacco Use  . Smoking status: Current Every Day Smoker    Types: E-cigarettes  . Smokeless tobacco: Never Used  . Tobacco comment: last used a month ago  Vaping Use  . Vaping Use: Never used  Substance Use Topics  . Alcohol use: Not Currently    Comment: last drink was a month ago  .  Drug use: Not Currently    Comment: pt. states she hasn't used since 2017    Allergies: No Known Allergies  Medications Prior to Admission  Medication Sig Dispense Refill Last Dose  . ondansetron (ZOFRAN) 4 MG tablet Take 4 mg by mouth every 8 (eight) hours as needed for nausea or vomiting.   03/21/2020 at Unknown time  . Prenatal Vit-Fe Phos-FA-Omega (VITAFOL GUMMIES) 3.33-0.333-34.8 MG CHEW Chew 3 tablets by mouth daily before breakfast. 90 tablet 11 Past Month at Unknown time  . Blood Pressure Monitoring (BLOOD PRESSURE KIT) DEVI 1 kit by Does not apply route once a week. 1 each 0   . docusate sodium (COLACE) 100 MG capsule Take 1 capsule (100 mg total) by mouth 2 (two) times daily as needed. 30 capsule 2   . Doxylamine-Pyridoxine (DICLEGIS) 10-10 MG TBEC Take 2 tabs qhs then add 1 tab A.M and midday prn 90 tablet 6     Review of Systems  Constitutional: Negative.  Negative for fatigue and fever.  HENT: Negative.  Respiratory: Negative.  Negative for shortness of breath.   Cardiovascular: Negative.  Negative for chest pain.  Gastrointestinal: Positive for nausea and vomiting. Negative for abdominal pain, constipation and diarrhea.  Genitourinary: Negative.  Negative for dysuria, vaginal bleeding and vaginal discharge.  Neurological: Negative.  Negative for dizziness and headaches.   Physical Exam   Blood pressure 119/60, pulse (!) 112, temperature 98.8 F (37.1 C), resp. rate 16, last menstrual period 01/02/2020, not currently breastfeeding.  Physical Exam Vitals and nursing note reviewed.  Constitutional:      General: She is not in acute distress.    Appearance: She is well-developed.  HENT:     Head: Normocephalic.  Eyes:     Pupils: Pupils are equal, round, and reactive to light.  Cardiovascular:     Rate and Rhythm: Normal rate and regular rhythm.     Heart sounds: Normal heart sounds.  Pulmonary:     Effort: Pulmonary effort is normal. No respiratory distress.      Breath sounds: Normal breath sounds.  Abdominal:     General: Bowel sounds are normal. There is no distension.     Palpations: Abdomen is soft.     Tenderness: There is no abdominal tenderness.  Skin:    General: Skin is warm and dry.  Neurological:     Mental Status: She is alert and oriented to person, place, and time.  Psychiatric:        Behavior: Behavior normal.        Thought Content: Thought content normal.        Judgment: Judgment normal.    MAU Course  Procedures No results found for this or any previous visit (from the past 24 hour(s)).   MDM UA LR bolus Phenergan IV Pepcid IV  Care turned over to Dana Flores CNM at 2000. Dana Flores, CNM 03/21/20   Assessment and Plan  Morning sickness - Plan: Discharge patient - Advised to pick up Rx's and take as directed - Information provided on morning sickness - Schedule [facility] or keep scheduled OB appts - Patient verbalized an understanding of the plan of care and agrees.   Dana Flores, CNM  03/21/2020 10:00 PM

## 2020-03-26 ENCOUNTER — Encounter: Payer: Medicaid Other | Admitting: Family Medicine

## 2020-04-07 ENCOUNTER — Telehealth: Payer: Self-pay

## 2020-04-07 NOTE — Telephone Encounter (Signed)
Received VM from pt  No answer - unable to LVM

## 2020-04-08 ENCOUNTER — Encounter (HOSPITAL_COMMUNITY): Payer: Self-pay | Admitting: Obstetrics and Gynecology

## 2020-04-08 ENCOUNTER — Inpatient Hospital Stay (HOSPITAL_COMMUNITY)
Admission: AD | Admit: 2020-04-08 | Discharge: 2020-04-08 | Disposition: A | Discharge status: Home / Self Care | Payer: Medicaid Other | Attending: Obstetrics and Gynecology | Admitting: Obstetrics and Gynecology

## 2020-04-08 ENCOUNTER — Other Ambulatory Visit: Payer: Self-pay

## 2020-04-08 DIAGNOSIS — Z3A11 11 weeks gestation of pregnancy: Secondary | ICD-10-CM | POA: Diagnosis not present

## 2020-04-08 DIAGNOSIS — R82998 Other abnormal findings in urine: Secondary | ICD-10-CM | POA: Insufficient documentation

## 2020-04-08 DIAGNOSIS — O209 Hemorrhage in early pregnancy, unspecified: Secondary | ICD-10-CM | POA: Insufficient documentation

## 2020-04-08 DIAGNOSIS — R102 Pelvic and perineal pain: Secondary | ICD-10-CM | POA: Insufficient documentation

## 2020-04-08 DIAGNOSIS — O26891 Other specified pregnancy related conditions, first trimester: Secondary | ICD-10-CM | POA: Diagnosis not present

## 2020-04-08 LAB — URINALYSIS, ROUTINE W REFLEX MICROSCOPIC
Bilirubin Urine: NEGATIVE
Glucose, UA: NEGATIVE mg/dL
Ketones, ur: 80 mg/dL — AB
Nitrite: NEGATIVE
Protein, ur: NEGATIVE mg/dL
Specific Gravity, Urine: 1.031 — ABNORMAL HIGH (ref 1.005–1.030)
pH: 5 (ref 5.0–8.0)

## 2020-04-08 NOTE — MAU Note (Addendum)
I had spotting a day ago. Today it is pink and red. Today I am having some lower abd pain. Abd is sore and constant pain. Had BM today but states she is constipated and Bm was "forced"

## 2020-04-08 NOTE — Discharge Instructions (Signed)

## 2020-04-09 ENCOUNTER — Other Ambulatory Visit (HOSPITAL_COMMUNITY)
Admission: RE | Admit: 2020-04-09 | Discharge: 2020-04-09 | Disposition: A | Discharge status: Home / Self Care | Payer: Medicaid Other | Source: Ambulatory Visit | Attending: Family Medicine | Admitting: Family Medicine

## 2020-04-09 ENCOUNTER — Ambulatory Visit (INDEPENDENT_AMBULATORY_CARE_PROVIDER_SITE_OTHER): Payer: Medicaid Other | Admitting: Family Medicine

## 2020-04-09 VITALS — BP 118/76 | HR 101 | Wt 167.1 lb

## 2020-04-09 DIAGNOSIS — Z3A11 11 weeks gestation of pregnancy: Secondary | ICD-10-CM

## 2020-04-09 DIAGNOSIS — Z3491 Encounter for supervision of normal pregnancy, unspecified, first trimester: Secondary | ICD-10-CM

## 2020-04-09 LAB — CULTURE, OB URINE: Culture: <10000 — AB

## 2020-04-09 LAB — GC/CHLAMYDIA PROBE AMP (~~LOC~~) NOT AT ARMC
Chlamydia: NEGATIVE
Comment: NEGATIVE
Comment: NORMAL
Neisseria Gonorrhea: NEGATIVE

## 2020-04-09 MED ORDER — PRENATAL VITAMIN 27-0.8 MG PO TABS: 1.0000 | ORAL_TABLET | Freq: Every day | ORAL | 11 refills | Status: DC

## 2020-04-09 MED ORDER — BLOOD PRESSURE KIT DEVI: 1.0000 | 0 refills | Status: DC

## 2020-04-09 NOTE — Progress Notes (Signed)
History:   Dana Flores is a 19 y.o. G3P1011 at 55w1dby LMP being seen today for her first obstetrical visit.  Her obstetrical history is significant for none. Patient does intend to breast feed. Pregnancy history fully reviewed.  Patient reports no complaints.      HISTORY: OB History  Gravida Para Term Preterm AB Living  _0 0 1 1  SAB TAB Ectopic Multiple Live Births  0 1 0 0 1    # Outcome Date GA Lbr Len/2nd Weight Sex Delivery Anes PTL Lv  3 Current           2 TAB 10/2019          1 Term 08/21/18 431w0d1:53 / 01:16 7 lb 2.8 oz (3.255 kg) M Vag-Spont EPI  LIV     Name: Myrick,BOY Fredrick     Apgar1: 9  Apgar5: 9    No prior pap smears given patient age.  Past Medical History:  Diagnosis Date  . Schizophrenia spectrum disorder with psychotic disorder type not yet determined [place]  . Umbilical hernia    Past Surgical History:  Procedure Laterality Date  . TOOTH EXTRACTION  2014  . UMBILICAL HERNIA REPAIR     Family History  Problem Relation Age of Onset  . ADD / ADHD Father   . Alcohol abuse Father   . Asthma Father   . Drug abuse Father   . Hypertension Father   . Stroke Father   . ADD / ADHD Brother   . Anxiety disorder Brother   . Asthma Brother   . Heart disease Maternal Grandmother   . Obesity Maternal Grandmother   . Early death Maternal Grandmother   . Diabetes Paternal Grandmother   . Hyperlipidemia Paternal Grandmother   . Obesity Paternal Grandmother    Social History   Tobacco Use  . Smoking status: Current Every Day Smoker    Types: E-cigarettes  . Smokeless tobacco: Never Used  . Tobacco comment: last used a month ago  Vaping Use  . Vaping Use: Never used  Substance Use Topics  . Alcohol use: Not Currently    Comment: last drink was a month ago  . Drug use: Not Currently    Comment: pt. states she hasn't used since 2017   No Known Allergies Current Outpatient Medications on File Prior to Visit  Medication Sig  Dispense Refill  . Prenatal Vit-Fe Phos-FA-Omega (VITAFOL GUMMIES) 3.33-0.333-34.8 MG CHEW Chew 3 tablets by mouth daily before breakfast. 90 tablet 11  . docusate sodium (COLACE) 100 MG capsule Take 1 capsule (100 mg total) by mouth 2 (two) times daily as needed. (Patient not taking: Reported on 04/09/2020) 30 capsule 2  . Doxylamine-Pyridoxine (DICLEGIS) 10-10 MG TBEC Take 2 tabs qhs then add 1 tab A.M and midday prn (Patient not taking: Reported on 04/09/2020) 90 tablet 6  . ondansetron (ZOFRAN) 4 MG tablet Take 4 mg by mouth every 8 (eight) hours as needed for nausea or vomiting. (Patient not taking: Reported on 04/09/2020)     No current facility-administered medications on file prior to visit.    Review of Systems Pertinent items noted in HPI and remainder of comprehensive ROS otherwise negative. Physical Exam:   Vitals:   04/09/20 1432  BP: 118/76  Pulse: (!) 101  Weight: 167 lb 1.6 oz (75.8 kg)   Fetal Heart Rate (bpm): 161  Uterus:     Pelvic Exam: Perineum: no hemorrhoids, normal perineum  Vulva: normal external genitalia, no lesions   Vagina:  normal mucosa, normal discharge   Cervix: no lesions and normal, pap smear done.    Adnexa: normal adnexa and no mass, fullness, tenderness   Bony Pelvis: average  System: General: well-developed, well-nourished female in no acute distress   Breasts:  normal appearance, no masses or tenderness bilaterally   Skin: normal coloration and turgor, no rashes   Neurologic: oriented, normal, negative, normal mood   Extremities: normal strength, tone, and muscle mass, ROM of all joints is normal   HEENT PERRLA, extraocular movement intact and sclera clear, anicteric   Mouth/Teeth mucous membranes moist, pharynx normal without lesions and dental hygiene good   Neck supple and no masses   Cardiovascular: regular rate and rhythm   Respiratory:  no respiratory distress, normal breath sounds   Abdomen: soft, non-tender; bowel sounds normal; no  masses,  no organomegaly    Assessment:    Pregnancy: [id number] Patient Active Problem List   Diagnosis Date Noted  . Encounter for supervision of normal pregnancy, unspecified, first trimester 03/18/2020  . 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 Postpartum anxiety 10/01/2018  . Smoker 09/26/2018  . Obesity (BMI 30.0-34.9) 04/03/2018  . History of umbilical hernia repair 60/73/7106     Plan:    1. Encounter for supervision of normal pregnancy in first trimester, unspecified gravidity Doing well without complaints. PNV rx sent to pharmacy. - Blood Pressure Monitoring (BLOOD PRESSURE KIT) DEVI; 1 kit by Does not apply route once a week.  Dispense: 1 each; Refill: 0   Initial labs drawn. Continue prenatal vitamins. Problem list reviewed and updated. Genetic Screening discussed, NIPS: ordered. Ultrasound discussed; fetal anatomic survey: order at next appt. Anticipatory guidance about prenatal visits given including labs, ultrasounds, and testing. Discussed usage of Babyscripts and virtual visits as additional source of managing and completing prenatal visits in midst of coronavirus and pandemic.   Encouraged to complete MyChart Registration for her ability to review results, send requests, and have questions addressed.  The nature of [place] for [facility] with multiple MDs and Advanced Practice Providers was explained to patient; also emphasized that residents, students are part of our team. Routine obstetric precautions reviewed. Encouraged to seek out care at office or emergency room [facility] MAU preferred) for urgent and/or emergent concerns. No follow-ups on file.     Arrie Senate, MD OB Fellow, [place] for [place] 04/09/2020 3:08 PM

## 2020-04-09 NOTE — Progress Notes (Signed)
NOB in office, reports a little spotting a few days ago, none today.

## 2020-04-10 ENCOUNTER — Other Ambulatory Visit: Payer: Self-pay | Admitting: Family Medicine

## 2020-04-10 DIAGNOSIS — B9689 Other specified bacterial agents as the cause of diseases classified elsewhere: Secondary | ICD-10-CM

## 2020-04-10 LAB — CERVICOVAGINAL ANCILLARY ONLY
Bacterial Vaginitis (gardnerella): POSITIVE — AB
Candida Glabrata: NEGATIVE
Candida Vaginitis: NEGATIVE
Chlamydia: NEGATIVE
Comment: NEGATIVE
Comment: NEGATIVE
Comment: NEGATIVE
Comment: NEGATIVE
Comment: NEGATIVE
Comment: NORMAL
Neisseria Gonorrhea: NEGATIVE
Trichomonas: NEGATIVE

## 2020-04-10 MED ORDER — METRONIDAZOLE 500 MG PO TABS: 500.0000 mg | ORAL_TABLET | Freq: Two times a day (BID) | ORAL | 0 refills | Status: AC

## 2020-04-11 LAB — URINE CULTURE, OB REFLEX

## 2020-04-11 LAB — CULTURE, OB URINE

## 2020-04-14 LAB — CBC/D/PLT+RPR+RH+ABO+RUB AB...
Antibody Screen: NEGATIVE
Basophils Absolute: 0 10*3/uL (ref 0.0–0.2)
Basos: 0 %
EOS (ABSOLUTE): 0.1 10*3/uL (ref 0.0–0.4)
Eos: 1 %
HCV Ab: <0.1 s/co ratio (ref 0.0–0.9)
HIV Screen 4th Generation wRfx: NONREACTIVE
Hematocrit: 35.7 % (ref 34.0–46.6)
Hemoglobin: 11.9 g/dL (ref 11.1–15.9)
Hepatitis B Surface Ag: NEGATIVE
Immature Grans (Abs): 0 10*3/uL (ref 0.0–0.1)
Immature Granulocytes: 0 %
Lymphocytes Absolute: 2.3 10*3/uL (ref 0.7–3.1)
Lymphs: 25 %
MCH: 26 pg — ABNORMAL LOW (ref 26.6–33.0)
MCHC: 33.3 g/dL (ref 31.5–35.7)
MCV: 78 fL — ABNORMAL LOW (ref 79–97)
Monocytes Absolute: 0.7 10*3/uL (ref 0.1–0.9)
Monocytes: 8 %
Neutrophils Absolute: 6 10*3/uL (ref 1.4–7.0)
Neutrophils: 66 %
Platelets: 254 10*3/uL (ref 150–450)
RBC: 4.57 x10E6/uL (ref 3.77–5.28)
RDW: 16.2 % — ABNORMAL HIGH (ref 11.7–15.4)
RPR Ser Ql: NONREACTIVE
Rh Factor: POSITIVE
Rubella Antibodies, IGG: 1.01 index (ref >0.99)
WBC: 9.2 10*3/uL (ref 3.4–10.8)

## 2020-04-14 LAB — HCV INTERPRETATION

## 2020-04-15 ENCOUNTER — Encounter: Payer: Self-pay | Admitting: Obstetrics and Gynecology

## 2020-04-16 LAB — CYTOLOGY - PAP
Adequacy: ABSENT
Comment: NEGATIVE
Diagnosis: NEGATIVE
High risk HPV: NEGATIVE

## 2020-04-21 ENCOUNTER — Encounter: Payer: Self-pay | Admitting: Obstetrics and Gynecology

## 2020-04-23 ENCOUNTER — Ambulatory Visit (HOSPITAL_COMMUNITY)
Admission: EM | Admit: 2020-04-23 | Discharge: 2020-04-23 | Disposition: A | Discharge status: Home / Self Care | Payer: Medicaid Other

## 2020-04-23 ENCOUNTER — Encounter (HOSPITAL_COMMUNITY): Payer: Self-pay | Admitting: Emergency Medicine

## 2020-04-23 ENCOUNTER — Emergency Department (HOSPITAL_COMMUNITY)
Admission: EM | Admit: 2020-04-23 | Discharge: 2020-04-23 | Disposition: A | Discharge status: Home / Self Care | Payer: Medicaid Other | Attending: Emergency Medicine | Admitting: Emergency Medicine

## 2020-04-23 ENCOUNTER — Other Ambulatory Visit: Payer: Self-pay

## 2020-04-23 DIAGNOSIS — M7989 Other specified soft tissue disorders: Secondary | ICD-10-CM | POA: Diagnosis not present

## 2020-04-23 DIAGNOSIS — H02846 Edema of left eye, unspecified eyelid: Secondary | ICD-10-CM

## 2020-04-23 DIAGNOSIS — Z5321 Procedure and treatment not carried out due to patient leaving prior to being seen by health care provider: Secondary | ICD-10-CM | POA: Insufficient documentation

## 2020-04-23 DIAGNOSIS — L299 Pruritus, unspecified: Secondary | ICD-10-CM | POA: Insufficient documentation

## 2020-04-23 DIAGNOSIS — R2242 Localized swelling, mass and lump, left lower limb: Secondary | ICD-10-CM | POA: Insufficient documentation

## 2020-04-23 MED ORDER — FAMOTIDINE 20 MG PO TABS: 20.0000 mg | ORAL_TABLET | Freq: Two times a day (BID) | ORAL | 0 refills | Status: DC

## 2020-04-23 NOTE — ED Triage Notes (Signed)
Pt here c/o allergic reaction states she is no sure of what, she didn't have nothing new to eat or any medication started with itchiness on her left hand and now is swollen her hand and left eye, pt is not able to open her eyes, taking Benadryl at home with no relive, pt is able to talk in complete sentences, NAD noticed on triage.

## 2020-04-23 NOTE — ED Provider Notes (Signed)
[place]    CSN: [id number] Arrival date & time: 04/23/20  1541      History   Chief Complaint Chief Complaint  Patient presents with  . Eye Problem    HPI Dana Flores is a 19 y.o. female.   Patient presenting today with marked left eyelid and left hand and wrist swelling x 2 days. Thinks she was stung by something in these areas, though she didn't see this happen. Has had this happen in the past from insect stings. Denies fever, chills, wheezing, SOB, dysphagia/throat fullness, blurry vision, eye pain. So far has been taking benadryl twice daily without much relief. Is currently 3 months pregnant.      Past Medical History:  Diagnosis Date  . Schizophrenia spectrum disorder with psychotic disorder type not yet determined ([place]) 11/20/2015  . Umbilical hernia     Patient Active Problem List   Diagnosis Date Noted  . Encounter for supervision of normal pregnancy, unspecified, first trimester 03/18/2020  . 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 Postpartum anxiety 10/01/2018  . Smoker 09/26/2018  . Obesity (BMI 30.0-34.9) 04/03/2018  . History of umbilical hernia repair 24/26/8341    Past Surgical History:  Procedure Laterality Date  . TOOTH EXTRACTION  2014  . UMBILICAL HERNIA REPAIR      OB History    Gravida  3   Para  1   Term  1   Preterm  0   AB  1   Living  1     SAB  0   TAB  1   Ectopic  0   Multiple  0   Live Births  1            Home Medications    Prior to Admission medications   Medication Sig Start Date End Date Taking? Authorizing Provider  metroNIDAZOLE (FLAGYL) 500 MG tablet Take 500 mg by mouth 3 (three) times daily.   Yes [provider]  Blood Pressure Monitoring (BLOOD PRESSURE KIT) DEVI 1 kit by Does not apply route once a week. 9/62/22   Arrie Senate, MD  docusate sodium (COLACE) 100 MG capsule Take 1 capsule (100 mg total) by mouth 2 (two) times daily as needed. Patient not taking:  Reported on 04/09/2020 03/18/20   Griffin Basil, MD  Doxylamine-Pyridoxine (DICLEGIS) 10-10 MG TBEC Take 2 tabs qhs then add 1 tab A.M and midday prn Patient not taking: Reported on 04/09/2020 03/18/20   Griffin Basil, MD  famotidine (PEPCID) 20 MG tablet Take 1 tablet (20 mg total) by mouth 2 (two) times daily. 04/23/20   Volney American, PA-C  ondansetron (ZOFRAN) 4 MG tablet Take 4 mg by mouth every 8 (eight) hours as needed for nausea or vomiting. Patient not taking: Reported on 04/09/2020    [provider]  Prenatal Vit-Fe Fumarate-FA (PRENATAL VITAMIN) 27-0.8 MG TABS Take 1 tablet by mouth daily. 9/79/89   Arrie Senate, MD  Prenatal Vit-Fe Phos-FA-Omega (VITAFOL GUMMIES) 3.33-0.333-34.8 MG CHEW Chew 3 tablets by mouth daily before breakfast. 03/06/18   Shelly Bombard, MD    Family History Family History  Problem Relation Age of Onset  . ADD / ADHD Father   . Alcohol abuse Father   . Asthma Father   . Drug abuse Father   . Hypertension Father   . Stroke Father   . ADD / ADHD Brother   . Anxiety disorder Brother   . Asthma Brother   .  Heart disease Maternal Grandmother   . Obesity Maternal Grandmother   . Early death Maternal Grandmother   . Diabetes Paternal Grandmother   . Hyperlipidemia Paternal Grandmother   . Obesity Paternal Grandmother     Social History Social History   Tobacco Use  . Smoking status: Current Every Day Smoker    Types: E-cigarettes  . Smokeless tobacco: Never Used  . Tobacco comment: last used a month ago  Vaping Use  . Vaping Use: Never used  Substance Use Topics  . Alcohol use: Not Currently    Comment: last drink was a month ago  . Drug use: Not Currently    Comment: pt. states she hasn't used since 2017     Allergies   Patient has no known allergies.   Review of Systems Review of Systems PER HPI  Physical Exam Triage Vital Signs ED Triage Vitals  Enc Vitals Group     BP 04/23/20 1802 (!) 97/58      Pulse Rate 04/23/20 1802 67     Resp 04/23/20 1802 17     Temp 04/23/20 1802 99.1 F (37.3 C)     Temp Source 04/23/20 1802 Oral     SpO2 04/23/20 1802 100 %     Weight --      Height --      Head Circumference --      Peak Flow --      Pain Score 04/23/20 1758 3     Pain Loc --      Pain Edu? --      Excl. in [place]? --    No data found.  Updated Vital Signs BP (!) 97/58 (BP Location: Left Arm)   Pulse 67   Temp 99.1 F (37.3 C) (Oral)   Resp 17   LMP 01/02/2020   SpO2 100%   Visual Acuity Right Eye Distance:   Left Eye Distance:   Bilateral Distance:    Right Eye Near:   Left Eye Near:    Bilateral Near:     Physical Exam Vitals and nursing note reviewed.  Constitutional:      Appearance: Normal appearance. She is not ill-appearing.  HENT:     Head: Atraumatic.     Mouth/Throat:     Mouth: Mucous membranes are moist.     Pharynx: Oropharynx is clear. No posterior oropharyngeal erythema.  Eyes:     Extraocular Movements: Extraocular movements intact.     Conjunctiva/sclera: Conjunctivae normal.  Cardiovascular:     Rate and Rhythm: Normal rate and regular rhythm.     Heart sounds: Normal heart sounds.  Pulmonary:     Effort: Pulmonary effort is normal. No respiratory distress.     Breath sounds: Normal breath sounds. No wheezing.  Musculoskeletal:        General: Normal range of motion.     Cervical back: Normal range of motion and neck supple.  Skin:    General: Skin is warm and dry.     Comments: Left upper eyelid edematous and mildly erythematous Left hand and wrist edematous and erythematous with a few minimally visible urticarial patches on wrist  Neurological:     Mental Status: She is alert and oriented to person, place, and time.  Psychiatric:        Mood and Affect: Mood normal.        Thought Content: Thought content normal.        Judgment: Judgment normal.    UC Treatments / Results  Labs (all labs ordered are listed, but only abnormal  results are displayed) Labs Reviewed - No data to display  EKG   Radiology No results found.  Procedures Procedures (including critical care time)  Medications Ordered in UC Medications - No data to display  Initial Impression / Assessment and Plan / UC Course  I have reviewed the triage vital signs and the nursing notes.  Pertinent labs & imaging results that were available during my care of the patient were reviewed by me and considered in my medical decision making (see chart for details).     Allergic reaction, unclear trigger - will avoid corticosteroid therapy due to pregnancy status and treat with famotidine and benadryl BID as well as ice off and on. Discussed strict return precautions if worsening, particularly if throat swelling, SOB, wheezing develops.    Final Clinical Impressions(s) / UC Diagnoses   Final diagnoses:  Swelling of eyelid, left  Swelling of left hand     Discharge Instructions     Take 25 mg of benadryl and 20 mg of famotidine twice daily until the swelling has resolved. Follow up immediately if your symptoms begin to worsen    ED Prescriptions    Medication Sig Dispense Auth. Provider   famotidine (PEPCID) 20 MG tablet Take 1 tablet (20 mg total) by mouth 2 (two) times daily. 20 tablet Volney American, [place]     PDMP not reviewed this encounter.   Volney American, [place] 04/23/20 1924

## 2020-04-23 NOTE — Discharge Instructions (Signed)
Take 25 mg of benadryl and 20 mg of famotidine twice daily until the swelling has resolved. Follow up immediately if your symptoms begin to worsen

## 2020-04-23 NOTE — ED Triage Notes (Signed)
Pt states she woke up yesterday with her left eye swollen shut. She states the eye is very itchy and she also has itching on left hand and arm. Pt states she thinks she had a insect bite.

## 2020-04-30 ENCOUNTER — Telehealth: Payer: Medicaid Other | Admitting: Physician Assistant

## 2020-04-30 DIAGNOSIS — R21 Rash and other nonspecific skin eruption: Secondary | ICD-10-CM

## 2020-04-30 NOTE — Progress Notes (Signed)
For the safety of you and your child, I recommend a face to face office visit with a health care provider.  I think that it is possible that you have Hand, Foot and Mouth disease, however it is important to make sure that there are not any other health issues causing your fever.  Many mothers need to take medicines during their pregnancy and while nursing.  Almost all medicines pass into the breast milk in small quantities.  Most are generally considered safe for a mother to take but some medicines must be avoided.    After reviewing your E-Visit request, I recommend that you consult your OB/GYN or pediatrician for medical advice in relation to your condition and prescription medications while pregnant or breastfeeding.  NOTE: If you entered your credit card information for this eVisit, you will not be charged. You may see a "hold" on your card for the $35 but that hold will drop off and you will not have a charge processed.  If you are having a true medical emergency please call 911.     For an urgent face to face visit, [facility] has four urgent care centers for your convenience:    NEW:  [facility] [place] [phone] [address] [place], [place] [address]   Monday - Friday 10 am - 6 pm      [facility]    [phone]                  Get Driving Directions  [address] [place], [place] [address]  10 am to 8 pm Monday-Friday  12 pm to 8 pm [facility] at [facility]  [phone]                  Get Driving Directions  [address] [place], [place] [address]  8 am to 8 pm Monday-Friday  9 am to 6 pm Saturday  11 am to 6 pm Sunday    [facility] at [facility]  [phone]                  Get Driving Directions   [address] [place], [place] [address]  8 am to 8 pm Monday-Friday  8 am to 4 pm  [facility]gent Care at [facility] Directions  [phone]  [address] [place], [place] [address]   Monday-Friday, 12 PM to 6 PM    Your e-visit answers were reviewed by a board certified advanced clinical practitioner to complete your personal care plan.  Thank you for using e-Visits.    Jarold Motto PA-C

## 2020-05-07 ENCOUNTER — Ambulatory Visit (INDEPENDENT_AMBULATORY_CARE_PROVIDER_SITE_OTHER): Payer: Medicaid Other | Admitting: Nurse Practitioner

## 2020-05-07 ENCOUNTER — Other Ambulatory Visit: Payer: Self-pay

## 2020-05-07 ENCOUNTER — Encounter: Payer: Self-pay | Admitting: Nurse Practitioner

## 2020-05-07 VITALS — BP 102/68 | HR 91 | Wt 168.0 lb

## 2020-05-07 DIAGNOSIS — Z3482 Encounter for supervision of other normal pregnancy, second trimester: Secondary | ICD-10-CM

## 2020-05-07 DIAGNOSIS — Z7189 Other specified counseling: Secondary | ICD-10-CM | POA: Diagnosis not present

## 2020-05-07 DIAGNOSIS — Z3A15 15 weeks gestation of pregnancy: Secondary | ICD-10-CM

## 2020-05-07 DIAGNOSIS — Z3491 Encounter for supervision of normal pregnancy, unspecified, first trimester: Secondary | ICD-10-CM

## 2020-05-07 NOTE — Progress Notes (Signed)
ROB, patient has not picked up her BP Cuff.

## 2020-05-07 NOTE — Progress Notes (Signed)
    Subjective:  Dana Flores is a 19 y.o. G3P1011 at [redacted]w[redacted]d being seen today for ongoing prenatal care.  She is currently monitored for the following issues for this low-risk pregnancy and has History of umbilical hernia repair; Obesity (BMI 30.0-34.9); Postpartum depression; Postpartum anxiety; and Encounter for supervision of normal pregnancy, unspecified, first trimester on their problem list.  Patient reports no complaints.  Contractions: Not present. Vag. Bleeding: None.   . Denies leaking of fluid.   The following portions of the patient's history were reviewed and updated as appropriate: allergies, current medications, past family history, past medical history, past social history, past surgical history and problem list. Problem list updated.  Objective:   Vitals:   05/07/20 1359  BP: 102/68  Pulse: 91  Weight: 168 lb (76.2 kg)    Fetal Status: Fetal Heart Rate (bpm): 154         General:  Alert, oriented and cooperative. Patient is in no acute distress.  Skin: Skin is warm and dry. No rash noted.   Cardiovascular: Normal heart rate noted  Respiratory: Normal respiratory effort, no problems with respiration noted  Abdomen: Soft, gravid, appropriate for gestational age. Pain/Pressure: Absent     Pelvic:  Cervical exam deferred        Extremities: Normal range of motion.  Edema: None  Mental Status: Normal mood and affect. Normal behavior. Normal judgment and thought content.   Urinalysis:      Assessment and Plan:  Pregnancy: G3P1011 at [redacted]w[redacted]d  1. Encounter for supervision of normal pregnancy in first trimester, unspecified gravidity Encouraged client to sign up for childbirth and breastfeeding classes.  - AFP, Serum, Open Spina Bifida - [place] MFM OB DETAIL +14 WK; Future  COVID-19 Vaccine Counseling: The patient was counseled on the potential benefits and lack of known risks of COVID vaccination, during pregnancy and breastfeeding, during today's visit. The patient's  questions and concerns were addressed today, including safety of the vaccination and potential side effects as they have been published by ACOG and SMFM. The patient has been informed that there have not been any documented vaccine related injuries, deaths or birth defects to infant or mom after receiving the COVID-19 vaccine to date. The patient has been made aware that although she is not at increased risk of contracting COVID-19 during pregnancy, she is at increased risk of developing severe disease and complications if she contracts COVID-19 while pregnant. All patient questions were addressed during our visit today. The patient is not planning to get vaccinated at this time.    Preterm labor symptoms and general obstetric precautions including but not limited to vaginal bleeding, contractions, leaking of fluid and fetal movement were reviewed in detail with the patient. Please refer to After Visit Summary for other counseling recommendations.  Return in about 4 weeks (around 06/04/2020) for ROB in person.  Nolene Bernheim, RN, MSN, NP-BC Nurse Practitioner, [facility] for [employer], [facility] 05/07/2020 3:46 PM

## 2020-05-09 LAB — AFP, SERUM, OPEN SPINA BIFIDA
AFP MoM: 0.97
AFP Value: 30.4 ng/mL
Gest. Age on Collection Date: 15.1 weeks
Maternal Age At EDD: 21.7 yr
OSBR Risk 1 IN: 10000
Test Results:: NEGATIVE
Weight: 168 [lb_av]

## 2020-06-03 ENCOUNTER — Other Ambulatory Visit: Payer: Self-pay

## 2020-06-03 ENCOUNTER — Ambulatory Visit (INDEPENDENT_AMBULATORY_CARE_PROVIDER_SITE_OTHER): Payer: Medicaid Other | Admitting: Obstetrics and Gynecology

## 2020-06-03 ENCOUNTER — Encounter: Payer: Self-pay | Admitting: Obstetrics and Gynecology

## 2020-06-03 VITALS — BP 119/72 | HR 91 | Wt 170.4 lb

## 2020-06-03 DIAGNOSIS — Z3480 Encounter for supervision of other normal pregnancy, unspecified trimester: Secondary | ICD-10-CM

## 2020-06-03 DIAGNOSIS — Z3A19 19 weeks gestation of pregnancy: Secondary | ICD-10-CM

## 2020-06-03 MED ORDER — DIGITAL GLASS SCALE MISC: 1.0000 | 0 refills | Status: DC

## 2020-06-03 NOTE — Progress Notes (Signed)
Pt is here for ROB, [redacted]w[redacted]d.

## 2020-06-03 NOTE — Progress Notes (Signed)
   LOW-RISK PREGNANCY OFFICE VISIT Patient name: Dana Flores MRN [id number]  Date of birth: 07/03/2001 Chief Complaint:   Routine Prenatal Visit  History of Present Illness:   Dana Flores is a 19 y.o. G26P1011 female at [redacted]w[redacted]d with an Estimated Date of Delivery: 10/28/20 being seen today for ongoing management of a low-risk pregnancy.  Today she reports no complaints. Contractions: Not present. Vag. Bleeding: None.  Movement: Present. denies leaking of fluid. Review of Systems:   Pertinent items are noted in HPI Denies abnormal vaginal discharge w/ itching/odor/irritation, headaches, visual changes, shortness of breath, chest pain, abdominal pain, severe nausea/vomiting, or problems with urination or bowel movements unless otherwise stated above. Pertinent History Reviewed:  Reviewed past medical,surgical, social, obstetrical and family history.  Reviewed problem list, medications and allergies. Physical Assessment:   Vitals:   06/03/20 1305  BP: 119/72  Pulse: 91  Weight: 170 lb 6.4 oz (77.3 kg)  Body mass index is 34.42 kg/m.        Physical Examination:   General appearance: Well appearing, and in no distress  Mental status: Alert, oriented to person, place, and time  Skin: Warm & dry  Cardiovascular: Normal heart rate noted  Respiratory: Normal respiratory effort, no distress  Abdomen: Soft, gravid, nontender  Pelvic: Cervical exam deferred         Extremities: Edema: None  Fetal Status: Fetal Heart Rate (bpm): 147   Movement: Present    No results found for this or any previous visit (from the past 24 hour(s)).  Assessment & Plan:  1) Low-risk pregnancy G3P1011 at [redacted]w[redacted]d with an Estimated Date of Delivery: 10/28/20   2) Supervision of other normal pregnancy, antepartum - Advised of the optimized OB schedule d/t COVID to limit exposures for patients and staff - Anticipatory guidance for next visit on My Chart in 4 weeks - Anatomy U/S scheduled for 06/05/2020  3) [redacted]  weeks gestation of pregnancy    Meds: No orders of the defined types were placed in this encounter.  Labs/procedures today: none  Plan:  Continue routine obstetrical care   Reviewed: Preterm labor symptoms and general obstetric precautions including but not limited to vaginal bleeding, contractions, leaking of fluid and fetal movement were reviewed in detail with the patient.  All questions were answered.   Follow-up: Return in about 4 weeks (around 07/01/2020) for Return OB - My Chart video.  No orders of the defined types were placed in this encounter.  Raelyn Mora MSN, CNM 06/03/2020 1:13 PM

## 2020-06-03 NOTE — Progress Notes (Signed)
Digital scale prescribed to [facility] for pt.

## 2020-06-05 ENCOUNTER — Ambulatory Visit: Payer: Medicaid Other | Admitting: *Deleted

## 2020-06-05 ENCOUNTER — Other Ambulatory Visit: Payer: Self-pay

## 2020-06-05 ENCOUNTER — Ambulatory Visit: Payer: Medicaid Other | Attending: Nurse Practitioner

## 2020-06-05 ENCOUNTER — Other Ambulatory Visit: Payer: Self-pay | Admitting: *Deleted

## 2020-06-05 ENCOUNTER — Encounter: Payer: Self-pay | Admitting: *Deleted

## 2020-06-05 DIAGNOSIS — Z3480 Encounter for supervision of other normal pregnancy, unspecified trimester: Secondary | ICD-10-CM | POA: Diagnosis present

## 2020-06-05 DIAGNOSIS — Z3491 Encounter for supervision of normal pregnancy, unspecified, first trimester: Secondary | ICD-10-CM | POA: Insufficient documentation

## 2020-06-05 DIAGNOSIS — Z362 Encounter for other antenatal screening follow-up: Secondary | ICD-10-CM

## 2020-06-07 IMAGING — US US MFM OB FOLLOW-UP
1 series · 14 of 28 positions shown · non-contrast
Comparison: none

[Series 1: us mfm ob follow-up · 33 acquisitions, 14 frames shown]
[im 2/33]
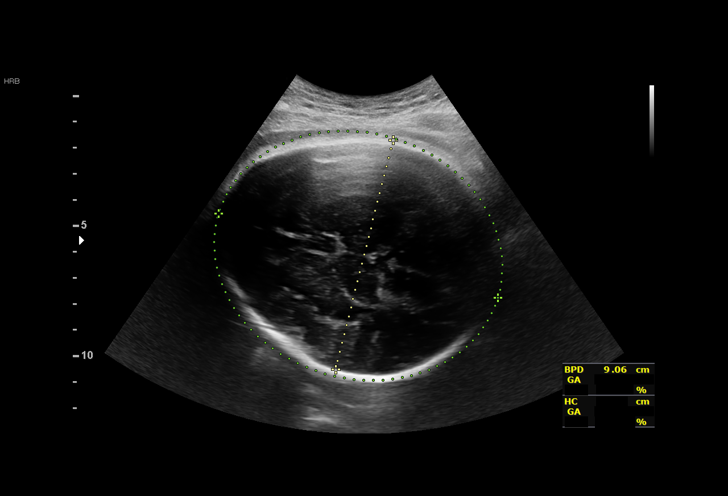
[im 4/33]
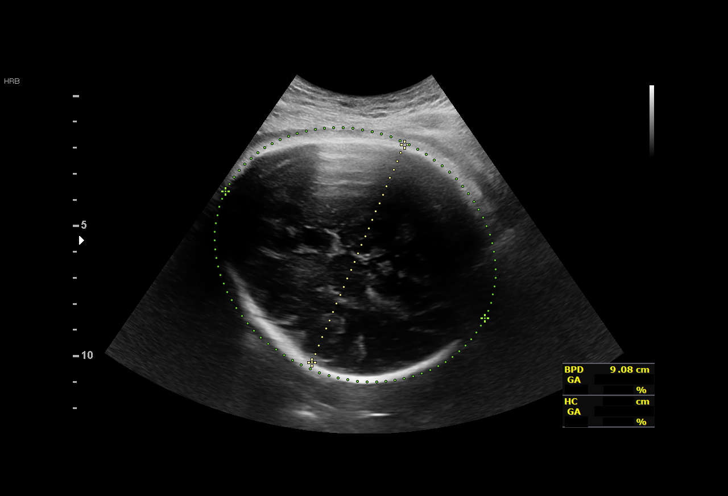
[im 6/33]
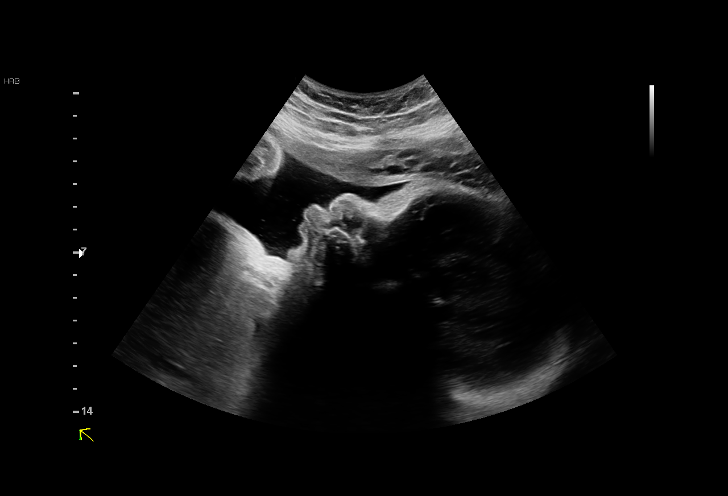
[im 9/33]
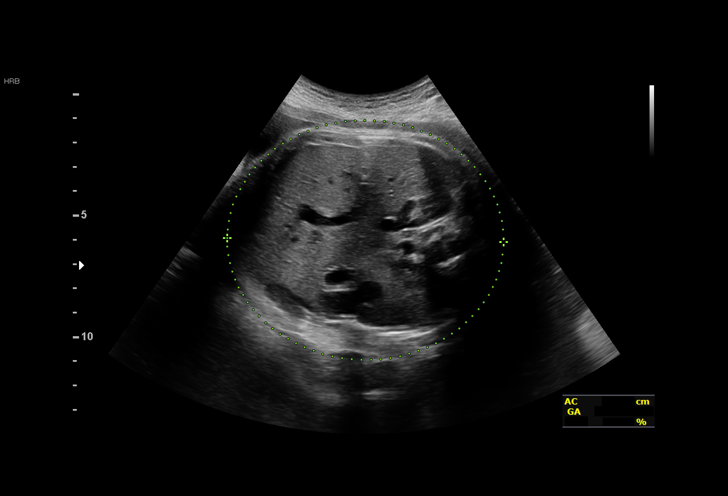
[im 11/33]
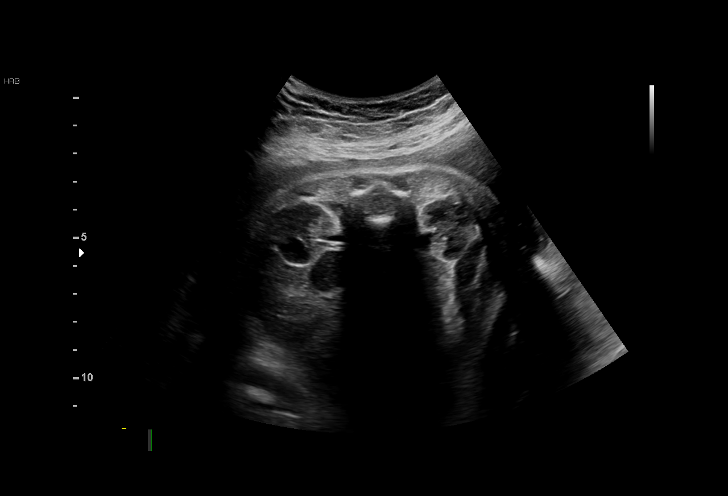
[im 14/33]
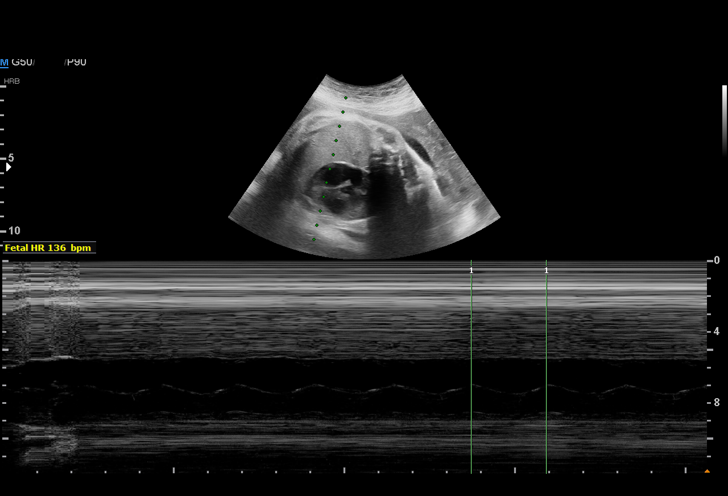
[im 16/33]
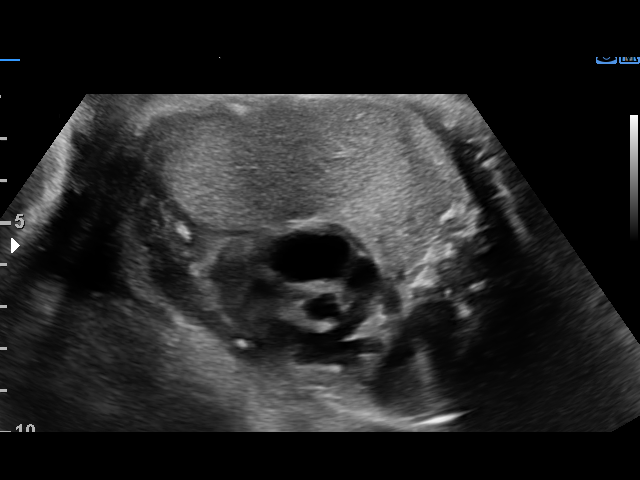
[im 18/33]
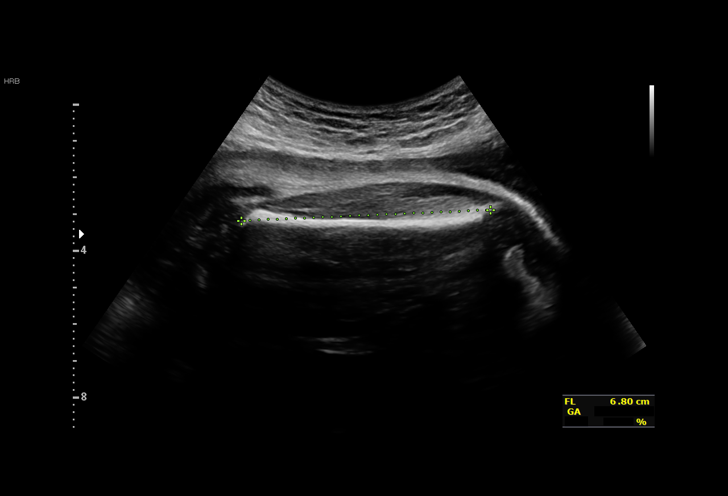
[im 21/33]
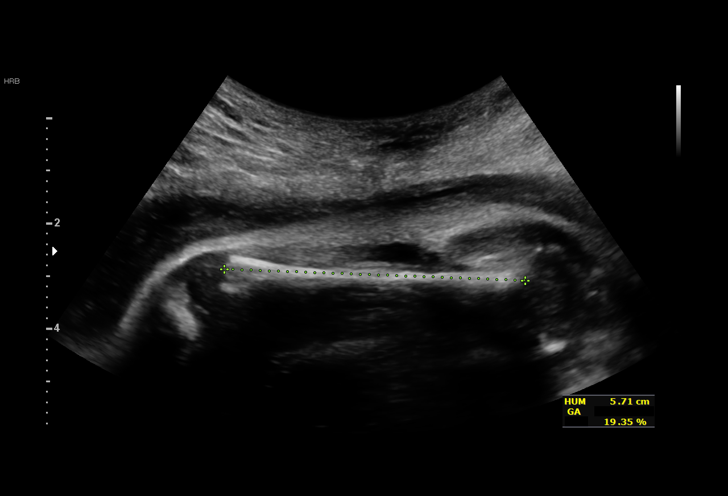
[im 23/33]
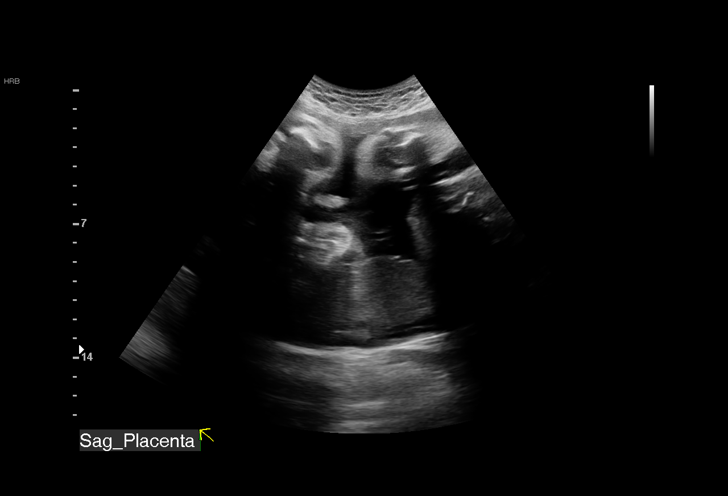
[im 25/33]
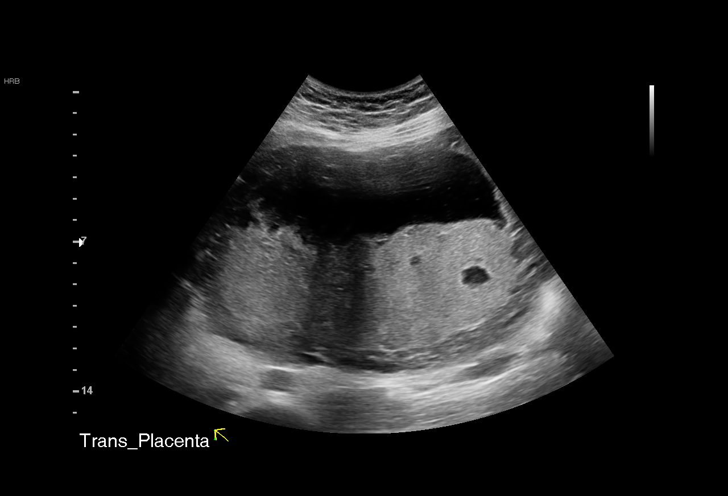
[im 28/33]
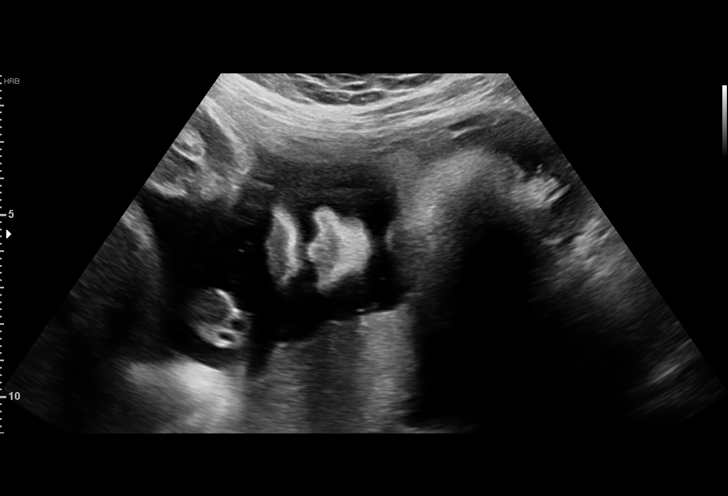
[im 30/33]
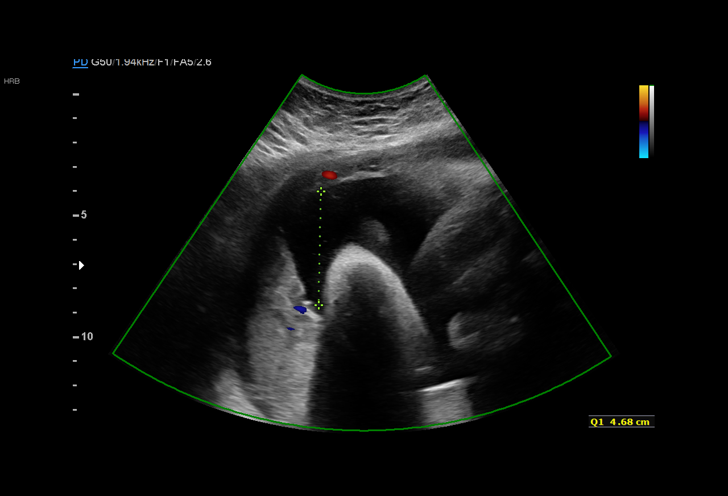
[im 33/33]
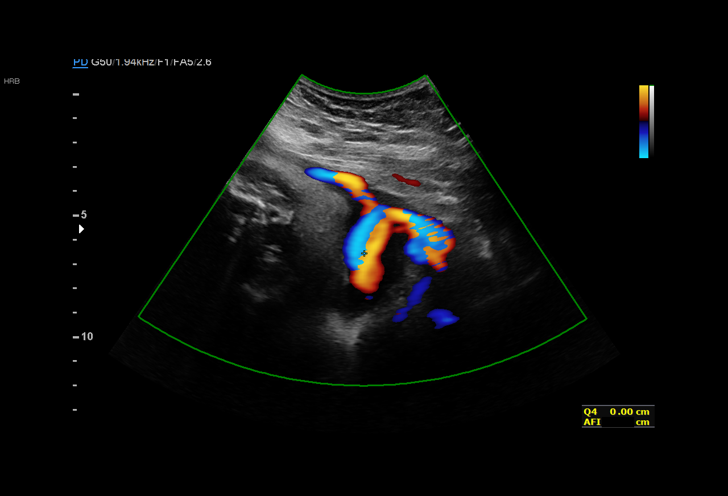

[14 of 28 positions shown; findings below may reference images not displayed]

[REDACTED]care - [HOSPITAL]

 ----------------------------------------------------------------------

 ----------------------------------------------------------------------
Indications

  Obesity complicating pregnancy, second
  trimester
  Encounter for antenatal screening for
  malformations( Low Risk NIPS)
  36 weeks gestation of pregnancy
 ----------------------------------------------------------------------
Vital Signs

 BMI:
Fetal Evaluation

 Num Of Fetuses:         1
 Fetal Heart Rate(bpm):  136
 Cardiac Activity:       Observed
 Presentation:           Cephalic
 Placenta:               Posterior

 Amniotic Fluid
 AFI FV:      Within normal limits

 AFI Sum(cm)     %Tile       Largest Pocket(cm)
 12.25           39

 RUQ(cm)       RLQ(cm)       LUQ(cm)        LLQ(cm)
 4.68          0
Biometry
 BPD:      90.7  mm     G. Age:  36w 5d         73  %    CI:        78.67   %    70 - 86
                                                         FL/HC:      21.2   %    20.1 -
 HC:      323.4  mm     G. Age:  36w 4d         27  %    HC/AC:      0.96        0.93 -
 AC:      338.4  mm     G. Age:  37w 5d         91  %    FL/BPD:     75.4   %    71 - 87
 FL:       68.4  mm     G. Age:  35w 1d         20  %    FL/AC:      20.2   %    20 - 24
 HUM:      57.1  mm     G. Age:  33w 1d         10  %
 LV:        7.5  mm

 Est. FW:    0585  gm    6 lb 12 oz      77  %
OB History

 Gravidity:    1
Gestational Age

 U/S Today:     36w 4d                                        EDD:   08/19/18
 Best:          36w 2d     Det. By:  Early Ultrasound         EDD:   08/21/18
                                     (01/13/18)
Anatomy

 Cranium:               Appears normal         Aortic Arch:            Not well visualized
 Cavum:                 Appears normal         Ductal Arch:            Previously seen
 Ventricles:            Appears normal         Diaphragm:              Previously seen
 Choroid Plexus:        Previously seen        Stomach:                Appears normal, left
                                                                       sided
 Cerebellum:            Previously seen        Abdomen:                Appears normal
 Posterior Fossa:       Previously seen        Abdominal Wall:         Previously seen
 Nuchal Fold:           Previously seen        Cord Vessels:           Previously seen
 Face:                  Orbits and profile     Kidneys:                Appear normal
                        previously seen
 Lips:                  Previously seen        Bladder:                Appears normal
 Thoracic:              Appears normal         Spine:                  Previously seen
 Heart:                 Previously seen        Upper Extremities:      Previously seen
 RVOT:                  Appears normal         Lower Extremities:      Previously seen
 LVOT:                  Previously seen

 Other:  Male gender. Heels visualized. Technically difficult due to fetal
         position.
Cervix Uterus Adnexa

 Cervix
 Not visualized (advanced GA >56wks)
Impression

 Live late preterm singleton gestation.
 Approrpiately grown fetus with normal amiotic fluid.
 Completion of cardiac anatomy survey was not possible due
 to fetal gestational age and position.
Recommendations

 Follow up as clinically indicated.
                Df, Chas

## 2020-07-01 ENCOUNTER — Telehealth (INDEPENDENT_AMBULATORY_CARE_PROVIDER_SITE_OTHER): Payer: Medicaid Other | Admitting: Obstetrics

## 2020-07-01 ENCOUNTER — Encounter: Payer: Self-pay | Admitting: Obstetrics

## 2020-07-01 VITALS — BP 105/55 | HR 98 | Wt 175.0 lb

## 2020-07-01 DIAGNOSIS — Z3480 Encounter for supervision of other normal pregnancy, unspecified trimester: Secondary | ICD-10-CM

## 2020-07-01 DIAGNOSIS — M549 Dorsalgia, unspecified: Secondary | ICD-10-CM

## 2020-07-01 DIAGNOSIS — O26892 Other specified pregnancy related conditions, second trimester: Secondary | ICD-10-CM

## 2020-07-01 DIAGNOSIS — Z3A23 23 weeks gestation of pregnancy: Secondary | ICD-10-CM

## 2020-07-01 MED ORDER — MISC. DEVICES MISC: 0 refills | Status: DC

## 2020-07-01 NOTE — Progress Notes (Signed)
I connected with Dana Flores on 07/01/20 at 10:15 AM EST by telephone and verified that I am speaking with the correct person using two identifiers.  Pt reports low back pain and requests maternity belt

## 2020-07-01 NOTE — Progress Notes (Addendum)
OBSTETRICS PRENATAL VIRTUAL VISIT ENCOUNTER NOTE  Provider location: Center for [facility] [place]   I connected with Anthonette Frankey Poot on 07/01/20 at 10:15 AM EST by MyChart Video Encounter at home and verified that I am speaking with the correct person using two identifiers.   I discussed the limitations, risks, security and privacy concerns of performing an evaluation and management service virtually and the availability of in person appointments. I also discussed with the patient that there may be a patient responsible charge related to this service. The patient expressed understanding and agreed to proceed.  Subjective:  Dana Flores is a 19 y.o. G3P1011 at [redacted]w[redacted]d being seen today for ongoing prenatal care.  She is currently monitored for the following issues for this low-risk pregnancy and has History of umbilical hernia repair; Obesity (BMI 30.0-34.9); Postpartum depression; Postpartum anxiety; and Supervision of other normal pregnancy, antepartum on their problem list.  Patient reports backache and heartburn.  Contractions: Not present. Vag. Bleeding: None.  Movement: Present. Denies any leaking of fluid.   The following portions of the patient's history were reviewed and updated as appropriate: allergies, current medications, past family history, past medical history, past social history, past surgical history and problem list.   Objective:   Vitals:   07/01/20 1031  BP: (!) 105/55  Pulse: 98  Weight: 175 lb (79.4 kg)    Fetal Status:     Movement: Present     General:  Alert, oriented and cooperative. Patient is in no acute distress.  Respiratory: Normal respiratory effort, no problems with respiration noted  Mental Status: Normal mood and affect. Normal behavior. Normal judgment and thought content.  Rest of physical exam deferred due to type of encounter  Imaging: [place] MFM OB DETAIL +14 WK  Result Date:  06/05/2020 ----------------------------------------------------------------------  OBSTETRICS REPORT                        (Signed Final 06/05/2020 09:45 pm) ---------------------------------------------------------------------- Patient Info  ID #:       [id number]                          D.O.B.:  [birth year] (19 yrs)  Name:       Dana Flores                  Visit Date: 06/05/2020 12:52 pm ---------------------------------------------------------------------- Performed By  Attending:        Lin Landsman      Ref. Address:      Center for                    MD                                                              [facility]                                                              Healthcare  Performed By:     Sandi Mealy  Location:          [facility]                                      [facility]                                                              [facility]  Referred By:      Warden Fillers MD ---------------------------------------------------------------------- Orders  #  Description                           Code        Ordered By  1  [place] MFM OB DETAIL +14 WK               76811.01    Nolene Bernheim ----------------------------------------------------------------------  #  Order #                     Accession #                Episode #  1  [id number]                   [id number]                 [id number] ---------------------------------------------------------------------- Indications  Obesity complicating pregnancy, second          O99.212  trimester (BMI 34)  Low Risk NIPS; NEG Horizon  Encounter for antenatal screening for           Z36.3  malformations  [redacted] weeks gestation of pregnancy                 Z3A.19 ---------------------------------------------------------------------- Fetal Evaluation  Num Of Fetuses:          1  Fetal Heart Rate(bpm):    161  Cardiac Activity:        Observed  Presentation:            Cephalic  Placenta:                Anterior  P. Cord Insertion:       Visualized  Amniotic Fluid  AFI FV:      Within normal limits                              Largest Pocket(cm)  4.08 ---------------------------------------------------------------------- Biometry  BPD:      44.5  mm     G. Age:  19w 3d         58  %    CI:        74.77   %    70 - 86                                                          FL/HC:       19.0  %    16.1 - 18.3  HC:      163.3  mm     G. Age:  19w 1d         33  %    HC/AC:       1.15       1.09 - 1.39  AC:      142.6  mm     G. Age:  19w 4d         57  %    FL/BPD:      69.9  %  FL:       31.1  mm     G. Age:  19w 5d         57  %    FL/AC:       21.8  %    20 - 24  HUM:      27.5  mm     G. Age:  18w 6d         38  %  CER:      20.7  mm     G. Age:  19w 6d         71  %  NFT:       4.9  mm  LV:        5.6  mm  CM:        5.5  mm  Est. FW:     300   gm    0 lb 11 oz     62  % ---------------------------------------------------------------------- Gestational Age  U/S Today:     19w 3d                                        EDD:   10/27/20  Best:          19w 2d     Det. By:  U/S C R L  (03/18/20)    EDD:   10/28/20 ---------------------------------------------------------------------- Anatomy  Cranium:               Appears normal         Aortic Arch:            Appears normal  Cavum:                 Appears normal         Ductal Arch:            Appears normal  Ventricles:            Appears normal         Diaphragm:  Appears normal  Choroid Plexus:        Appears normal         Stomach:                Appears normal, left                                                                        sided  Cerebellum:            Appears normal         Abdomen:                Appears normal  Posterior Fossa:       Appears normal         Abdominal Wall:         Appears nml (cord                                                                         insert, abd wall)  Nuchal Fold:           Appears normal         Cord Vessels:           Appears normal (3                                                                        vessel cord)  Face:                  Appears normal         Kidneys:                Appear normal                         (orbits and profile)  Lips:                  Appears normal         Bladder:                Appears normal  Thoracic:              Appears normal         Spine:                  Limited views  appear normal  Heart:                 Not well visualized    Upper Extremities:      Appears normal  RVOT:                  Not well visualized    Lower Extremities:      Appears normal  LVOT:                  Not well visualized  Other:  Nasal bone visualized. Technically difficult due to fetal position. ---------------------------------------------------------------------- Cervix Uterus Adnexa  Cervix  Length:           3.05  cm.  Normal appearance by transabdominal scan. Closed ---------------------------------------------------------------------- Impression  Single intrauterine pregnancy here for a detailed anatomy  due elevated BMI  Normal anatomy with measurements consistent with dates  There is good fetal movement and amniotic fluid volume  Suboptimal views of the fetal anatomy were obtained  secondary to fetal position.  Low risk NIPS observed. ---------------------------------------------------------------------- Recommendations  Follow up growth in 4 weeks to complete fetal anatomy. ----------------------------------------------------------------------               Lin Landsman, MD Electronically Signed Final Report   06/05/2020 09:45 pm ----------------------------------------------------------------------   Assessment and Plan:  Pregnancy: G3P1011 at [redacted]w[redacted]d 1. Supervision of other  normal pregnancy, antepartum  2. Backache symptom Rx: - Misc. Devices MISC; Dispense one maternity belt for patient  Dispense: 1 each; Refill: 0  Preterm labor symptoms and general obstetric precautions including but not limited to vaginal bleeding, contractions, leaking of fluid and fetal movement were reviewed in detail with the patient. I discussed the assessment and treatment plan with the patient. The patient was provided an opportunity to ask questions and all were answered. The patient agreed with the plan and demonstrated an understanding of the instructions. The patient was advised to call back or seek an in-person office evaluation/go to [facility] at [facility] for any urgent or concerning symptoms. Please refer to After Visit Summary for other counseling recommendations.   I provided 15 minutes of face-to-face time during this encounter.  Return in about 4 weeks (around 07/29/2020) for ROB, 2 hour OGTT.  Future Appointments  Date Time Provider Department Center  07/06/2020  7:15 AM [facility] NURSE [facility]  07/06/2020  7:30 AM [facility] US2 [facility]  07/29/2020  8:00 AM [facility] LAB [facility] None  07/29/2020  8:55 AM Burleson, Brand Males, NP [facility] None    Coral Ceo, MD Center for [facility], [facility] 07/01/20

## 2020-07-03 ENCOUNTER — Ambulatory Visit: Payer: Medicaid Other

## 2020-07-06 ENCOUNTER — Other Ambulatory Visit: Payer: Self-pay | Admitting: *Deleted

## 2020-07-06 ENCOUNTER — Other Ambulatory Visit: Payer: Self-pay

## 2020-07-06 ENCOUNTER — Ambulatory Visit (HOSPITAL_BASED_OUTPATIENT_CLINIC_OR_DEPARTMENT_OTHER): Payer: Medicaid Other

## 2020-07-06 ENCOUNTER — Encounter: Payer: Self-pay | Admitting: *Deleted

## 2020-07-06 ENCOUNTER — Ambulatory Visit: Payer: Medicaid Other | Attending: Obstetrics | Admitting: *Deleted

## 2020-07-06 DIAGNOSIS — Z3A23 23 weeks gestation of pregnancy: Secondary | ICD-10-CM

## 2020-07-06 DIAGNOSIS — O99212 Obesity complicating pregnancy, second trimester: Secondary | ICD-10-CM | POA: Insufficient documentation

## 2020-07-06 DIAGNOSIS — E669 Obesity, unspecified: Secondary | ICD-10-CM | POA: Diagnosis not present

## 2020-07-06 DIAGNOSIS — O322XX0 Maternal care for transverse and oblique lie, not applicable or unspecified: Secondary | ICD-10-CM

## 2020-07-06 DIAGNOSIS — Z363 Encounter for antenatal screening for malformations: Secondary | ICD-10-CM | POA: Diagnosis not present

## 2020-07-06 DIAGNOSIS — O99342 Other mental disorders complicating pregnancy, second trimester: Secondary | ICD-10-CM | POA: Insufficient documentation

## 2020-07-06 DIAGNOSIS — Z362 Encounter for other antenatal screening follow-up: Secondary | ICD-10-CM

## 2020-07-06 DIAGNOSIS — Z3480 Encounter for supervision of other normal pregnancy, unspecified trimester: Secondary | ICD-10-CM

## 2020-07-06 DIAGNOSIS — F32A Depression, unspecified: Secondary | ICD-10-CM | POA: Insufficient documentation

## 2020-07-06 DIAGNOSIS — O2692 Pregnancy related conditions, unspecified, second trimester: Secondary | ICD-10-CM

## 2020-07-06 DIAGNOSIS — Z3689 Encounter for other specified antenatal screening: Secondary | ICD-10-CM

## 2020-07-29 ENCOUNTER — Other Ambulatory Visit: Payer: Self-pay

## 2020-07-29 ENCOUNTER — Other Ambulatory Visit: Payer: Medicaid Other

## 2020-07-29 ENCOUNTER — Encounter: Payer: Self-pay | Admitting: Nurse Practitioner

## 2020-07-29 ENCOUNTER — Ambulatory Visit (INDEPENDENT_AMBULATORY_CARE_PROVIDER_SITE_OTHER): Payer: Medicaid Other | Admitting: Nurse Practitioner

## 2020-07-29 VITALS — BP 111/67 | HR 95 | Wt 184.0 lb

## 2020-07-29 DIAGNOSIS — Z3A27 27 weeks gestation of pregnancy: Secondary | ICD-10-CM

## 2020-07-29 DIAGNOSIS — Z3480 Encounter for supervision of other normal pregnancy, unspecified trimester: Secondary | ICD-10-CM

## 2020-07-29 DIAGNOSIS — Z23 Encounter for immunization: Secondary | ICD-10-CM | POA: Diagnosis not present

## 2020-07-29 DIAGNOSIS — O26843 Uterine size-date discrepancy, third trimester: Secondary | ICD-10-CM

## 2020-07-29 DIAGNOSIS — M549 Dorsalgia, unspecified: Secondary | ICD-10-CM

## 2020-07-29 DIAGNOSIS — O2243 Hemorrhoids in pregnancy, third trimester: Secondary | ICD-10-CM

## 2020-07-29 MED ORDER — HYDROCORTISONE (PERIANAL) 2.5 % EX CREA: 1.0000 "application " | TOPICAL_CREAM | Freq: Two times a day (BID) | CUTANEOUS | 0 refills | Status: DC

## 2020-07-29 NOTE — Patient Instructions (Signed)
Sign up for breastfeeding classes at [email or web address]

## 2020-07-29 NOTE — Progress Notes (Signed)
    Subjective:  Dana Flores is a 19 y.o. G3P1011 at [redacted]w[redacted]d being seen today for ongoing prenatal care.  She is currently monitored for the following issues for this low-risk pregnancy and has History of umbilical hernia repair; Obesity (BMI 30.0-34.9); Postpartum depression; Postpartum anxiety; and Supervision of other normal pregnancy, antepartum on their problem list.  Patient reports backache.  Contractions: Irritability.  .  Movement: Present. Denies leaking of fluid.   The following portions of the patient's history were reviewed and updated as appropriate: allergies, current medications, past family history, past medical history, past social history, past surgical history and problem list. Problem list updated.  Objective:   Vitals:   07/29/20 0843  BP: 111/67  Pulse: 95  Weight: 184 lb (83.5 kg)    Fetal Status: Fetal Heart Rate (bpm): 148 Fundal Height: 31 cm Movement: Present     General:  Alert, oriented and cooperative. Patient is in no acute distress.  Skin: Skin is warm and dry. No rash noted.   Cardiovascular: Normal heart rate noted  Respiratory: Normal respiratory effort, no problems with respiration noted  Abdomen: Soft, gravid, appropriate for gestational age. Pain/Pressure: Present     Pelvic:  Cervical exam deferred        Extremities: Normal range of motion.  Edema: Trace  Mental Status: Normal mood and affect. Normal behavior. Normal judgment and thought content.   Urinalysis:      Assessment and Plan:  Pregnancy: G3P1011 at [redacted]w[redacted]d  1. Supervision of other normal pregnancy, antepartum Declines Covid vaccine for now - encouraged to consider in pregnancy and reviewed safety and development of vaccine.  Advised to get at [facility] or [facility]  - CBC - Glucose Tolerance, 2 Hours w/1 Hour - HIV Antibody (routine testing w rflx) - RPR - Tdap vaccine greater than or equal to 7yo IM - Flu Vaccine QUAD 36+ mos IM (Fluarix, Quad PF)  2. Backache symptom Is  planning to pick up pregnancy support belt and will see if that is helpful for back pain  3. Hemorrhoids during pregnancy in third trimester Prescribed cream for hemorrhoids Discussed increasing dietary fiber Drink at least 8 8-oz glasses of water every day.   4. Uterine size date discrepancy pregnancy, third trimester Watch fundal height and consider ultrasound if continues to be elevated.   Preterm labor symptoms and general obstetric precautions including but not limited to vaginal bleeding, contractions, leaking of fluid and fetal movement were reviewed in detail with the patient. Please refer to After Visit Summary for other counseling recommendations.  Return in about 2 weeks (around 08/12/2020) for in person ROB.  Nolene Bernheim, RN, MSN, NP-BC Nurse Practitioner, [facility] for [employer], [facility] 07/30/2020 7:25 AM

## 2020-07-29 NOTE — Progress Notes (Signed)
ROB  CC: Nose bleed 2 days ago.pt notes fatigue, feeling faint.   T-Dap : Will give today.  Flu vaccine: will give today

## 2020-07-30 DIAGNOSIS — O26843 Uterine size-date discrepancy, third trimester: Secondary | ICD-10-CM | POA: Insufficient documentation

## 2020-07-30 DIAGNOSIS — O2243 Hemorrhoids in pregnancy, third trimester: Secondary | ICD-10-CM | POA: Insufficient documentation

## 2020-07-30 LAB — CBC
Hematocrit: 26.3 % — ABNORMAL LOW (ref 34.0–46.6)
Hemoglobin: 8.7 g/dL — ABNORMAL LOW (ref 11.1–15.9)
MCH: 25.1 pg — ABNORMAL LOW (ref 26.6–33.0)
MCHC: 33.1 g/dL (ref 31.5–35.7)
MCV: 76 fL — ABNORMAL LOW (ref 79–97)
Platelets: 225 10*3/uL (ref 150–450)
RBC: 3.47 x10E6/uL — ABNORMAL LOW (ref 3.77–5.28)
RDW: 14.1 % (ref 11.7–15.4)
WBC: 9.1 10*3/uL (ref 3.4–10.8)

## 2020-07-30 LAB — GLUCOSE TOLERANCE, 2 HOURS W/ 1HR
Glucose, 1 hour: 87 mg/dL (ref 65–179)
Glucose, 2 hour: 83 mg/dL (ref 65–152)
Glucose, Fasting: 71 mg/dL (ref 65–91)

## 2020-07-30 LAB — RPR: RPR Ser Ql: NONREACTIVE

## 2020-07-30 LAB — HIV ANTIBODY (ROUTINE TESTING W REFLEX): HIV Screen 4th Generation wRfx: NONREACTIVE

## 2020-07-31 ENCOUNTER — Encounter: Payer: Self-pay | Admitting: Nurse Practitioner

## 2020-07-31 DIAGNOSIS — O99013 Anemia complicating pregnancy, third trimester: Secondary | ICD-10-CM | POA: Insufficient documentation

## 2020-07-31 HISTORY — DX: Anemia complicating pregnancy, third trimester: O99.013

## 2020-08-03 ENCOUNTER — Other Ambulatory Visit: Payer: Self-pay | Admitting: *Deleted

## 2020-08-03 ENCOUNTER — Other Ambulatory Visit: Payer: Self-pay

## 2020-08-03 ENCOUNTER — Ambulatory Visit: Payer: Medicaid Other | Attending: Obstetrics and Gynecology

## 2020-08-03 DIAGNOSIS — O99012 Anemia complicating pregnancy, second trimester: Secondary | ICD-10-CM

## 2020-08-03 DIAGNOSIS — Z362 Encounter for other antenatal screening follow-up: Secondary | ICD-10-CM

## 2020-08-03 DIAGNOSIS — E669 Obesity, unspecified: Secondary | ICD-10-CM

## 2020-08-03 DIAGNOSIS — Z3689 Encounter for other specified antenatal screening: Secondary | ICD-10-CM

## 2020-08-03 DIAGNOSIS — F32A Depression, unspecified: Secondary | ICD-10-CM

## 2020-08-03 DIAGNOSIS — O99342 Other mental disorders complicating pregnancy, second trimester: Secondary | ICD-10-CM | POA: Diagnosis not present

## 2020-08-03 DIAGNOSIS — F209 Schizophrenia, unspecified: Secondary | ICD-10-CM

## 2020-08-03 DIAGNOSIS — O99212 Obesity complicating pregnancy, second trimester: Secondary | ICD-10-CM | POA: Diagnosis not present

## 2020-08-03 DIAGNOSIS — Z3A27 27 weeks gestation of pregnancy: Secondary | ICD-10-CM

## 2020-08-03 DIAGNOSIS — R638 Other symptoms and signs concerning food and fluid intake: Secondary | ICD-10-CM

## 2020-08-03 DIAGNOSIS — D649 Anemia, unspecified: Secondary | ICD-10-CM

## 2020-08-13 ENCOUNTER — Encounter: Payer: Medicaid Other | Admitting: Nurse Practitioner

## 2020-08-15 NOTE — L&D Delivery Note (Addendum)
OB/GYN Faculty Practice Delivery Note  Dana Flores is a 20 y.o. G3P1011 s/p NSVD at [redacted]w[redacted]d. She was admitted for SOL.   ROM: 1h 57m with Mild meconium fluid GBS Status:  Negative/-- (02/21 0229) Maximum Maternal Temperature:  Temp (24hrs), Avg:98.2 F (36.8 C), Min:98.2 F (36.8 C), Max:98.2 F (36.8 C)  Labor Progress: . Patient arrived at 1.5 cm dilation and was induced with AROM at 7 cm.   Delivery Date/Time: 10/23/2020 at 1623 Delivery: Called to room and patient was complete and pushing. Head delivered in LOA position. Nuchal cord present and reduced after delivery. Shoulder and body delivered in usual fashion. Infant with spontaneous cry, placed on mother's abdomen, dried and stimulated. Cord clamped x 2 after 2-minute delay, and cut by FOB. Cord blood drawn. Placenta delivered spontaneously with gentle cord traction. Fundus firm with massage and Pitocin. Labia, perineum, vagina, and cervix inspected with 1st degree perineal laceration which was hemostatic.   Placenta: Spontaneous, complete, 3 vessel cord  Complications: None  Lacerations: 1st degree perineal; hemostatic not repaired  EBL: 100 mL Analgesia: Epidural    Infant: APGAR (1 MIN):  Pending  APGAR (5 MINS):  Pending  APGAR (10 MINS):  Pending   Weight: Pending   Derrel Nip, MD  PGY-2, [facility]  10/23/2020 4:49 PM   I was present and scrubbed for entirety of delivery. I agree with the findings and the plan of care as documented in the resident's note.  Casper Harrison, MD [facility] Fellow, [facility] for [facility], [facility]

## 2020-08-18 ENCOUNTER — Ambulatory Visit (INDEPENDENT_AMBULATORY_CARE_PROVIDER_SITE_OTHER): Payer: Medicaid Other

## 2020-08-18 ENCOUNTER — Other Ambulatory Visit: Payer: Self-pay

## 2020-08-18 VITALS — BP 106/69 | HR 87 | Wt 183.0 lb

## 2020-08-18 DIAGNOSIS — Z3A29 29 weeks gestation of pregnancy: Secondary | ICD-10-CM

## 2020-08-18 DIAGNOSIS — O99013 Anemia complicating pregnancy, third trimester: Secondary | ICD-10-CM

## 2020-08-18 DIAGNOSIS — Z3480 Encounter for supervision of other normal pregnancy, unspecified trimester: Secondary | ICD-10-CM

## 2020-08-18 MED ORDER — FERROUS SULFATE 325 (65 FE) MG PO TBEC: 325.0000 mg | DELAYED_RELEASE_TABLET | ORAL | 3 refills | Status: DC

## 2020-08-18 NOTE — Progress Notes (Signed)
   LOW-RISK PREGNANCY OFFICE VISIT  Patient name: Dana Flores MRN [id number]  Date of birth: 2001-02-22 Chief Complaint:   Routine Prenatal Visit  Subjective:   [name] is a 20 y.o. G34P1011 female at [redacted]w[redacted]d with an Estimated Date of Delivery: 10/28/20 being seen today for ongoing management of a low-risk pregnancy aeb has History of umbilical hernia repair; Obesity (BMI 30.0-34.9); Postpartum depression; Postpartum anxiety; Supervision of other normal pregnancy, antepartum; Uterine size date discrepancy pregnancy, third trimester; Hemorrhoids during pregnancy in third trimester; and Anemia in pregnancy, third trimester on their problem list.  Patient presents today with complaint of leg cramping and fatigue. Patient endorses fetal movement.  She denies vaginal concerns including abnormal discharge, leaking of fluid, and bleeding. Patient states that she frequently craves ice. Contractions: Not present. Vag. Bleeding: None.  Movement: Present.  Reviewed past medical,surgical, social, obstetrical and family history as well as problem list, medications and allergies.  Objective   Vitals:   08/18/20 1413  BP: 106/69  Pulse: 87  Weight: 183 lb (83 kg)  Body mass index is 36.96 kg/m.  Total Weight Gain:8 lb (3.629 kg)         Physical Examination:   General appearance: Well appearing, and in no distress  Mental status: Alert, oriented to person, place, and time  Skin: Warm & dry  Cardiovascular: Normal heart rate noted  Respiratory: Normal respiratory effort, no distress  Abdomen: Soft, gravid, nontender, AGA with Fundal height of Fundal Height: 31 cm  Pelvic: Cervical exam deferred           Extremities: Edema: None  Fetal Status: Fetal Heart Rate (bpm): 152  Movement: Present   No results found for this or any previous visit (from the past 24 hour(s)).  Assessment & Plan:  LOW-risk pregnancy of a 20 y.o., G3P1011 at [redacted]w[redacted]d with an Estimated Date of Delivery: 10/28/20   1.  Supervision of other normal pregnancy, antepartum -Anticipatory guidance for upcoming appts.  2. [redacted] weeks gestation of pregnancy -Reviewed pregnancy complaints.  3. Anemia in pregnancy, third trimester -Reviewed anemia status. -Scheduled for first IV Iron Infusion on 1/10. -Discussed iron rich diet. -Discussed ice craving. -Informed that c/o fatigue and leg pain is likely related to low iron levels. -Will send iron script.  Instructed to take QOD.       Meds:  Meds ordered this encounter  Medications  . ferrous sulfate 325 (65 FE) MG EC tablet    Sig: Take 1 tablet (325 mg total) by mouth every other day.    Dispense:  45 tablet    Refill:  3    Order Specific Question:   Supervising Provider    Answer:   Reva Bores [[id number]]   Labs/procedures today:  Lab Orders  No laboratory test(s) ordered today     Reviewed: Preterm labor symptoms and general obstetric precautions including but not limited to vaginal bleeding, contractions, leaking of fluid and fetal movement were reviewed in detail with the patient.  All questions were answered.  Follow-up: Return in about 3 weeks (around 09/08/2020) for LROB.  No orders of the defined types were placed in this encounter.  Cherre Robins MSN, CNM 08/18/2020\

## 2020-08-18 NOTE — Patient Instructions (Signed)

## 2020-08-18 NOTE — Progress Notes (Signed)
ROB 29 wks  CC: Back Pain and Leg Spasms.

## 2020-08-24 ENCOUNTER — Encounter (HOSPITAL_COMMUNITY)
Admission: RE | Admit: 2020-08-24 | Discharge: 2020-08-24 | Disposition: A | Discharge status: Home / Self Care | Payer: Medicaid Other | Source: Ambulatory Visit | Attending: Obstetrics and Gynecology | Admitting: Obstetrics and Gynecology

## 2020-08-24 ENCOUNTER — Other Ambulatory Visit: Payer: Self-pay | Admitting: Obstetrics and Gynecology

## 2020-08-24 ENCOUNTER — Other Ambulatory Visit: Payer: Self-pay

## 2020-08-24 ENCOUNTER — Other Ambulatory Visit (HOSPITAL_COMMUNITY): Payer: Self-pay | Admitting: *Deleted

## 2020-08-24 DIAGNOSIS — O99013 Anemia complicating pregnancy, third trimester: Secondary | ICD-10-CM | POA: Insufficient documentation

## 2020-08-24 DIAGNOSIS — Z3A29 29 weeks gestation of pregnancy: Secondary | ICD-10-CM | POA: Insufficient documentation

## 2020-08-24 MED ORDER — SODIUM CHLORIDE 0.9 % IV SOLN: 510.0000 mg | INTRAVENOUS | Status: AC

## 2020-08-24 MED ORDER — SODIUM CHLORIDE 0.9 % IV SOLN
510.0000 mg | INTRAVENOUS | Status: DC
  Administered 2020-08-24: 510 mg via INTRAVENOUS
  Filled 2020-08-24: qty 17

## 2020-08-24 NOTE — Discharge Instructions (Signed)
Ferumoxytol injection What is this medicine? FERUMOXYTOL is an iron complex. Iron is used to make healthy red blood cells, which carry oxygen and nutrients throughout the body. This medicine is used to treat iron deficiency anemia. This medicine may be used for other purposes; ask your health care provider or pharmacist if you have questions. COMMON BRAND NAME(S): Feraheme What should I tell my health care provider before I take this medicine? They need to know if you have any of these conditions:  anemia not caused by low iron levels  high levels of iron in the blood  magnetic resonance imaging (MRI) test scheduled  an unusual or allergic reaction to iron, other medicines, foods, dyes, or preservatives  pregnant or trying to get pregnant  breast-feeding How should I use this medicine? This medicine is for injection into a vein. It is given by a health care professional in a hospital or clinic setting. Talk to your pediatrician regarding the use of this medicine in children. Special care may be needed. Overdosage: If you think you have taken too much of this medicine contact a poison control center or emergency room at once. NOTE: This medicine is only for you. Do not share this medicine with others. What if I miss a dose? It is important not to miss your dose. Call your doctor or health care professional if you are unable to keep an appointment. What may interact with this medicine? This medicine may interact with the following medications:  other iron products This list may not describe all possible interactions. Give your health care provider a list of all the medicines, herbs, non-prescription drugs, or dietary supplements you use. Also tell them if you smoke, drink alcohol, or use illegal drugs. Some items may interact with your medicine. What should I watch for while using this medicine? Visit your doctor or healthcare professional regularly. Tell your doctor or healthcare  professional if your symptoms do not start to get better or if they get worse. You may need blood work done while you are taking this medicine. You may need to follow a special diet. Talk to your doctor. Foods that contain iron include: whole grains/cereals, dried fruits, beans, or peas, leafy green vegetables, and organ meats (liver, kidney). What side effects may I notice from receiving this medicine? Side effects that you should report to your doctor or health care professional as soon as possible:  allergic reactions like skin rash, itching or hives, swelling of the face, lips, or tongue  breathing problems  changes in blood pressure  feeling faint or lightheaded, falls  fever or chills  flushing, sweating, or hot feelings  swelling of the ankles or feet Side effects that usually do not require medical attention (report to your doctor or health care professional if they continue or are bothersome):  diarrhea  headache  nausea, vomiting  stomach pain This list may not describe all possible side effects. Call your doctor for medical advice about side effects. You may report side effects to FDA at 1-800-FDA-1088. Where should I keep my medicine? This drug is given in a hospital or clinic and will not be stored at home. NOTE: This sheet is a summary. It may not cover all possible information. If you have questions about this medicine, talk to your doctor, pharmacist, or health care provider.  2021 Elsevier/Gold Standard (2016-09-19 20:21:10)  

## 2020-08-31 ENCOUNTER — Encounter (HOSPITAL_COMMUNITY)
Admission: RE | Admit: 2020-08-31 | Discharge: 2020-08-31 | Disposition: A | Discharge status: Home / Self Care | Payer: Medicaid Other | Source: Ambulatory Visit | Attending: Obstetrics and Gynecology | Admitting: Obstetrics and Gynecology

## 2020-08-31 ENCOUNTER — Other Ambulatory Visit: Payer: Self-pay

## 2020-08-31 DIAGNOSIS — O99013 Anemia complicating pregnancy, third trimester: Secondary | ICD-10-CM | POA: Diagnosis not present

## 2020-08-31 MED ORDER — SODIUM CHLORIDE 0.9 % IV SOLN
510.0000 mg | INTRAVENOUS | Status: DC
  Administered 2020-08-31: 510 mg via INTRAVENOUS
  Filled 2020-08-31: qty 17

## 2020-09-07 ENCOUNTER — Ambulatory Visit (INDEPENDENT_AMBULATORY_CARE_PROVIDER_SITE_OTHER): Payer: Medicaid Other | Admitting: Advanced Practice Midwife

## 2020-09-07 ENCOUNTER — Ambulatory Visit: Payer: Medicaid Other | Admitting: *Deleted

## 2020-09-07 ENCOUNTER — Encounter: Payer: Self-pay | Admitting: *Deleted

## 2020-09-07 ENCOUNTER — Other Ambulatory Visit: Payer: Self-pay

## 2020-09-07 ENCOUNTER — Encounter: Payer: Self-pay | Admitting: Advanced Practice Midwife

## 2020-09-07 ENCOUNTER — Ambulatory Visit: Payer: Medicaid Other | Attending: Obstetrics and Gynecology

## 2020-09-07 VITALS — BP 110/67 | HR 107 | Wt 188.0 lb

## 2020-09-07 DIAGNOSIS — R638 Other symptoms and signs concerning food and fluid intake: Secondary | ICD-10-CM | POA: Insufficient documentation

## 2020-09-07 DIAGNOSIS — O99343 Other mental disorders complicating pregnancy, third trimester: Secondary | ICD-10-CM | POA: Diagnosis not present

## 2020-09-07 DIAGNOSIS — O99213 Obesity complicating pregnancy, third trimester: Secondary | ICD-10-CM

## 2020-09-07 DIAGNOSIS — Z3480 Encounter for supervision of other normal pregnancy, unspecified trimester: Secondary | ICD-10-CM

## 2020-09-07 DIAGNOSIS — Z3A32 32 weeks gestation of pregnancy: Secondary | ICD-10-CM

## 2020-09-07 DIAGNOSIS — D649 Anemia, unspecified: Secondary | ICD-10-CM | POA: Diagnosis not present

## 2020-09-07 DIAGNOSIS — F209 Schizophrenia, unspecified: Secondary | ICD-10-CM

## 2020-09-07 DIAGNOSIS — O99013 Anemia complicating pregnancy, third trimester: Secondary | ICD-10-CM

## 2020-09-07 DIAGNOSIS — F32A Depression, unspecified: Secondary | ICD-10-CM

## 2020-09-07 DIAGNOSIS — R040 Epistaxis: Secondary | ICD-10-CM

## 2020-09-07 DIAGNOSIS — E669 Obesity, unspecified: Secondary | ICD-10-CM

## 2020-09-07 DIAGNOSIS — Z362 Encounter for other antenatal screening follow-up: Secondary | ICD-10-CM

## 2020-09-07 NOTE — Progress Notes (Signed)
   PRENATAL VISIT NOTE  Subjective:  Dana Flores is a 20 y.o. G3P1011 at [redacted]w[redacted]d being seen today for ongoing prenatal care.  She is currently monitored for the following issues for this low-risk pregnancy and has History of umbilical hernia repair; Obesity (BMI 30.0-34.9); Postpartum depression; Postpartum anxiety; Supervision of other normal pregnancy, antepartum; Uterine size date discrepancy pregnancy, third trimester; Hemorrhoids during pregnancy in third trimester; and Anemia in pregnancy, third trimester on their problem list.  Patient reports nosebleeds.  Contractions: Irritability. Vag. Bleeding: None.  Movement: Present. Denies leaking of fluid.   The following portions of the patient's history were reviewed and updated as appropriate: allergies, current medications, past family history, past medical history, past social history, past surgical history and problem list.   Objective:   Vitals:   09/07/20 1023  BP: 110/67  Pulse: (!) 107  Weight: 188 lb (85.3 kg)    Fetal Status:     Movement: Present     General:  Alert, oriented and cooperative. Patient is in no acute distress.  Skin: Skin is warm and dry. No rash noted.   Cardiovascular: Normal heart rate noted  Respiratory: Normal respiratory effort, no problems with respiration noted  Abdomen: Soft, gravid, appropriate for gestational age.  Pain/Pressure: Present     Pelvic: Cervical exam deferred        Extremities: Normal range of motion.  Edema: None  Mental Status: Normal mood and affect. Normal behavior. Normal judgment and thought content.   Assessment and Plan:  Pregnancy: G3P1011 at [redacted]w[redacted]d 1. Supervision of other normal pregnancy, antepartum --Anticipatory guidance about next visits/weeks of pregnancy given. --Next visit in 2 weeks in office  2. Anemia in pregnancy, third trimester --Has completed IV iron x 2 at infusion center --No longer craving ice chips --Consider CBC at next visit  3. [redacted] weeks  gestation of pregnancy   4. Nosebleed, symptom --Recent nosebleeds x 2.  Resolved easily. --Discussed vascular changes in pregnancy as likely cause --Use saline nasal spray --Let us know if occur often or do not resolve easily  Preterm labor symptoms and general obstetric precautions including but not limited to vaginal bleeding, contractions, leaking of fluid and fetal movement were reviewed in detail with the patient. Please refer to After Visit Summary for other counseling recommendations.   Return in about 2 weeks (around 09/21/2020).  Future Appointments  Date Time Provider Department Center  09/21/2020  2:20 PM Leftwich-Kirby, Wilmer Floor, CNM [facility] None    Sharen Counter, CNM

## 2020-09-07 NOTE — Progress Notes (Signed)
ROB   CC: Nose bleed the other night and low B/P pt concerned wants to discuss Pt also note pressure with standing.   Pt notes recently getting iron infusion.

## 2020-09-07 NOTE — Patient Instructions (Signed)

## 2020-09-21 ENCOUNTER — Ambulatory Visit (INDEPENDENT_AMBULATORY_CARE_PROVIDER_SITE_OTHER): Payer: Medicaid Other | Admitting: Advanced Practice Midwife

## 2020-09-21 ENCOUNTER — Other Ambulatory Visit: Payer: Self-pay

## 2020-09-21 VITALS — BP 115/66 | HR 96 | Wt 186.0 lb

## 2020-09-21 DIAGNOSIS — Z3480 Encounter for supervision of other normal pregnancy, unspecified trimester: Secondary | ICD-10-CM

## 2020-09-21 DIAGNOSIS — Z3A34 34 weeks gestation of pregnancy: Secondary | ICD-10-CM

## 2020-09-21 DIAGNOSIS — O479 False labor, unspecified: Secondary | ICD-10-CM

## 2020-09-21 DIAGNOSIS — O99013 Anemia complicating pregnancy, third trimester: Secondary | ICD-10-CM

## 2020-09-21 NOTE — Progress Notes (Signed)
+   Fetal movement. Pt c/o increased contractions. States she has noticed them increase over the last week. Denies any vaginal bleeding or watery discharge. Pt request cervical check.

## 2020-09-21 NOTE — Progress Notes (Signed)
   PRENATAL VISIT NOTE  Subjective:  Dana Flores is a 20 y.o. G3P1011 at [redacted]w[redacted]d being seen today for ongoing prenatal care.  She is currently monitored for the following issues for this low-risk pregnancy and has History of umbilical hernia repair; Obesity (BMI 30.0-34.9); Postpartum depression; Postpartum anxiety; Supervision of other normal pregnancy, antepartum; Uterine size date discrepancy pregnancy, third trimester; Hemorrhoids during pregnancy in third trimester; and Anemia in pregnancy, third trimester on their problem list.  Patient reports occasional contractions.  Contractions: Irregular. Vag. Bleeding: None.  Movement: Present. Denies leaking of fluid.   The following portions of the patient's history were reviewed and updated as appropriate: allergies, current medications, past family history, past medical history, past social history, past surgical history and problem list.   Objective:   Vitals:   09/21/20 1424  BP: 115/66  Pulse: 96  Weight: 186 lb (84.4 kg)    Fetal Status: Fetal Heart Rate (bpm): 156 Fundal Height: 35 cm Movement: Present     General:  Alert, oriented and cooperative. Patient is in no acute distress.  Skin: Skin is warm and dry. No rash noted.   Cardiovascular: Normal heart rate noted  Respiratory: Normal respiratory effort, no problems with respiration noted  Abdomen: Soft, gravid, appropriate for gestational age.  Pain/Pressure: Present     Pelvic: Cervical exam performed in the presence of a chaperone Dilation: Closed Effacement (%): Thick Station: Ballotable  Extremities: Normal range of motion.  Edema: Trace  Mental Status: Normal mood and affect. Normal behavior. Normal judgment and thought content.   Assessment and Plan:  Pregnancy: G3P1011 at [redacted]w[redacted]d 1. Supervision of other normal pregnancy, antepartum --Anticipatory guidance about next visits/weeks of pregnancy given. --Next visit in 2 weeks in office for GBS  2. Anemia in pregnancy,  third trimester --Taking oral iron, no s/sx  3. [redacted] weeks gestation of pregnancy   4. Braxton Hicks contractions --Irregular cramping, more at night.   --Increase PO fluids --Rest/ice/heat/warm bath/Tylenol/pregnancy support belt --s/sx of PTL reviewed   Preterm labor symptoms and general obstetric precautions including but not limited to vaginal bleeding, contractions, leaking of fluid and fetal movement were reviewed in detail with the patient. Please refer to After Visit Summary for other counseling recommendations.   Return in about 2 weeks (around 10/05/2020).  Future Appointments  Date Time Provider Department Center  10/05/2020  1:40 PM Leftwich-Kirby, Wilmer Floor, CNM [facility] None    Sharen Counter, CNM

## 2020-09-21 NOTE — Patient Instructions (Signed)
Things to Try After 37 weeks to Encourage Labor/Get Ready for Labor:   1.  Try the Miles Circuit at www.milescircuit.com daily to improve baby's position and encourage the onset of labor.  2. Walk a little and rest a little every day.  Change positions often.  3. Cervical Ripening: May try one or both a. Red Raspberry Leaf capsules or tea:  two 300mg or 400mg tablets with each meal, 2-3 times a day, or 1-3 cups of tea daily  Potential Side Effects Of Raspberry Leaf:  Most women do not experience any side effects from drinking raspberry leaf tea. However, nausea and loose stools are possible   b. Evening Primrose Oil capsules: take 1 capsule by mouth and place one capsule in the vagina every night.    Some of the potential side effects:  Upset stomach  Loose stools or diarrhea  Headaches  Nausea  4. Sex can also help the cervix ripen and encourage labor onset.    Labor Precautions Reasons to come to MAU at [facility]:  1.  Contractions are  5 minutes apart or less, each last 1 minute, these have been going on for 1-2 hours, and you cannot walk or talk during them 2.  You have a large gush of fluid, or a trickle of fluid that will not stop and you have to wear a pad 3.  You have bleeding that is bright red, heavier than spotting--like menstrual bleeding (spotting can be normal in early labor or after a check of your cervix) 4.  You do not feel the baby moving like he/she normally does 

## 2020-10-05 ENCOUNTER — Ambulatory Visit (INDEPENDENT_AMBULATORY_CARE_PROVIDER_SITE_OTHER): Payer: Medicaid Other | Admitting: Advanced Practice Midwife

## 2020-10-05 ENCOUNTER — Other Ambulatory Visit (HOSPITAL_COMMUNITY)
Admission: RE | Admit: 2020-10-05 | Discharge: 2020-10-05 | Disposition: A | Discharge status: Home / Self Care | Payer: Medicaid Other | Source: Ambulatory Visit | Attending: Advanced Practice Midwife | Admitting: Advanced Practice Midwife

## 2020-10-05 ENCOUNTER — Other Ambulatory Visit: Payer: Self-pay

## 2020-10-05 VITALS — BP 110/64 | HR 91 | Wt 185.0 lb

## 2020-10-05 DIAGNOSIS — Z3483 Encounter for supervision of other normal pregnancy, third trimester: Secondary | ICD-10-CM | POA: Diagnosis present

## 2020-10-05 DIAGNOSIS — Z3480 Encounter for supervision of other normal pregnancy, unspecified trimester: Secondary | ICD-10-CM

## 2020-10-05 DIAGNOSIS — Z3A36 36 weeks gestation of pregnancy: Secondary | ICD-10-CM

## 2020-10-05 DIAGNOSIS — G5600 Carpal tunnel syndrome, unspecified upper limb: Secondary | ICD-10-CM

## 2020-10-05 DIAGNOSIS — O99013 Anemia complicating pregnancy, third trimester: Secondary | ICD-10-CM

## 2020-10-05 DIAGNOSIS — R350 Frequency of micturition: Secondary | ICD-10-CM

## 2020-10-05 DIAGNOSIS — O26899 Other specified pregnancy related conditions, unspecified trimester: Secondary | ICD-10-CM

## 2020-10-05 MED ORDER — FERROUS SULFATE 325 (65 FE) MG PO TABS: 325.0000 mg | ORAL_TABLET | ORAL | 1 refills | Status: DC

## 2020-10-05 MED ORDER — WRIST BRACE/RIGHT SMALL MISC: 1.0000 | Freq: Every day | 0 refills | Status: DC

## 2020-10-05 MED ORDER — WRIST BRACE/LEFT SMALL MISC: 1.0000 | Freq: Every day | 0 refills | Status: DC

## 2020-10-05 NOTE — Patient Instructions (Signed)
Things to Try After 37 weeks to Encourage Labor/Get Ready for Labor:   1.  Try the Miles Circuit at www.milescircuit.com daily to improve baby's position and encourage the onset of labor.  2. Walk a little and rest a little every day.  Change positions often.  3. Cervical Ripening: May try one or both a. Red Raspberry Leaf capsules or tea:  two 300mg or 400mg tablets with each meal, 2-3 times a day, or 1-3 cups of tea daily  Potential Side Effects Of Raspberry Leaf:  Most women do not experience any side effects from drinking raspberry leaf tea. However, nausea and loose stools are possible   b. Evening Primrose Oil capsules: take 1 capsule by mouth and place one capsule in the vagina every night.    Some of the potential side effects:  Upset stomach  Loose stools or diarrhea  Headaches  Nausea  4. Sex can also help the cervix ripen and encourage labor onset.    Labor Precautions Reasons to come to MAU at [facility]:  1.  Contractions are  5 minutes apart or less, each last 1 minute, these have been going on for 1-2 hours, and you cannot walk or talk during them 2.  You have a large gush of fluid, or a trickle of fluid that will not stop and you have to wear a pad 3.  You have bleeding that is bright red, heavier than spotting--like menstrual bleeding (spotting can be normal in early labor or after a check of your cervix) 4.  You do not feel the baby moving like he/she normally does 

## 2020-10-05 NOTE — Progress Notes (Signed)
   PRENATAL VISIT NOTE  Subjective:  Dana Flores is a 20 y.o. G3P1011 at [redacted]w[redacted]d being seen today for ongoing prenatal care.  She is currently monitored for the following issues for this low-risk pregnancy and has History of umbilical hernia repair; Obesity (BMI 30.0-34.9); Postpartum depression; Postpartum anxiety; Supervision of other normal pregnancy, antepartum; Uterine size date discrepancy pregnancy, third trimester; Hemorrhoids during pregnancy in third trimester; and Anemia in pregnancy, third trimester on their problem list.  Patient reports tingling in hands and frequent urination.  Contractions: Irregular. Vag. Bleeding: None.  Movement: Present. Denies leaking of fluid.   The following portions of the patient's history were reviewed and updated as appropriate: allergies, current medications, past family history, past medical history, past social history, past surgical history and problem list.   Objective:   Vitals:   10/05/20 1352  BP: 110/64  Pulse: 91  Weight: 185 lb (83.9 kg)    Fetal Status: Fetal Heart Rate (bpm): 160 Fundal Height: 36 cm Movement: Present  Presentation: Vertex  General:  Alert, oriented and cooperative. Patient is in no acute distress.  Skin: Skin is warm and dry. No rash noted.   Cardiovascular: Normal heart rate noted  Respiratory: Normal respiratory effort, no problems with respiration noted  Abdomen: Soft, gravid, appropriate for gestational age.  Pain/Pressure: Present     Pelvic: Cervical exam performed in the presence of a chaperone Dilation: Closed Effacement (%): 0 Station: Ballotable  Extremities: Normal range of motion.  Edema: Trace  Mental Status: Normal mood and affect. Normal behavior. Normal judgment and thought content.   Assessment and Plan:  Pregnancy: G3P1011 at [redacted]w[redacted]d 1. [redacted] weeks gestation of pregnancy  - Strep Gp B NAA - Cervicovaginal ancillary only( [place])  2. Supervision of other normal pregnancy,  antepartum --Anticipatory guidance about next visits/weeks of pregnancy given. --Next visit in 1 week in office  3. Anemia in pregnancy, third trimester --Pt completed iron infusions x 2 but does not like taking pills so is not taking oral iron. --She denies any s/sx of anemia and feels better than before she got the infusions --Will recheck today and discussed taking oral iron every other day and eating iron rich foods paired with vitamin C until delivery --Will check today, pt will consider another IV iron infusion if needed  - CBC - ferrous sulfate (FERROUSUL) 325 (65 FE) MG tablet; Take 1 tablet (325 mg total) by mouth every other day.  Dispense: 30 tablet; Refill: 1  4. Frequent urination --Pt reports frequent urination, sometimes unable to empty x 1 month, worsening in last 2 weeks.  May be third trimester pregnancy but will evaluation. - Culture, OB Urine  5. Carpal tunnel syndrome during pregnancy --Both hands with tingling in fingers, worse upon waking in am - Elastic Bandages & Supports (WRIST BRACE/LEFT SMALL) MISC; 1 Device by Does not apply route daily.  Dispense: 1 each; Refill: 0 - Elastic Bandages & Supports (WRIST BRACE/RIGHT SMALL) MISC; 1 Device by Does not apply route daily.  Dispense: 1 each; Refill: 0  Term labor symptoms and general obstetric precautions including but not limited to vaginal bleeding, contractions, leaking of fluid and fetal movement were reviewed in detail with the patient. Please refer to After Visit Summary for other counseling recommendations.   Return in about 1 week (around 10/12/2020).  Future Appointments  Date Time Provider Department Center  10/12/2020  1:15 PM Constant, Gigi Gin, MD [facility] None    Sharen Counter, CNM

## 2020-10-06 LAB — CBC
Hematocrit: 37.7 % (ref 34.0–46.6)
Hemoglobin: 12 g/dL (ref 11.1–15.9)
MCH: 26.2 pg — ABNORMAL LOW (ref 26.6–33.0)
MCHC: 31.8 g/dL (ref 31.5–35.7)
MCV: 82 fL (ref 79–97)
Platelets: 179 10*3/uL (ref 150–450)
RBC: 4.58 x10E6/uL (ref 3.77–5.28)
WBC: 8.2 10*3/uL (ref 3.4–10.8)

## 2020-10-07 ENCOUNTER — Inpatient Hospital Stay (HOSPITAL_COMMUNITY)
Admission: AD | Admit: 2020-10-07 | Discharge: 2020-10-07 | Disposition: A | Discharge status: Home / Self Care | Payer: Medicaid Other | Attending: Obstetrics and Gynecology | Admitting: Obstetrics and Gynecology

## 2020-10-07 ENCOUNTER — Encounter (HOSPITAL_COMMUNITY): Payer: Self-pay | Admitting: Obstetrics and Gynecology

## 2020-10-07 DIAGNOSIS — Z0371 Encounter for suspected problem with amniotic cavity and membrane ruled out: Secondary | ICD-10-CM

## 2020-10-07 DIAGNOSIS — O471 False labor at or after 37 completed weeks of gestation: Secondary | ICD-10-CM

## 2020-10-07 DIAGNOSIS — Z3A37 37 weeks gestation of pregnancy: Secondary | ICD-10-CM

## 2020-10-07 DIAGNOSIS — O479 False labor, unspecified: Secondary | ICD-10-CM

## 2020-10-07 LAB — STREP GP B NAA: Strep Gp B NAA: NEGATIVE

## 2020-10-07 LAB — CERVICOVAGINAL ANCILLARY ONLY
Chlamydia: NEGATIVE
Comment: NEGATIVE
Comment: NEGATIVE
Comment: NORMAL
Neisseria Gonorrhea: NEGATIVE
Trichomonas: NEGATIVE

## 2020-10-07 LAB — CULTURE, OB URINE

## 2020-10-07 LAB — URINE CULTURE, OB REFLEX

## 2020-10-07 NOTE — MAU Provider Note (Signed)
First Provider Initiated Contact with Patient 10/07/20 1733    S: [name] is a 20 y.o. G3P1011 at [redacted]w[redacted]d  who presents to MAU today complaining of leaking of fluid since 1600. She denies vaginal bleeding. She endorses contractions. She reports normal fetal movement.    O: BP 116/65   Pulse (!) 108   Temp 98.8 F (37.1 C)   Resp 17   Wt 84.5 kg   LMP 01/02/2020   BMI 37.62 kg/m  GENERAL: Well-developed, well-nourished female in no acute distress.  HEAD: Normocephalic, atraumatic.  CHEST: Normal effort of breathing, regular heart rate ABDOMEN: Soft, nontender, gravid PELVIC: Normal external female genitalia. Vagina is pink and rugated. Cervix with normal contour, no lesions. Normal discharge.  Negative pooling. White discharge noted   Cervical exam:  Dilation: 1.5 Effacement (%): Thick Exam by:: Venia Carbon, NP  Fetal Monitoring: Baseline: 145  Variability: Moderate  Accelerations: 15x15 Decelerations: None Contractions: Irregular   No results found for this or any previous visit (from the past 24 hour(s)).   A: SIUP at [redacted]w[redacted]d  Membranes intact  False labor  P: Fern slide negative  Return to MAU if symptoms worsen   Demetres Prochnow, Harolyn Rutherford, NP 10/07/2020 7:29 PM

## 2020-10-07 NOTE — MAU Note (Signed)
Patient here for possible SROM.  CTX feel more intense since having a gush of fluid around 1600.  Denies VB.  + FM.  Denies complications w/ her pregnancy.

## 2020-10-12 ENCOUNTER — Ambulatory Visit (INDEPENDENT_AMBULATORY_CARE_PROVIDER_SITE_OTHER): Payer: Medicaid Other | Admitting: Obstetrics and Gynecology

## 2020-10-12 ENCOUNTER — Other Ambulatory Visit: Payer: Self-pay

## 2020-10-12 ENCOUNTER — Encounter: Payer: Self-pay | Admitting: Obstetrics and Gynecology

## 2020-10-12 VITALS — BP 98/62 | HR 108 | Wt 188.1 lb

## 2020-10-12 DIAGNOSIS — Z3480 Encounter for supervision of other normal pregnancy, unspecified trimester: Secondary | ICD-10-CM

## 2020-10-12 DIAGNOSIS — O99013 Anemia complicating pregnancy, third trimester: Secondary | ICD-10-CM

## 2020-10-12 NOTE — Progress Notes (Signed)
   PRENATAL VISIT NOTE  Subjective:  Dana Flores is a 20 y.o. G3P1011 at [redacted]w[redacted]d being seen today for ongoing prenatal care.  She is currently monitored for the following issues for this low-risk pregnancy and has History of umbilical hernia repair; Obesity (BMI 30.0-34.9); Postpartum depression; Postpartum anxiety; Supervision of other normal pregnancy, antepartum; Uterine size date discrepancy pregnancy, third trimester; Hemorrhoids during pregnancy in third trimester; and Anemia in pregnancy, third trimester on their problem list.  Patient reports no complaints.  Contractions: Irregular. Vag. Bleeding: None.  Movement: Present. Denies leaking of fluid.   The following portions of the patient's history were reviewed and updated as appropriate: allergies, current medications, past family history, past medical history, past social history, past surgical history and problem list.   Objective:   Vitals:   10/12/20 1317  BP: 98/62  Pulse: (!) 108  Weight: 188 lb 1.6 oz (85.3 kg)    Fetal Status: Fetal Heart Rate (bpm): 155 Fundal Height: 36 cm Movement: Present  Presentation: Vertex  General:  Alert, oriented and cooperative. Patient is in no acute distress.  Skin: Skin is warm and dry. No rash noted.   Cardiovascular: Normal heart rate noted  Respiratory: Normal respiratory effort, no problems with respiration noted  Abdomen: Soft, gravid, appropriate for gestational age.  Pain/Pressure: Present     Pelvic: Cervical exam performed in the presence of a chaperone Dilation: Fingertip Effacement (%): Thick Station: Ballotable  Extremities: Normal range of motion.  Edema: Trace  Mental Status: Normal mood and affect. Normal behavior. Normal judgment and thought content.   Assessment and Plan:  Pregnancy: G3P1011 at [redacted]w[redacted]d 1. Supervision of other normal pregnancy, antepartum Patient is doing well without complaints Patient is interested in IOL at 39 weeks. Discussed with patient that it may  be a good option if cervix is favorable in order to minimize c-section. Also informed patient that IOL at 40 weeks is more reasonable if no cervical change.  2. Anemia in pregnancy, third trimester Encourage iron supplement  Term labor symptoms and general obstetric precautions including but not limited to vaginal bleeding, contractions, leaking of fluid and fetal movement were reviewed in detail with the patient. Please refer to After Visit Summary for other counseling recommendations.   Return in about 1 week (around 10/19/2020) for in person, ROB, Low risk.  No future appointments.  Catalina Antigua, MD

## 2020-10-18 ENCOUNTER — Inpatient Hospital Stay (HOSPITAL_COMMUNITY)
Admission: AD | Admit: 2020-10-18 | Discharge: 2020-10-19 | Disposition: A | Discharge status: Home / Self Care | Payer: Medicaid Other | Attending: Obstetrics and Gynecology | Admitting: Obstetrics and Gynecology

## 2020-10-18 ENCOUNTER — Other Ambulatory Visit: Payer: Self-pay

## 2020-10-18 ENCOUNTER — Encounter (HOSPITAL_COMMUNITY): Payer: Self-pay | Admitting: Obstetrics and Gynecology

## 2020-10-18 DIAGNOSIS — Z3A38 38 weeks gestation of pregnancy: Secondary | ICD-10-CM | POA: Diagnosis not present

## 2020-10-18 DIAGNOSIS — Z87891 Personal history of nicotine dependence: Secondary | ICD-10-CM | POA: Diagnosis not present

## 2020-10-18 DIAGNOSIS — O99613 Diseases of the digestive system complicating pregnancy, third trimester: Secondary | ICD-10-CM | POA: Diagnosis not present

## 2020-10-18 DIAGNOSIS — Z3689 Encounter for other specified antenatal screening: Secondary | ICD-10-CM

## 2020-10-18 DIAGNOSIS — K529 Noninfective gastroenteritis and colitis, unspecified: Secondary | ICD-10-CM | POA: Diagnosis not present

## 2020-10-18 DIAGNOSIS — O212 Late vomiting of pregnancy: Secondary | ICD-10-CM | POA: Diagnosis not present

## 2020-10-18 DIAGNOSIS — O368130 Decreased fetal movements, third trimester, not applicable or unspecified: Secondary | ICD-10-CM | POA: Diagnosis not present

## 2020-10-18 HISTORY — DX: Anemia, unspecified: D64.9

## 2020-10-18 LAB — CBC
HCT: 39.9 % (ref 36.0–46.0)
Hemoglobin: 12.8 g/dL (ref 12.0–15.0)
MCH: 26.7 pg (ref 26.0–34.0)
MCHC: 32.1 g/dL (ref 30.0–36.0)
MCV: 83.3 fL (ref 80.0–100.0)
Platelets: 163 10*3/uL (ref 150–400)
RBC: 4.79 MIL/uL (ref 3.87–5.11)
RDW: 22.7 % — ABNORMAL HIGH (ref 11.5–15.5)
WBC: 7.4 10*3/uL (ref 4.0–10.5)
nRBC: 0 % (ref 0.0–0.2)

## 2020-10-18 LAB — BASIC METABOLIC PANEL
Anion gap: 12 (ref 5–15)
BUN: 8 mg/dL (ref 6–20)
CO2: 17 mmol/L — ABNORMAL LOW (ref 22–32)
Calcium: 8.9 mg/dL (ref 8.9–10.3)
Chloride: 107 mmol/L (ref 98–111)
Creatinine, Ser: 0.56 mg/dL (ref 0.44–1.00)
GFR, Estimated: >60 mL/min (ref >60)
Glucose, Bld: 82 mg/dL (ref 70–99)
Potassium: 3.3 mmol/L — ABNORMAL LOW (ref 3.5–5.1)
Sodium: 136 mmol/L (ref 135–145)

## 2020-10-18 MED ORDER — FAMOTIDINE IN NACL 20-0.9 MG/50ML-% IV SOLN
20.0000 mg | Freq: Once | INTRAVENOUS | Status: AC
  Administered 2020-10-18: 20 mg via INTRAVENOUS
  Filled 2020-10-18: qty 50

## 2020-10-18 MED ORDER — ONDANSETRON HCL 4 MG/2ML IJ SOLN
4.0000 mg | Freq: Once | INTRAMUSCULAR | Status: AC
  Administered 2020-10-18: 4 mg via INTRAVENOUS
  Filled 2020-10-18: qty 2

## 2020-10-18 MED ORDER — LACTATED RINGERS IV BOLUS
1000.0000 mL | Freq: Once | INTRAVENOUS | Status: AC
  Administered 2020-10-18: 1000 mL via INTRAVENOUS

## 2020-10-18 NOTE — MAU Note (Signed)
Dana Flores is a 20 y.o. at [redacted]w[redacted]d here in MAU reporting: N/V beginning at 22 tonight. Reports she has vomited 3 times. Diarrhea began at 2200 x 2 stools.  T98.0 BP 127/63 HR 111 SAT 99&  Pt is G3P1 at 38.4 with an EDC of 10/28/2020  Pain score: 7 in lower back and over entire abdomen. Upper abdomen constant cramping and "bubble guts" and lower abdomen intermittent cramping. Pt denies SROM, vaginal bleeding or bloody show or vaginal discharge. Endorses + fetal movement  FHT:158

## 2020-10-18 NOTE — MAU Provider Note (Signed)
History     CSN: [id number]  Arrival date and time: 10/18/20 2207   Event Date/Time   First Provider Initiated Contact with Patient 10/18/20 2319      Chief Complaint  Patient presents with  . Nausea  . Emesis  . Diarrhea   Dana Flores is a 20 y.o. G3P1 at 105w4dwho presents to MAU with complaints of nausea, emesis, and diarrhea. Patient reports that symptoms started occurring this evening around 1900. She reports new onset of emesis and diarrhea, 3x vomiting since 1900 and 2x diarrhea. She denies any sick contacts or eating anything different/new prior to onset. Patient reports abdominal pain is associated with symptoms. Patient reports lower abdominal cramping that is intermittent like contractions. Patient also reports upper abdominal pain that is constant cramping like bubbling. Rates pain 7/10. Patient denies discharge, LOF, vaginal bleeding. +FM.    OB History    Gravida  3   Para  1   Term  1   Preterm  0   AB  1   Living  1     SAB  0   IAB  1   Ectopic  0   Multiple  0   Live Births  1           Past Medical History:  Diagnosis Date  . Anemia   . Schizophrenia spectrum disorder with psychotic disorder type not yet determined ([facility] 11/[place]  . Umbilical hernia     Past Surgical History:  Procedure Laterality Date  . TOOTH EXTRACTION  2014  . UMBILICAL HERNIA REPAIR      Family History  Problem Relation Age of Onset  . ADD / ADHD Father   . Alcohol abuse Father   . Asthma Father   . Drug abuse Father   . Hypertension Father   . Stroke Father   . ADD / ADHD Brother   . Anxiety disorder Brother   . Asthma Brother   . Heart disease Maternal Grandmother   . Obesity Maternal Grandmother   . Early death Maternal Grandmother   . Diabetes Paternal Grandmother   . Hyperlipidemia Paternal Grandmother   . Obesity Paternal Grandmother     Social History   Tobacco Use  . Smoking status: Former Smoker    Types: E-cigarettes  . Smokeless  tobacco: Never Used  . Tobacco comment: last used a month ago  Vaping Use  . Vaping Use: Never used  Substance Use Topics  . Alcohol use: Not Currently    Comment: last drink was a month ago  . Drug use: Not Currently    Comment: pt. states she hasn't used since 2017    Allergies: No Known Allergies  Medications Prior to Admission  Medication Sig Dispense Refill Last Dose  . Blood Pressure Monitoring (BLOOD PRESSURE KIT) DEVI 1 kit by Does not apply route once a week. (Patient not taking: No sig reported) 1 each 0   . docusate sodium (COLACE) 100 MG capsule Take 1 capsule (100 mg total) by mouth 2 (two) times daily as needed. (Patient not taking: No sig reported) 30 capsule 2   . Doxylamine-Pyridoxine (DICLEGIS) 10-10 MG TBEC Take 2 tabs qhs then add 1 tab A.M and midday prn (Patient not taking: No sig reported) 90 tablet 6   . Elastic Bandages & Supports (WRIST BRACE/LEFT SMALL) MISC 1 Device by Does not apply route daily. (Patient not taking: Reported on 10/12/2020) 1 each 0   . Elastic Bandages & Supports (  WRIST BRACE/RIGHT SMALL) MISC 1 Device by Does not apply route daily. (Patient not taking: Reported on 10/12/2020) 1 each 0   . famotidine (PEPCID) 20 MG tablet Take 1 tablet (20 mg total) by mouth 2 (two) times daily. (Patient not taking: No sig reported) 20 tablet 0   . ferrous sulfate (FERROUSUL) 325 (65 FE) MG tablet Take 1 tablet (325 mg total) by mouth every other day. 30 tablet 1   . hydrocortisone (ANUSOL-HC) 2.5 % rectal cream Place 1 application rectally 2 (two) times daily. (Patient not taking: No sig reported) 30 g 0   . metroNIDAZOLE (FLAGYL) 500 MG tablet Take 500 mg by mouth 3 (three) times daily. (Patient not taking: No sig reported)     . Misc. Devices (DIGITAL GLASS SCALE) MISC 1 Device by Does not apply route as directed. (Patient not taking: No sig reported) 1 each 0   . Misc. Devices MISC Dispense one maternity belt for patient (Patient not taking: No sig reported) 1  each 0   . ondansetron (ZOFRAN) 4 MG tablet Take 4 mg by mouth every 8 (eight) hours as needed for nausea or vomiting. (Patient not taking: No sig reported)     . Prenatal Vit-Fe Fumarate-FA (PRENATAL VITAMIN) 27-0.8 MG TABS Take 1 tablet by mouth daily. (Patient not taking: No sig reported) 30 tablet 11   . Prenatal Vit-Fe Phos-FA-Omega (VITAFOL GUMMIES) 3.33-0.333-34.8 MG CHEW Chew 3 tablets by mouth daily before breakfast. (Patient not taking: No sig reported) 90 tablet 11     Review of Systems  Constitutional: Negative.   HENT: Negative.   Respiratory: Negative.   Cardiovascular: Negative.   Gastrointestinal: Positive for abdominal pain, diarrhea, nausea and vomiting. Negative for constipation.  Genitourinary: Negative.   Musculoskeletal: Negative.   Neurological: Negative.   Psychiatric/Behavioral: Negative.    Physical Exam   Blood pressure 120/63, pulse (!) 114, temperature 98 F (36.7 C), temperature source Oral, resp. rate 18, height 4' 11.02" (1.499 m), weight 84.4 kg, last menstrual period 01/02/2020, SpO2 99 %.  Physical Exam Vitals and nursing note reviewed.  HENT:     Head: Normocephalic.  Cardiovascular:     Rate and Rhythm: Normal rate.  Pulmonary:     Effort: Pulmonary effort is normal. No respiratory distress.     Breath sounds: Normal breath sounds. No wheezing.  Abdominal:     Palpations: Abdomen is soft. There is no mass.     Tenderness: There is abdominal tenderness in the epigastric area. There is no guarding.  Musculoskeletal:     Right lower leg: No edema.     Left lower leg: No edema.  Skin:    General: Skin is warm and dry.  Neurological:     Mental Status: She is alert and oriented to person, place, and time.  Psychiatric:        Mood and Affect: Mood normal.        Behavior: Behavior normal.        Thought Content: Thought content normal.    Fetal monitoring:  120/moderate/+accels/ no decelerations  6-7 minutes/mild by palpation    Dilation: 1.5 Effacement (%): Thick Cervical Position: Posterior Station: -3 Presentation: Vertex Exam by:: Wende Bushy CNM  MAU Course  Procedures  MDM Orders Placed This Encounter  Procedures  . Urinalysis, Routine w reflex microscopic Urine, Clean Catch  . CBC  . Basic metabolic panel  . Contraction - monitoring  . External fetal heart monitoring  . Vaginal exam  .  Insert peripheral IV   Meds ordered this encounter  Medications  . lactated ringers bolus 1,000 mL  . ondansetron (ZOFRAN) injection 4 mg  . famotidine (PEPCID) IVPB 20 mg premix  . lactated ringers bolus 1,000 mL  . promethazine (PHENERGAN) injection 12.5 mg  . multivitamins adult (INFUVITE ADULT) 10 mL in lactated ringers 1,000 mL infusion  . ondansetron (ZOFRAN ODT) 4 MG disintegrating tablet    Sig: Take 1 tablet (4 mg total) by mouth every 8 (eight) hours as needed for nausea or vomiting.    Dispense:  30 tablet    Refill:  1    Order Specific Question:   Supervising Provider    Answer:   Griffin Basil [id number]   Reassessment @ [phone] - patient is feeling better after initial treatment. Labs reviewed- K slightly decreased otherwise normal. Patient able to urinate at this time, will assess for dehydration.  Patient denies continued emesis, diarrhea or contractions since hydration, zofran and pepcid.   Reassessment @ 0215- patient reports that nausea and vomiting is starting to return and asked for additional pain medication. UA noted 80 ketones. IV LR bolus x2 ordered in addition to phenergan IV.   Reassessment @ 0345- patient reports resolution of N/V. Reports continued uterine contractions. Cervix rechecked - unchanged. MVI ordered and given to patient prior to discharge home for continued rehydration.   Discussed reasons to return to MAU. Follow up as scheduled in the office. Return to MAU as needed. Pt stable at time of discharge. Rx for zofran sent to pharmacy of choice.  Assessment and Plan    1. Gastroenteritis, acute   2. [redacted] weeks gestation of pregnancy   3. NST (non-stress test) reactive    Discharge home Follow up as scheduled in the office for prenatal care Return to MAU as needed for reasons discussed and/or emergencies  Rx for zofran    Follow-up [facility] Follow up.   Specialty: Obstetrics and Gynecology Contact information: [address] [place] [phone]             Allergies as of 10/19/2020   No Known Allergies     Medication List    STOP taking these medications   Doxylamine-Pyridoxine 10-10 MG Tbec Commonly known as: Diclegis   metroNIDAZOLE 500 MG tablet Commonly known as: FLAGYL   ondansetron 4 MG tablet Commonly known as: ZOFRAN   Vitafol Gummies 3.33-0.333-34.8 MG Chew     TAKE these medications   Blood Pressure Kit Devi 1 kit by Does not apply route once a week.   Digital Glass Scale Misc 1 Device by Does not apply route as directed.   Misc. Devices Misc Dispense one maternity belt for patient   docusate sodium 100 MG capsule Commonly known as: COLACE Take 1 capsule (100 mg total) by mouth 2 (two) times daily as needed.   famotidine 20 MG tablet Commonly known as: PEPCID Take 1 tablet (20 mg total) by mouth 2 (two) times daily.   ferrous sulfate 325 (65 FE) MG tablet Commonly known as: FerrouSul Take 1 tablet (325 mg total) by mouth every other day.   hydrocortisone 2.5 % rectal cream Commonly known as: Anusol-HC Place 1 application rectally 2 (two) times daily.   ondansetron 4 MG disintegrating tablet Commonly known as: Zofran ODT Take 1 tablet (4 mg total) by mouth every 8 (eight) hours as needed for nausea or vomiting.  Prenatal Vitamin 27-0.8 MG Tabs Take 1 tablet by mouth daily.   Wrist Brace/Left Small Misc 1 Device by Does not apply route daily.   Wrist Brace/Right Small Misc 1 Device by Does not apply route  daily.       Lajean Manes CNM 10/19/2020, 3:21 AM

## 2020-10-18 NOTE — MAU Note (Signed)
Prior to Zofran administration pt vomited of yellow/green emesis.

## 2020-10-19 ENCOUNTER — Ambulatory Visit (INDEPENDENT_AMBULATORY_CARE_PROVIDER_SITE_OTHER): Payer: Medicaid Other | Admitting: Advanced Practice Midwife

## 2020-10-19 ENCOUNTER — Inpatient Hospital Stay (HOSPITAL_COMMUNITY)
Admission: AD | Admit: 2020-10-19 | Discharge: 2020-10-19 | Disposition: A | Discharge status: Home / Self Care | Payer: Medicaid Other | Attending: Family Medicine | Admitting: Family Medicine

## 2020-10-19 VITALS — BP 106/62 | HR 104 | Wt 188.0 lb

## 2020-10-19 DIAGNOSIS — O368130 Decreased fetal movements, third trimester, not applicable or unspecified: Secondary | ICD-10-CM

## 2020-10-19 DIAGNOSIS — Z3A38 38 weeks gestation of pregnancy: Secondary | ICD-10-CM

## 2020-10-19 DIAGNOSIS — K529 Noninfective gastroenteritis and colitis, unspecified: Secondary | ICD-10-CM

## 2020-10-19 DIAGNOSIS — O479 False labor, unspecified: Secondary | ICD-10-CM

## 2020-10-19 DIAGNOSIS — Z3689 Encounter for other specified antenatal screening: Secondary | ICD-10-CM

## 2020-10-19 DIAGNOSIS — O99613 Diseases of the digestive system complicating pregnancy, third trimester: Secondary | ICD-10-CM | POA: Diagnosis not present

## 2020-10-19 DIAGNOSIS — O99013 Anemia complicating pregnancy, third trimester: Secondary | ICD-10-CM

## 2020-10-19 DIAGNOSIS — Z3480 Encounter for supervision of other normal pregnancy, unspecified trimester: Secondary | ICD-10-CM

## 2020-10-19 LAB — URINALYSIS, ROUTINE W REFLEX MICROSCOPIC
Bacteria, UA: NONE SEEN
Bilirubin Urine: NEGATIVE
Glucose, UA: NEGATIVE mg/dL
Hgb urine dipstick: NEGATIVE
Ketones, ur: 80 mg/dL — AB
Leukocytes,Ua: NEGATIVE
Nitrite: NEGATIVE
Protein, ur: 30 mg/dL — AB
Specific Gravity, Urine: 1.025 (ref 1.005–1.030)
pH: 5 (ref 5.0–8.0)

## 2020-10-19 MED ORDER — PROMETHAZINE HCL 25 MG/ML IJ SOLN
12.5000 mg | Freq: Once | INTRAMUSCULAR | Status: AC
  Administered 2020-10-19: 12.5 mg via INTRAVENOUS
  Filled 2020-10-19: qty 1

## 2020-10-19 MED ORDER — M.V.I. ADULT IV INJ
Freq: Once | INTRAVENOUS | Status: AC
  Filled 2020-10-19: qty 10

## 2020-10-19 MED ORDER — PANTOPRAZOLE SODIUM 40 MG PO TBEC: 40.0000 mg | DELAYED_RELEASE_TABLET | Freq: Every day | ORAL | 0 refills | Status: DC

## 2020-10-19 MED ORDER — LACTATED RINGERS IV BOLUS
1000.0000 mL | Freq: Once | INTRAVENOUS | Status: AC
  Administered 2020-10-19: 1000 mL via INTRAVENOUS

## 2020-10-19 MED ORDER — ONDANSETRON 4 MG PO TBDP: 4.0000 mg | ORAL_TABLET | Freq: Three times a day (TID) | ORAL | 1 refills | Status: DC | PRN

## 2020-10-19 NOTE — Progress Notes (Signed)
   PRENATAL VISIT NOTE  Subjective:  Dana Flores is a 20 y.o. G3P1011 at [redacted]w[redacted]d being seen today for ongoing prenatal care.  She is currently monitored for the following issues for this low-risk pregnancy and has History of umbilical hernia repair; Obesity (BMI 30.0-34.9); Postpartum depression; Postpartum anxiety; Supervision of other normal pregnancy, antepartum; Uterine size date discrepancy pregnancy, third trimester; Hemorrhoids during pregnancy in third trimester; and Anemia in pregnancy, third trimester on their problem list.  Patient reports contractions every 6-7 minutes, nausea and vomiting \.  Contractions: Irregular. Vag. Bleeding: None.  Movement: Present. Denies leaking of fluid.   The following portions of the patient's history were reviewed and updated as appropriate: allergies, current medications, past family history, past medical history, past social history, past surgical history and problem list.   Objective:   Vitals:   10/19/20 1517  BP: 106/62  Pulse: (!) 104  Weight: 188 lb (85.3 kg)    Fetal Status: Fetal Heart Rate (bpm): 146 Fundal Height: 37 cm Movement: Present  Presentation: Vertex  General:  Alert, oriented and cooperative. Patient is in no acute distress.  Skin: Skin is warm and dry. No rash noted.   Cardiovascular: Normal heart rate noted  Respiratory: Normal respiratory effort, no problems with respiration noted  Abdomen: Soft, gravid, appropriate for gestational age.  Pain/Pressure: Present     Pelvic: Cervical exam performed in the presence of a chaperone Dilation: 1.5 Effacement (%): Thick Station: -2  Extremities: Normal range of motion.  Edema: Trace  Mental Status: Normal mood and affect. Normal behavior. Normal judgment and thought content.   Assessment and Plan:  Pregnancy: G3P1011 at [redacted]w[redacted]d 1. Anemia in pregnancy, third trimester --Resolved with normal Hgb at 36 weeks  2. [redacted] weeks gestation of pregnancy   3. Gastroenteritis,  acute --Pt with MAU visit last night for n/v/d, contractions.  Received antiemetics and 3 bags of IV fluids. Diarrhea is resolved but reports she still can't keep anything down.  She has not picked up Zofran Rx prescribed last night, will pick up after visit today. --Add PPI due to acid, start after pt tolerating PO with Zofran - pantoprazole (PROTONIX) 40 MG tablet; Take 1 tablet (40 mg total) by mouth daily.  Dispense: 30 tablet; Refill: 0  4. Decreased fetal movements in third trimester, single or unspecified fetus --Decreased since leaving MAU last night, some movement but not as much as usual. --Variable x 1, otherwise reactive NST --Consult Dr Crissie Reese, pt to MAU for extended monitoring  - Fetal nonstress test  5. Supervision of other normal pregnancy, antepartum --Anticipatory guidance about next visits/weeks of pregnancy given. --Cervix unchanged today, pt reporting contractions Q 6-7 minutes --DFM with variable on NST, pt to MAU for further monitoring  --If pt discharged, IOL scheduled at 39 weeks at pt request. Discussed risk of longer labor, increased risk of cesarean with IOL. Pt states understanding. --Reviewed signs of labor/reasons to seek care --Labor readiness/things to try reviewed  Term labor symptoms and general obstetric precautions including but not limited to vaginal bleeding, contractions, leaking of fluid and fetal movement were reviewed in detail with the patient. Please refer to After Visit Summary for other counseling recommendations.   No follow-ups on file.  Future Appointments  Date Time Provider Department Center  10/26/2020  2:40 PM Left[place]-Kirby, Wilmer Floor, CNM [facility] None    Sharen Counter, CNM

## 2020-10-19 NOTE — Discharge Instructions (Signed)

## 2020-10-19 NOTE — MAU Note (Signed)
Pt vomiting large amount of yellow emesis. Pt refused cervical exam at this time. Denies vaginal or rectal pressure or urge to push. No bloody show or vaginal bleeding. Contractions remain irregular and mild to palpation

## 2020-10-19 NOTE — MAU Provider Note (Signed)
History     CSN: [id number]  Arrival date and time: 10/19/20 1846   Event Date/Time   First Provider Initiated Contact with Patient 10/19/20 1932      Chief Complaint  Patient presents with  . Contractions  . Decreased Fetal Movement   HPI Dana Flores is a 20 y.o. G3P1011 at [redacted]w[redacted]d She [facility] and visualization of a variable deceleration during her NST in the office. She also c/o recurrent uncomfortable contractions "here and there". She denies vaginal bleeding, dysuria, abdominal tenderness, fever or recent illness.  Patient [facility] is scheduled for elective IOL at 39 weeks.  OB History    Gravida  3   Para  1   Term  1   Preterm  0   AB  1   Living  1     SAB  0   IAB  1   Ectopic  0   Multiple  0   Live Births  1           Past Medical History:  Diagnosis Date  . Anemia   . Schizophrenia spectrum disorder with psychotic disorder type not yet determined [place]  . Umbilical hernia     Past Surgical History:  Procedure Laterality Date  . TOOTH EXTRACTION  2014  . UMBILICAL HERNIA REPAIR      Family History  Problem Relation Age of Onset  . ADD / ADHD Father   . Alcohol abuse Father   . Asthma Father   . Drug abuse Father   . Hypertension Father   . Stroke Father   . ADD / ADHD Brother   . Anxiety disorder Brother   . Asthma Brother   . Heart disease Maternal Grandmother   . Obesity Maternal Grandmother   . Early death Maternal Grandmother   . Diabetes Paternal Grandmother   . Hyperlipidemia Paternal Grandmother   . Obesity Paternal Grandmother     Social History   Tobacco Use  . Smoking status: Former Smoker    Types: E-cigarettes  . Smokeless tobacco: Never Used  . Tobacco comment: last used a month ago  Vaping Use  . Vaping Use: Never used  Substance Use Topics  . Alcohol use: Not Currently    Comment: last drink was a month ago  . Drug  use: Not Currently    Comment: pt. states she hasn't used since 2017    Allergies: No Known Allergies  Medications Prior to Admission  Medication Sig Dispense Refill Last Dose  . Blood Pressure Monitoring (BLOOD PRESSURE KIT) DEVI 1 kit by Does not apply route once a week. (Patient not taking: No sig reported) 1 each 0   . docusate sodium (COLACE) 100 MG capsule Take 1 capsule (100 mg total) by mouth 2 (two) times daily as needed. (Patient not taking: No sig reported) 30 capsule 2   . Elastic Bandages & Supports (WRIST BRACE/LEFT SMALL) MISC 1 Device by Does not apply route daily. (Patient not taking: No sig reported) 1 each 0   . Elastic Bandages & Supports (WRIST BRACE/RIGHT SMALL) MISC 1 Device by Does not apply route daily. (Patient not taking: No sig reported) 1 each 0   . famotidine (PEPCID) 20 MG tablet Take 1 tablet (20 mg total) by mouth 2 (two) times daily. (Patient not taking: No sig reported) 20 tablet 0   . ferrous sulfate (FERROUSUL) 325 (65 FE)  MG tablet Take 1 tablet (325 mg total) by mouth every other day. (Patient not taking: Reported on 10/19/2020) 30 tablet 1   . hydrocortisone (ANUSOL-HC) 2.5 % rectal cream Place 1 application rectally 2 (two) times daily. (Patient not taking: No sig reported) 30 g 0   . Misc. Devices (DIGITAL GLASS SCALE) MISC 1 Device by Does not apply route as directed. (Patient not taking: No sig reported) 1 each 0   . Misc. Devices MISC Dispense one maternity belt for patient (Patient not taking: No sig reported) 1 each 0   . ondansetron (ZOFRAN ODT) 4 MG disintegrating tablet Take 1 tablet (4 mg total) by mouth every 8 (eight) hours as needed for nausea or vomiting. 30 tablet 1   . pantoprazole (PROTONIX) 40 MG tablet Take 1 tablet (40 mg total) by mouth daily. 30 tablet 0   . Prenatal Vit-Fe Fumarate-FA (PRENATAL VITAMIN) 27-0.8 MG TABS Take 1 tablet by mouth daily. 30 tablet 11     Review of Systems  Gastrointestinal: Positive for abdominal pain.   Genitourinary: Negative for vaginal bleeding.  Musculoskeletal: Negative for back pain.  All other systems reviewed and are negative.  Physical Exam   Blood pressure 122/65, pulse (!) 111, temperature 99.1 F (37.3 C), resp. rate 18, last menstrual period 01/02/2020.  Physical Exam Vitals and nursing note reviewed. Exam conducted with a chaperone present.  Constitutional:      Appearance: Normal appearance.  Cardiovascular:     Rate and Rhythm: Normal rate.     Pulses: Normal pulses.  Pulmonary:     Effort: Pulmonary effort is normal.  Abdominal:     Tenderness: There is no abdominal tenderness. There is no right CVA tenderness or left CVA tenderness.     Comments: Gravid  Skin:    General: Skin is warm and dry.     Capillary Refill: Capillary refill takes less than 2 seconds.  Neurological:     Mental Status: She is oriented to person, place, and time.  Psychiatric:        Mood and Affect: Mood normal.        Behavior: Behavior normal.        Thought Content: Thought content normal.        Judgment: Judgment normal.     MAU Course  Procedures  --Reactive tracing: baseline 135, mod var, + 15 x 15 accels, no decels --Toco: irregular ctx q 5-9 min apart --Cervix unchanged  Patient Vitals for the past 24 hrs:  BP Temp Pulse Resp  10/19/20 2037 (!) 110/49 -- (!) 102 18  10/19/20 1925 122/65 99.1 F (37.3 C) (!) 111 18   Assessment and Plan  --20 y.o. G3P1011 at [redacted]w[redacted]d --S/p one hour continuous monitoring, no acute events --Cervix unchanged from previous --Discharge home in stable condition  SDarlina Rumpf CNM 10/19/2020, 8:55 PM

## 2020-10-19 NOTE — MAU Note (Signed)
Pt went from office for decreased fetal movement and a decel on NST. Pt stated she really had not  Felt baby move much since yesterday. Pt was seen at [facility] last night for N/V/D. Baby had moved much since then. Had not eaten much today.  C/o ctx about 7 mi apart. Denied any vag bleeding or leaking.

## 2020-10-19 NOTE — MAU Note (Addendum)
Pt states nausea has resolved. No further emesis. No additional stools. Head of bed down. Pt states she feels a little acid reflux. Repositioned to R side. External fetal and labor monitors repositioned. Also states she is not feeling contractions or lower uterine cramping

## 2020-10-19 NOTE — MAU Note (Signed)
Pt states she is feeling contractions returning. Will re evaluate after urination. Back to bed. Pt back to R side

## 2020-10-19 NOTE — MAU Note (Signed)
Pt states she does not feel that she could urinate yet/ Reminded pt a urine sample needs to be collected to call for assistance when ready

## 2020-10-19 NOTE — MAU Note (Signed)
Clean catch UA collected and sent.

## 2020-10-19 NOTE — Patient Instructions (Signed)
Things to Try After 37 weeks to Encourage Labor/Get Ready for Labor:   1.  Try the Miles Circuit at www.milescircuit.com daily to improve baby's position and encourage the onset of labor.  2. Walk a little and rest a little every day.  Change positions often.  3. Cervical Ripening: May try one or both a. Red Raspberry Leaf capsules or tea:  two 300mg or 400mg tablets with each meal, 2-3 times a day, or 1-3 cups of tea daily  Potential Side Effects Of Raspberry Leaf:  Most women do not experience any side effects from drinking raspberry leaf tea. However, nausea and loose stools are possible   b. Evening Primrose Oil capsules: take 1 capsule by mouth and place one capsule in the vagina every night.    Some of the potential side effects:  Upset stomach  Loose stools or diarrhea  Headaches  Nausea  4. Sex can also help the cervix ripen and encourage labor onset.    Labor Precautions Reasons to come to MAU at [facility]:  1.  Contractions are  5 minutes apart or less, each last 1 minute, these have been going on for 1-2 hours, and you cannot walk or talk during them 2.  You have a large gush of fluid, or a trickle of fluid that will not stop and you have to wear a pad 3.  You have bleeding that is bright red, heavier than spotting--like menstrual bleeding (spotting can be normal in early labor or after a check of your cervix) 4.  You do not feel the baby moving like he/she normally does 

## 2020-10-21 ENCOUNTER — Other Ambulatory Visit (INDEPENDENT_AMBULATORY_CARE_PROVIDER_SITE_OTHER): Payer: Medicaid Other | Admitting: Advanced Practice Midwife

## 2020-10-21 ENCOUNTER — Encounter (HOSPITAL_COMMUNITY): Payer: Self-pay | Admitting: *Deleted

## 2020-10-21 ENCOUNTER — Other Ambulatory Visit: Payer: Self-pay | Admitting: Advanced Practice Midwife

## 2020-10-21 ENCOUNTER — Other Ambulatory Visit (HOSPITAL_COMMUNITY)
Admission: RE | Admit: 2020-10-21 | Discharge: 2020-10-21 | Disposition: A | Discharge status: Home / Self Care | Payer: Medicaid Other | Source: Ambulatory Visit | Attending: Family Medicine | Admitting: Family Medicine

## 2020-10-21 ENCOUNTER — Telehealth (HOSPITAL_COMMUNITY): Payer: Self-pay | Admitting: *Deleted

## 2020-10-21 DIAGNOSIS — Z20822 Contact with and (suspected) exposure to covid-19: Secondary | ICD-10-CM | POA: Diagnosis not present

## 2020-10-21 DIAGNOSIS — Z01812 Encounter for preprocedural laboratory examination: Secondary | ICD-10-CM | POA: Insufficient documentation

## 2020-10-21 DIAGNOSIS — Z3483 Encounter for supervision of other normal pregnancy, third trimester: Secondary | ICD-10-CM

## 2020-10-21 LAB — SARS CORONAVIRUS 2 (TAT 6-24 HRS): SARS Coronavirus 2: NEGATIVE

## 2020-10-21 NOTE — Progress Notes (Signed)
Patient planned IOL at 39.0, which would have been today 10/21/2020. Patient not on Faculty census for scheduled induction. Chart demonstrates induction on 10/28/2020 at 40 weeks. Patient called at home, apology given for error in induction date. CNM able to shift date to this Friday 10/23/2020. Patient called back, notified of updated date, reviewed phone notification, importance of keeping phone next to her to receive call, arrive to hospital within one year.  IOL orders placed.  Clayton Bibles, MSN, CNM Certified Nurse Midwife, [facility] for [employer], [facility] 10/21/20 9:40 AM

## 2020-10-21 NOTE — Telephone Encounter (Signed)
Preadmission screen  

## 2020-10-23 ENCOUNTER — Inpatient Hospital Stay (HOSPITAL_COMMUNITY): Admission: AD | Admit: 2020-10-23 | Payer: Medicaid Other | Source: Home / Self Care | Admitting: Family Medicine

## 2020-10-23 ENCOUNTER — Inpatient Hospital Stay (HOSPITAL_COMMUNITY): Payer: Medicaid Other

## 2020-10-23 ENCOUNTER — Encounter (HOSPITAL_COMMUNITY): Payer: Self-pay | Admitting: Obstetrics & Gynecology

## 2020-10-23 ENCOUNTER — Other Ambulatory Visit: Payer: Self-pay

## 2020-10-23 ENCOUNTER — Inpatient Hospital Stay (HOSPITAL_COMMUNITY): Payer: Medicaid Other | Admitting: Anesthesiology

## 2020-10-23 ENCOUNTER — Inpatient Hospital Stay (HOSPITAL_COMMUNITY)
Admission: AD | Admit: 2020-10-23 | Discharge: 2020-10-24 | DRG: 807 | Disposition: A | Discharge status: Home / Self Care | Payer: Medicaid Other | Attending: Family Medicine | Admitting: Family Medicine

## 2020-10-23 DIAGNOSIS — O9902 Anemia complicating childbirth: Secondary | ICD-10-CM | POA: Diagnosis present

## 2020-10-23 DIAGNOSIS — D649 Anemia, unspecified: Secondary | ICD-10-CM | POA: Diagnosis present

## 2020-10-23 DIAGNOSIS — E669 Obesity, unspecified: Secondary | ICD-10-CM | POA: Diagnosis present

## 2020-10-23 DIAGNOSIS — Z3A39 39 weeks gestation of pregnancy: Secondary | ICD-10-CM

## 2020-10-23 DIAGNOSIS — Z87891 Personal history of nicotine dependence: Secondary | ICD-10-CM

## 2020-10-23 DIAGNOSIS — O99214 Obesity complicating childbirth: Secondary | ICD-10-CM | POA: Diagnosis present

## 2020-10-23 DIAGNOSIS — O26893 Other specified pregnancy related conditions, third trimester: Secondary | ICD-10-CM | POA: Diagnosis present

## 2020-10-23 LAB — CBC
HCT: 34.1 % — ABNORMAL LOW (ref 36.0–46.0)
Hemoglobin: 12.4 g/dL (ref 12.0–15.0)
MCH: 29.6 pg (ref 26.0–34.0)
MCHC: 36.4 g/dL — ABNORMAL HIGH (ref 30.0–36.0)
MCV: 81.4 fL (ref 80.0–100.0)
Platelets: 205 10*3/uL (ref 150–400)
RBC: 4.19 MIL/uL (ref 3.87–5.11)
RDW: 22.5 % — ABNORMAL HIGH (ref 11.5–15.5)
WBC: 5.6 10*3/uL (ref 4.0–10.5)
nRBC: 0 % (ref 0.0–0.2)

## 2020-10-23 LAB — TYPE AND SCREEN
ABO/RH(D): O POS
Antibody Screen: NEGATIVE

## 2020-10-23 MED ORDER — LACTATED RINGERS IV SOLN: 500.0000 mL | INTRAVENOUS | Status: DC | PRN

## 2020-10-23 MED ORDER — MISOPROSTOL 25 MCG QUARTER TABLET: 25.0000 ug | ORAL_TABLET | ORAL | Status: DC | PRN

## 2020-10-23 MED ORDER — TETANUS-DIPHTH-ACELL PERTUSSIS 5-2.5-18.5 LF-MCG/0.5 IM SUSY: 0.5000 mL | PREFILLED_SYRINGE | Freq: Once | INTRAMUSCULAR | Status: DC

## 2020-10-23 MED ORDER — DIPHENHYDRAMINE HCL 50 MG/ML IJ SOLN: 12.5000 mg | INTRAMUSCULAR | Status: DC | PRN

## 2020-10-23 MED ORDER — TERBUTALINE SULFATE 1 MG/ML IJ SOLN: 0.2500 mg | Freq: Once | INTRAMUSCULAR | Status: DC | PRN

## 2020-10-23 MED ORDER — FENTANYL CITRATE (PF) 100 MCG/2ML IJ SOLN
100.0000 ug | INTRAMUSCULAR | Status: DC | PRN
  Administered 2020-10-23 (×2): 100 ug via INTRAVENOUS

## 2020-10-23 MED ORDER — DIBUCAINE (PERIANAL) 1 % EX OINT: 1.0000 "application " | TOPICAL_OINTMENT | CUTANEOUS | Status: DC | PRN

## 2020-10-23 MED ORDER — LACTATED RINGERS IV SOLN: INTRAVENOUS | Status: DC

## 2020-10-23 MED ORDER — EPHEDRINE 5 MG/ML INJ: 10.0000 mg | INTRAVENOUS | Status: DC | PRN

## 2020-10-23 MED ORDER — FENTANYL-BUPIVACAINE-NACL 0.5-0.125-0.9 MG/250ML-% EP SOLN
EPIDURAL | Status: DC | PRN
  Administered 2020-10-23: 12 mL/h via EPIDURAL

## 2020-10-23 MED ORDER — ONDANSETRON HCL 4 MG/2ML IJ SOLN: 4.0000 mg | Freq: Four times a day (QID) | INTRAMUSCULAR | Status: DC | PRN

## 2020-10-23 MED ORDER — DIPHENHYDRAMINE HCL 25 MG PO CAPS: 25.0000 mg | ORAL_CAPSULE | Freq: Four times a day (QID) | ORAL | Status: DC | PRN

## 2020-10-23 MED ORDER — OXYTOCIN-SODIUM CHLORIDE 30-0.9 UT/500ML-% IV SOLN
2.5000 [IU]/h | INTRAVENOUS | Status: DC
  Filled 2020-10-23: qty 500

## 2020-10-23 MED ORDER — IBUPROFEN 600 MG PO TABS
600.0000 mg | ORAL_TABLET | Freq: Four times a day (QID) | ORAL | Status: DC
  Administered 2020-10-23: 600 mg via ORAL
  Filled 2020-10-23: qty 1

## 2020-10-23 MED ORDER — SIMETHICONE 80 MG PO CHEW: 80.0000 mg | CHEWABLE_TABLET | ORAL | Status: DC | PRN

## 2020-10-23 MED ORDER — PHENYLEPHRINE 40 MCG/ML (10ML) SYRINGE FOR IV PUSH (FOR BLOOD PRESSURE SUPPORT): 80.0000 ug | PREFILLED_SYRINGE | INTRAVENOUS | Status: DC | PRN

## 2020-10-23 MED ORDER — LACTATED RINGERS IV SOLN
500.0000 mL | Freq: Once | INTRAVENOUS | Status: AC
  Administered 2020-10-23: 500 mL via INTRAVENOUS

## 2020-10-23 MED ORDER — FENTANYL-BUPIVACAINE-NACL 0.5-0.125-0.9 MG/250ML-% EP SOLN: 12.0000 mL/h | EPIDURAL | Status: DC | PRN

## 2020-10-23 MED ORDER — COCONUT OIL OIL: 1.0000 "application " | TOPICAL_OIL | Status: DC | PRN

## 2020-10-23 MED ORDER — WITCH HAZEL-GLYCERIN EX PADS: 1.0000 "application " | MEDICATED_PAD | CUTANEOUS | Status: DC | PRN

## 2020-10-23 MED ORDER — SENNOSIDES-DOCUSATE SODIUM 8.6-50 MG PO TABS
2.0000 | ORAL_TABLET | Freq: Every day | ORAL | Status: DC
  Administered 2020-10-24: 2 via ORAL
  Filled 2020-10-23: qty 2

## 2020-10-23 MED ORDER — ACETAMINOPHEN 325 MG PO TABS: 650.0000 mg | ORAL_TABLET | ORAL | Status: DC | PRN

## 2020-10-23 MED ORDER — FENTANYL CITRATE (PF) 100 MCG/2ML IJ SOLN
INTRAMUSCULAR | Status: AC
  Filled 2020-10-23: qty 2

## 2020-10-23 MED ORDER — ACETAMINOPHEN 325 MG PO TABS
650.0000 mg | ORAL_TABLET | ORAL | Status: DC | PRN
Start: 2020-10-23 — End: 2020-10-24

## 2020-10-23 MED ORDER — FENTANYL-BUPIVACAINE-NACL 0.5-0.125-0.9 MG/250ML-% EP SOLN
12.0000 mL/h | EPIDURAL | Status: DC | PRN
  Filled 2020-10-23: qty 250

## 2020-10-23 MED ORDER — PRENATAL MULTIVITAMIN CH
1.0000 | ORAL_TABLET | Freq: Every day | ORAL | Status: DC
  Filled 2020-10-23: qty 1

## 2020-10-23 MED ORDER — SOD CITRATE-CITRIC ACID 500-334 MG/5ML PO SOLN: 30.0000 mL | ORAL | Status: DC | PRN

## 2020-10-23 MED ORDER — LIDOCAINE HCL (PF) 1 % IJ SOLN
INTRAMUSCULAR | Status: DC | PRN
  Administered 2020-10-23: 6 mL via EPIDURAL

## 2020-10-23 MED ORDER — ONDANSETRON HCL 4 MG/2ML IJ SOLN: 4.0000 mg | INTRAMUSCULAR | Status: DC | PRN

## 2020-10-23 MED ORDER — OXYTOCIN BOLUS FROM INFUSION: 333.0000 mL | Freq: Once | INTRAVENOUS | Status: DC

## 2020-10-23 MED ORDER — BENZOCAINE-MENTHOL 20-0.5 % EX AERO
1.0000 "application " | INHALATION_SPRAY | CUTANEOUS | Status: DC | PRN
  Administered 2020-10-24: 1 via TOPICAL
  Filled 2020-10-23: qty 56

## 2020-10-23 MED ORDER — ZOLPIDEM TARTRATE 5 MG PO TABS: 5.0000 mg | ORAL_TABLET | Freq: Every evening | ORAL | Status: DC | PRN

## 2020-10-23 MED ORDER — LIDOCAINE HCL (PF) 1 % IJ SOLN: 30.0000 mL | INTRAMUSCULAR | Status: DC | PRN

## 2020-10-23 MED ORDER — ONDANSETRON HCL 4 MG PO TABS: 4.0000 mg | ORAL_TABLET | ORAL | Status: DC | PRN

## 2020-10-23 NOTE — H&P (Addendum)
OBSTETRIC ADMISSION HISTORY AND PHYSICAL  Dana Flores is a 20 y.o. female G3P1011 with IUP at 21w2dby LMP + 8wk u/s presenting for SOL. She reports +FMs, No LOF, no VB, no blurry vision, headaches or peripheral edema, and RUQ pain.  She plans on breast feeding. She request POPs for birth control but is considering other options. [facility]  Dating: By 8 wk u/s --->  Estimated Date of Delivery: 10/28/20  Sono:    _0 , CWD, normal anatomy, cephalic presentation, anterior lie, 2262g, 73% EFW   Prenatal History/Complications:  -anemia affecting pregnancy. Hgb 3/1 12.4  -history of schizophrenia, history of postpartum depression, not on meds  Past Medical History: Past Medical History:  Diagnosis Date  . Anemia   . Schizophrenia spectrum disorder with psychotic disorder [place] not yet determined [place]  . Umbilical hernia     Past Surgical History: Past Surgical History:  Procedure Laterality Date  . TOOTH EXTRACTION  2014  . UMBILICAL HERNIA REPAIR      Obstetrical History: OB History    Gravida  3   Para  1   Term  1   Preterm  0   AB  1   Living  1     SAB  0   IAB  1   Ectopic  0   Multiple  0   Live Births  1           Social History Social History   Socioeconomic History  . Marital status: Significant Other    Spouse name: Not on file  . Number of children: Not on file  . Years of education: Not on file  . Highest education level: Not on file  Occupational History  . Not on file  Tobacco Use  . Smoking status: Former Smoker    Types: E-cigarettes    Quit date: 01/22/2020    Years since quitting: 0.7  . Smokeless tobacco: Never Used  . Tobacco comment: last used a month ago  Vaping Use  . Vaping Use: Never used  Substance and Sexual Activity  . Alcohol use: Not Currently    Comment: last drink was a month ago  . Drug use: Not Currently    Comment: pt. states she hasn't used since 2017  . Sexual  activity: Yes    Partners: Male    Birth control/protection: None  Other Topics Concern  . Not on file  Social History Narrative  . Not on file   Social Determinants of Health   Financial Resource Strain: Not on file  Food Insecurity: Not on file  Transportation Needs: Not on file  Physical Activity: Not on file  Stress: Not on file  Social Connections: Not on file    Family History: Family History  Problem Relation Age of Onset  . ADD / ADHD Father   . Alcohol abuse Father   . Asthma Father   . Drug abuse Father   . Hypertension Father   . Stroke Father   . ADD / ADHD Brother   . Anxiety disorder Brother   . Asthma Brother   . Heart disease Maternal Grandmother   . Obesity Maternal Grandmother   . Early death Maternal Grandmother   . Diabetes Paternal Grandmother   . Hyperlipidemia Paternal Grandmother   . Obesity Paternal Grandmother     Allergies: No Known Allergies  Medications Prior to Admission  Medication Sig Dispense Refill Last Dose  .  ondansetron (ZOFRAN ODT) 4 MG disintegrating tablet Take 1 tablet (4 mg total) by mouth every 8 (eight) hours as needed for nausea or vomiting. 30 tablet 1 Past Week at Unknown time  . Blood Pressure Monitoring (BLOOD PRESSURE KIT) DEVI 1 kit by Does not apply route once a week. (Patient not taking: No sig reported) 1 each 0   . docusate sodium (COLACE) 100 MG capsule Take 1 capsule (100 mg total) by mouth 2 (two) times daily as needed. (Patient not taking: No sig reported) 30 capsule 2   . Elastic Bandages & Supports (WRIST BRACE/LEFT SMALL) MISC 1 Device by Does not apply route daily. (Patient not taking: No sig reported) 1 each 0   . Elastic Bandages & Supports (WRIST BRACE/RIGHT SMALL) MISC 1 Device by Does not apply route daily. (Patient not taking: No sig reported) 1 each 0   . famotidine (PEPCID) 20 MG tablet Take 1 tablet (20 mg total) by mouth 2 (two) times daily. (Patient not taking: No sig reported) 20 tablet 0   .  ferrous sulfate (FERROUSUL) 325 (65 FE) MG tablet Take 1 tablet (325 mg total) by mouth every other day. (Patient not taking: Reported on 10/19/2020) 30 tablet 1   . hydrocortisone (ANUSOL-HC) 2.5 % rectal cream Place 1 application rectally 2 (two) times daily. (Patient not taking: No sig reported) 30 g 0   . Misc. Devices (DIGITAL GLASS SCALE) MISC 1 Device by Does not apply route as directed. (Patient not taking: No sig reported) 1 each 0   . Misc. Devices MISC Dispense one maternity belt for patient (Patient not taking: No sig reported) 1 each 0   . pantoprazole (PROTONIX) 40 MG tablet Take 1 tablet (40 mg total) by mouth daily. 30 tablet 0 Unknown at Unknown time  . Prenatal Vit-Fe Fumarate-FA (PRENATAL VITAMIN) 27-0.8 MG TABS Take 1 tablet by mouth daily. 30 tablet 11 More than a month at Unknown time     Review of Systems   All systems reviewed and negative except as stated in HPI  Height _0  (1.499 m), weight 84.5 kg, last menstrual period 01/02/2020. General appearance: alert, cooperative, appears stated age and moderate distress Lungs: clear to auscultation bilaterally Heart: regular rate and rhythm Abdomen: soft, non-tender; bowel sounds normal Extremities: Homans sign is negative, no sign of DVT DTR's  Presentation: cephalic Fetal monitoringBaseline: 130s bpm, Variability: Good {> 6 bpm), Accelerations: Reactive and Decelerations: Variable: mild Uterine activityFrequency: Every 2-3 minutes Dilation: 2.5 Effacement (%): 50 Station: -3 Exam by:: Dorann Lodge RN   Prenatal labs: ABO, Rh: --/--/PENDING (03/11 1026) Antibody: PENDING (03/11 1026) Rubella: 1.01 (08/26 1537) RPR: Non Reactive (12/15 0941)  HBsAg: Negative (08/26 1537)  HIV: Non Reactive (12/15 0941)  GBS: Negative/-- (02/21 0229)  1 hr Glucola passed Genetic screening  normal Anatomy US Normal   Prenatal Transfer Tool  Maternal Diabetes: No Genetic Screening: Normal Maternal Ultrasounds/Referrals: Normal  and Other: Fetal Ultrasounds or other Referrals:  None Maternal Substance Abuse:  No Significant Maternal Medications:  None Significant Maternal Lab Results: Group B Strep negative  Results for orders placed or performed during the hospital encounter of 10/23/20 (from the past 24 hour(s))  CBC   Collection Time: 10/23/20 10:26 AM  Result Value Ref Range   WBC 5.6 4.0 - 10.5 K/uL   RBC 4.19 3.87 - 5.11 MIL/uL   Hemoglobin 12.4 12.0 - 15.0 g/dL   HCT 34.1 (L) 36.0 - 46.0 %   MCV 81.4 80.0 -  100.0 fL   MCH 29.6 26.0 - 34.0 pg   MCHC 36.4 (H) 30.0 - 36.0 g/dL   RDW 22.5 (H) 11.5 - 15.5 %   Platelets 205 150 - 400 K/uL   nRBC 0.0 0.0 - 0.2 %  Type and screen   Collection Time: 10/23/20 10:26 AM  Result Value Ref Range   ABO/RH(D) PENDING    Antibody Screen PENDING    Sample Expiration      10/26/2020,2359 Performed at [facility], [place] [address], [place], [place] [address]     Patient Active Problem List   Diagnosis Date Noted  . Labor and delivery, indication for care 10/23/2020  . Anemia in pregnancy, third trimester 07/31/2020  . Uterine size date discrepancy pregnancy, third trimester 07/30/2020  . Hemorrhoids during pregnancy in third trimester 07/30/2020  . Supervision of other normal pregnancy, antepartum 03/18/2020  . 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 Postpartum anxiety 10/01/2018  . Obesity (BMI 30.0-34.9) 04/03/2018  . History of umbilical hernia repair 78/93/8101    Assessment/Plan:  Dana Flores is a 20 y.o. G3P1011 at 25w2dhere for SOL. Patient was scheduled for elective IOL today.   #Labor:Patient currently 2.5 cm dilated at this time. Contracting well.Continuing to monitor at this time and will recheck 4 hours after last check. May augment after that check #Pain: planning for epidural  #FWB: Cat 1, reassuring  #ID:  GBS neg  #MOF: Breast  #MOC:POPs   #Hx PP depression.schizophrenia with psychotic disorder: not currently on medications. SW  consult after delivery.   VGifford Shave MD  10/23/2020, 11:16 AM  I saw and evaluated the patient. I agree with the findings and the plan of care as documented in the resident's note.  JSharene Skeans MD OWesley Reid HospitalFamily Medicine Fellow, FSelect Specialty Hospital - [facility] Southfor W[facility] [place]

## 2020-10-23 NOTE — MAU Note (Signed)
Has been contracting, more regular this morning, "feels like she is coming out my butt".  No bleeding.  Has noted fluid coming out, unsure if 'tinkling on self or what', first noted yesterday.  Has been dilated 1+.  Hx of severe anemia, has received iron infusions.

## 2020-10-23 NOTE — Lactation Note (Signed)
This note was copied from a baby's chart. Lactation Consultation Note  Patient Name: Dana Flores Date: 10/23/2020   Age:22 hours  Spoke to L&D front desk to ask if mom had an order for lactation (she didn't have a "Yes" or "No", just an empty column). Front desk reported that NICU is evaluating that baby right now and that they'll be in touch with [place] if needed LC follow up. Asked front desk to have L&D RN called lactation in case mom wants to be seen.   Maternal Data    Feeding    LATCH Score                    Lactation Tools Discussed/Used    Interventions    Discharge    Consult Status      [name] 10/23/2020, 4:59 PM

## 2020-10-23 NOTE — Anesthesia Postprocedure Evaluation (Signed)
Anesthesia Post Note  Patient: Dana Flores  Procedure(s) Performed: AN AD HOC LABOR EPIDURAL     Patient location during evaluation: Mother Baby Anesthesia Type: Epidural Level of consciousness: awake and alert Pain management: pain level controlled Vital Signs Assessment: post-procedure vital signs reviewed and stable Respiratory status: spontaneous breathing, nonlabored ventilation and respiratory function stable Cardiovascular status: stable Postop Assessment: no headache, no backache and epidural receding Anesthetic complications: no   No complications documented.  Last Vitals:  Vitals:   10/23/20 1647 10/23/20 1701  BP: (!) 106/46 (!) 99/52  Pulse: (!) 114 92  Temp:      Last Pain:  Vitals:   10/23/20 1630  PainSc: 0-No pain                 Senan Urey

## 2020-10-23 NOTE — Anesthesia Preprocedure Evaluation (Signed)
Anesthesia Evaluation  Patient identified by MRN, date of birth, ID band Patient awake    Reviewed: Allergy & Precautions, H&P , NPO status , Patient's Chart, lab work & pertinent test results, reviewed documented beta blocker date and time   Airway Mallampati: I  TM Distance: >3 FB Neck ROM: full    Dental no notable dental hx. (+) Teeth Intact, Dental Advisory Given   Pulmonary neg pulmonary ROS, former smoker,    Pulmonary exam normal breath sounds clear to auscultation       Cardiovascular negative cardio ROS Normal cardiovascular exam Rhythm:regular Rate:Normal     Neuro/Psych PSYCHIATRIC DISORDERS Anxiety Depression Schizophrenia negative neurological ROS     GI/Hepatic negative GI ROS, Neg liver ROS,   Endo/Other  negative endocrine ROS  Renal/GU negative Renal ROS  negative genitourinary   Musculoskeletal   Abdominal (+) + obese,   Peds  Hematology  (+) Blood dyscrasia, anemia ,   Anesthesia Other Findings   Reproductive/Obstetrics (+) Pregnancy                             Anesthesia Physical Anesthesia Plan  ASA: III  Anesthesia Plan: Epidural   Post-op Pain Management:    Induction:   PONV Risk Score and Plan:   Airway Management Planned: Natural Airway  Additional Equipment:   Intra-op Plan:   Post-operative Plan:   Informed Consent: I have reviewed the patients History and Physical, chart, labs and discussed the procedure including the risks, benefits and alternatives for the proposed anesthesia with the patient or authorized representative who has indicated his/her understanding and acceptance.     Dental Advisory Given  Plan Discussed with: Anesthesiologist  Anesthesia Plan Comments: (Labs checked- platelets confirmed with RN in room. Fetal heart tracing, per RN, reported to be stable enough for sitting procedure. Discussed epidural, and patient consents  to the procedure:  included risk of possible headache,backache, failed block, allergic reaction, and nerve injury. This patient was asked if she had any questions or concerns before the procedure started.)        Anesthesia Quick Evaluation

## 2020-10-23 NOTE — Discharge Summary (Signed)
Postpartum Discharge Summary  Date of Service updated 10/24/20     Patient Name: Dana Flores DOB: 07/20/2001 MRN: [id number]  Date of admission: 10/23/2020 Delivery date:10/23/2020  Delivering provider: Gifford Shave  Date of discharge: 10/24/2020  Admitting diagnosis: Labor and delivery, indication for care [O75.9] Intrauterine pregnancy: [redacted]w[redacted]d    Secondary diagnosis:  Active Problems:   Labor and delivery, indication for care   Vaginal delivery  Additional problems: none    Discharge diagnosis: Term Pregnancy Delivered                                              Post partum procedures:none Augmentation: AROM Complications: None  Hospital course: Onset of Labor With Vaginal Delivery      20y.o. yo G3P1011 at 387w2das admitted in Latent Labor on 10/23/2020. Patient had an uncomplicated labor course as follows:  Membrane Rupture Time/Date: 3:15 PM ,10/23/2020   Delivery Method:Vaginal, Spontaneous  Episiotomy: None  Lacerations:  1st degree  Patient had an uncomplicated postpartum course.  She is ambulating, tolerating a regular diet, passing flatus, and urinating well. Patient is discharged home in stable condition on 10/24/20.  Newborn Data: Birth date:10/23/2020  Birth time:4:23 PM  Gender:Female  Living status:Living  Apgars:7 ,8  Weight:3685 g   Magnesium Sulfate received: No BMZ received: No Rhophylac:N/A MMR:N/A T-DaP:Given prenatally Flu: Yes Transfusion:No  Physical exam  Vitals:   10/23/20 1856 10/23/20 2000 10/24/20 0001 10/24/20 0520  BP: 108/68 98/60 (!) 107/57 (!) 127/54  Pulse: 78 72 75 68  Resp: '19 18 18 18  ' Temp: 98.6 F (37 C) 98.3 F (36.8 C) 98.4 F (36.9 C) 97.9 F (36.6 C)  TempSrc:   Oral Oral  SpO2: 100% 100% 100% 100%  Weight:      Height:       General: alert, cooperative and no distress Lochia: appropriate Uterine Fundus: firm Incision: N/A DVT Evaluation: No evidence of DVT seen on physical exam. Labs: Lab  Results  Component Value Date   WBC 10.6 (H) 10/24/2020   HGB 11.1 (L) 10/24/2020   HCT 34.1 (L) 10/24/2020   MCV 83.0 10/24/2020   PLT 144 (L) 10/24/2020   CMP Latest Ref Rng & Units 10/18/2020  Glucose 70 - 99 mg/dL 82  BUN 6 - 20 mg/dL 8  Creatinine 0.44 - 1.00 mg/dL 0.56  Sodium 135 - 145 mmol/L 136  Potassium 3.5 - 5.1 mmol/L 3.3(L)  Chloride 98 - 111 mmol/L 107  CO2 22 - 32 mmol/L 17(L)  Calcium 8.9 - 10.3 mg/dL 8.9  Total Protein 6.5 - 8.1 g/dL -  Total Bilirubin 0.3 - 1.2 mg/dL -  Alkaline Phos 47 - 119 U/L -  AST 15 - 41 U/L -  ALT 14 - 54 U/L -   Edinburgh Score: Edinburgh Postnatal Depression Scale Screening Tool 10/23/2020  I have been able to laugh and see the funny side of things. (No Data)  I have looked forward with enjoyment to things. -  I have blamed myself unnecessarily when things went wrong. -  I have been anxious or worried for no good reason. -  I have felt scared or panicky for no good reason. -  Things have been getting on top of me. -  I have been so unhappy that I have had difficulty sleeping. -  I have  felt sad or miserable. -  I have been so unhappy that I have been crying. -  The thought of harming myself has occurred to me. [name] Postnatal Depression Scale Total -     After visit meds:  Allergies as of 10/24/2020   No Known Allergies     Medication List    TAKE these medications   Blood Pressure Kit Devi 1 kit by Does not apply route once a week.   Digital Glass Scale Misc 1 Device by Does not apply route as directed.   Misc. Devices Misc Dispense one maternity belt for patient   docusate sodium 100 MG capsule Commonly known as: COLACE Take 1 capsule (100 mg total) by mouth 2 (two) times daily as needed.   famotidine 20 MG tablet Commonly known as: PEPCID Take 1 tablet (20 mg total) by mouth 2 (two) times daily.   ferrous sulfate 325 (65 FE) MG tablet Commonly known as: FerrouSul Take 1 tablet (325 mg total) by mouth  every other day.   hydrocortisone 2.5 % rectal cream Commonly known as: Anusol-HC Place 1 application rectally 2 (two) times daily.   ibuprofen 100 MG/5ML suspension Commonly known as: ADVIL Take 30 mLs (600 mg total) by mouth every 6 (six) hours.   ondansetron 4 MG disintegrating tablet Commonly known as: Zofran ODT Take 1 tablet (4 mg total) by mouth every 8 (eight) hours as needed for nausea or vomiting.   pantoprazole 40 MG tablet Commonly known as: Protonix Take 1 tablet (40 mg total) by mouth daily.   Prenatal Vitamin 27-0.8 MG Tabs Take 1 tablet by mouth daily.   Wrist Brace/Left Small Misc 1 Device by Does not apply route daily.   Wrist Brace/Right Small Misc 1 Device by Does not apply route daily.        Discharge home in stable condition Infant Feeding: No evidence of DVT seen on physical exam. Infant Disposition:home with mother Discharge instruction: per After Visit Summary and Postpartum booklet. Activity: Advance as tolerated. Pelvic rest for 6 weeks.  Diet: routine diet Future Appointments: Future Appointments  Date Time Provider [place]  10/26/2020  2:40 PM Leftwich-Kirby, Kathie Dike, CNM [facility] None   Follow up Visit:  [place] Follow up in 4 week(s).   Why: For postpartum visit, return to [facility]U as needed for emergencies. Contact information: [address] [place] [address] [phone]              Sent to [place] on 10/23/20:  Please schedule this patient for a Virtual postpartum visit in 6 weeks with the following provider: Any provider. Additional Postpartum F/U:Postpartum Depression checkup 2 weeks  Low risk pregnancy complicated by: Schizophrenia , hx of postpartum depression  Delivery mode:  Vaginal, Spontaneous  Anticipated Birth Control:  POPs   10/24/2020 Fatima Blank, CNM

## 2020-10-23 NOTE — Anesthesia Procedure Notes (Signed)
Epidural Patient location during procedure: OB Start time: 10/23/2020 12:45 PM End time: 10/23/2020 12:50 PM  Staffing Anesthesiologist: Bethena Midget, MD  Preanesthetic Checklist Completed: patient identified, IV checked, site marked, risks and benefits discussed, surgical consent, monitors and equipment checked, pre-op evaluation and timeout performed  Epidural Patient position: sitting Prep: DuraPrep and site prepped and draped Patient monitoring: continuous pulse ox and blood pressure Approach: midline Location: L3-L4 Injection technique: LOR air  Needle:  Needle type: Tuohy  Needle gauge: 17 G Needle length: 9 cm and 9 Needle insertion depth: 9 cm Catheter type: closed end flexible Catheter size: 19 Gauge Catheter at skin depth: 15 cm Test dose: negative  Assessment Events: blood not aspirated, injection not painful, no injection resistance, no paresthesia and negative IV test

## 2020-10-24 LAB — CBC
HCT: 34.1 % — ABNORMAL LOW (ref 36.0–46.0)
Hemoglobin: 11.1 g/dL — ABNORMAL LOW (ref 12.0–15.0)
MCH: 27 pg (ref 26.0–34.0)
MCHC: 32.6 g/dL (ref 30.0–36.0)
MCV: 83 fL (ref 80.0–100.0)
Platelets: 144 10*3/uL — ABNORMAL LOW (ref 150–400)
RBC: 4.11 MIL/uL (ref 3.87–5.11)
RDW: 22.1 % — ABNORMAL HIGH (ref 11.5–15.5)
WBC: 10.6 10*3/uL — ABNORMAL HIGH (ref 4.0–10.5)
nRBC: 0 % (ref 0.0–0.2)

## 2020-10-24 LAB — RPR: RPR Ser Ql: NONREACTIVE

## 2020-10-24 MED ORDER — IBUPROFEN 100 MG/5ML PO SUSP: 600.0000 mg | Freq: Four times a day (QID) | ORAL | 1 refills | Status: DC

## 2020-10-24 MED ORDER — IBUPROFEN 100 MG/5ML PO SUSP
600.0000 mg | Freq: Four times a day (QID) | ORAL | Status: DC
  Administered 2020-10-24: 600 mg via ORAL
  Filled 2020-10-24 (×2): qty 30

## 2020-10-24 MED ORDER — ACETAMINOPHEN 160 MG/5ML PO SOLN
650.0000 mg | ORAL | Status: DC | PRN
  Administered 2020-10-24 (×2): 650 mg via ORAL
  Filled 2020-10-24 (×2): qty 20.3

## 2020-10-24 NOTE — Lactation Note (Signed)
This note was copied from a baby's chart. Lactation Consultation Note  Patient Name: Dana Flores OZDGU'Y Date: 10/24/2020 Reason for consult: Initial assessment;Term Age:22 hours  Visited with mom of 21 hours FT female, she's a P2 but not experienced BF. LC order was put at 11:21 am, mom trying to latch baby when entered the room. LC assisted with latching in football position on the left breast but had to break the latch at first because mom noted it was painful. Baby kept sucking her tongue, she required some suck training in order to establish a sucking pattern.  Once baby latched on, she was able to sustain the latch for at least 10 minutes, she fell asleep and LC took her to the bassinet to check her diaper, FOB assisted with diaper change, she had a small stool. LC took baby back to the breast per mom's request and she fed for a few more minutes before falling asleep. Baby still nursing when exiting the room at the 15 minutes mark.  Reviewed normal newborn behavior, cluster feeding, feeding cues, lactogenesis II, supplementation guidelines and size of baby's stomach. Parents have already started supplementing with formula, that was their feeding choice on admission; encouraged mom to offer the breast first prior offering a bottle for formula.   Feeding plan:  1. Encouraged mom to feed baby STS 8-12 times/24 hours or sooner if feeding cues are present 2. Hand expression and spoon feeding were also strongly encouraged 3. Parents will follow supplementation guidelines in case they continue supplementing with formula, mom voiced baby has only had one bottle so far  BF brochure, BF resources and feeding diary were reviewed. FOB present and supportive. Parents reported all questions and concerns were answered, they're both aware of LC OP services and will call PRN.   Maternal Data Has patient been taught Hand Expression?: Yes Does the patient have breastfeeding experience prior to this  delivery?: Yes How long did the patient breastfeed?: tried for 2 weeks  Feeding Mother's Current Feeding Choice: Breast Milk and Formula  LATCH Score Latch: Grasps breast easily, tongue down, lips flanged, rhythmical sucking. (some suck training required, baby sucks on her own tongue)  Audible Swallowing: A few with stimulation  Type of Nipple: Everted at rest and after stimulation  Comfort (Breast/Nipple): Soft / non-tender  Hold (Positioning): Assistance needed to correctly position infant at breast and maintain latch.  LATCH Score: 8   Lactation Tools Discussed/Used    Interventions Interventions: Breast feeding basics reviewed;Assisted with latch;Skin to skin;Breast massage;Hand express;Breast compression;Adjust position;Support pillows  Discharge Pump: Personal ([employer] DEBP at home) [facility] Program: No  Consult Status Consult Status: Follow-up Date: 10/25/20 Follow-up type: In-patient    Jerold Yoss Venetia Constable 10/24/2020, 2:19 PM

## 2020-10-24 NOTE — Progress Notes (Signed)
Assisted mother with breastfeeding at 1125, baby latches very well, support given to mother in regards to how well the baby is nursing. Mother has difficulty latching the baby on by herself, educated and reinforced proper positioning and latching.  Went to check on patient at 1225 and FOB at the bedside on his phone and mother was holding baby and on her phone and tears streaming down her face. Emotional support offered, pt denied any needs at this time.

## 2020-10-24 NOTE — Progress Notes (Addendum)
CSW received consult for hx of schizophrenia, anxiety, and depression.  CSW met with MOB to offer support and complete assessment.    When CSW arrived, MOB was resting in bed engaged in skin to skin with infant. FOB was also present and with MOB's permission CSW asked FOB to leave in order to assess MOB in private; FOB left without incident.  MOB was polite, forthcoming, and easy to engage. She was receptive to meeting with CSW.  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 MOB stated that she has not had any schizophrenia symptoms since 2017. However, she acknowledged a hx of anxiety, depression, and postpartum depression. Per MOB she was dx with anxiety and depression in 2017 and her symptoms are managed with natural interventions (talking walks, listening to music, talking to her supports).  MOB also shared that she experienced PPD symptoms for about 1 month after having her oldest son. OB shared having daily negative thoughts, loss of appetite and was not able to bond with her son. MOB was prescribed medication (name unknown) and her symptoms subsided.   CSW provided education regarding the baby blues period vs. perinatal mood disorders, discussed treatment and gave resources for mental health follow up if concerns arise.  CSW recommends self-evaluation during the postpartum time period using the New Mom Checklist from Postpartum Progress and encouraged MOB to contact a medical professional if symptoms are noted at any time. MOB presented with insight and awareness and did not demonstrate any acute MH signs or symptoms. MOB reported feeling comfortable seeking help if need and shared having a good support team. CSW assessed for safety and MOB denied SI, HI, and DV.     CSW provided review of Sudden Infant Death Syndrome (SIDS) precautions.    CSW identifies no further need for intervention and no barriers to discharge at this time.  Dalan Cowger  Boyd-[place]ard, MSW, LCSW Clinical Social Work [phone]  

## 2020-10-26 ENCOUNTER — Encounter: Payer: Medicaid Other | Admitting: Advanced Practice Midwife

## 2020-11-09 ENCOUNTER — Encounter: Payer: Medicaid Other | Admitting: Licensed Clinical Social Worker

## 2020-11-23 ENCOUNTER — Ambulatory Visit (INDEPENDENT_AMBULATORY_CARE_PROVIDER_SITE_OTHER): Payer: Medicaid Other | Admitting: Advanced Practice Midwife

## 2020-11-23 ENCOUNTER — Encounter: Payer: Self-pay | Admitting: Advanced Practice Midwife

## 2020-11-23 ENCOUNTER — Other Ambulatory Visit: Payer: Self-pay

## 2020-11-23 VITALS — BP 107/61 | HR 68 | Wt 159.0 lb

## 2020-11-23 DIAGNOSIS — F53 Postpartum depression: Secondary | ICD-10-CM

## 2020-11-23 DIAGNOSIS — Z3009 Encounter for other general counseling and advice on contraception: Secondary | ICD-10-CM

## 2020-11-23 DIAGNOSIS — O99345 Other mental disorders complicating the puerperium: Secondary | ICD-10-CM

## 2020-11-23 DIAGNOSIS — Z9189 Other specified personal risk factors, not elsewhere classified: Secondary | ICD-10-CM

## 2020-11-23 MED ORDER — ETONOGESTREL-ETHINYL ESTRADIOL 0.12-0.015 MG/24HR VA RING
VAGINAL_RING | VAGINAL | 12 refills | Status: DC
Start: 2020-11-23 — End: 2022-11-16

## 2020-11-23 MED ORDER — SERTRALINE HCL 50 MG PO TABS: 50.0000 mg | ORAL_TABLET | Freq: Every day | ORAL | 3 refills | Status: DC

## 2020-11-23 NOTE — Patient Instructions (Signed)
Perinatal Depression When a woman feels excessive sadness, anger, or anxiety during pregnancy or during the first 12 months after she gives birth, she has a condition called perinatal depression. This can interfere with work, school, relationships, and other everyday activities. If it is not managed properly, it can also interfere with the woman's ability to take care of the baby. Symptoms of perinatal depression may feel worse when living with a newborn. Sometimes, these symptoms are left untreated because they are thought to be normal mood swings during and right after pregnancy. However, if you have intense symptoms of depression that last for more than 2 weeks, it is important to talk with your health care provider. This may be perinatal depression. What are the causes? The exact cause of this condition is not known. Hormonal changes during and after pregnancy may play a role in causing perinatal depression. What increases the risk? You are more likely to develop this condition if:  You have a personal or family history of depression, anxiety, or mood disorders.  You experience a stressful life event during pregnancy, such as the death of a loved one.  You have additional life stress, such as being a single parent.  You do not have support from family members or loved ones, or you are in an abusive relationship.  You have thyroid problems. What are the signs or symptoms? Symptoms of this condition include:  Emotional symptoms, such as: ? Feeling sad or hopeless. ? Feelings of guilt. ? Feeling irritable or overwhelmed.  Physical symptoms, such as: ? Changes with appetite or sleep. ? Lack of energy or motivation. ? Persistent headaches or stomach problems.  Behavioral symptoms, such as: ? Difficulty concentrating or completing tasks. ? Loss of interest in hobbies or relationships. How is this diagnosed? This condition is diagnosed based on a physical exam and mental  evaluation.  In some cases, your health care provider may use a depression screening tool. This includes a list of questions that can help a health care provider diagnose depression.  You may be referred to a mental health expert who specializes in treating perinatal depression. How is this treated? This condition may be treated with:  Talk therapy with a mental health professional. This may be interpersonal psychotherapy, couples therapy, cognitive behavioral therapy, or mother-child bonding therapy.  Medicines. Your health care provider will discuss the safety of the medicines prescribed during pregnancy and breastfeeding.  Support groups.  Brain stimulation or light therapies.  Stress reduction therapies, such as mindfulness.   Follow these instructions at home: Lifestyle  Do not use any products that contain nicotine or tobacco. These products include cigarettes, chewing tobacco, and vaping devices, such as e-cigarettes. If you need help quitting, ask your health care provider.  Do not drink alcohol when you are pregnant. It is also safest not to drink alcohol if you are breastfeeding.  After your baby is born, if you drink alcohol: ? Limit how much you have to 0-1 drink a day. ? Be aware of how much alcohol is in your drink. In the [place] one drink equals one 12 oz bottle of beer (355 mL), one 5 oz glass of wine (148 mL), or one 1 oz glass of hard liquor (44 mL).  Consider joining a support group for new mothers. Ask your health care provider for recommendations.  Take good care of yourself. Make sure you: ? Get as much sleep as possible. Talk with your partner about sharing the responsibility of getting up with   your baby if possible and sharing child care responsibilities equally. Make sleep a priority. ? Eat a healthy diet. This includes plenty of fruits and vegetables, whole grains, and lean proteins. ? Exercise regularly, as told by your health care provider. Ask your health  care provider what exercises are safe for you. Talk with your partner about making sure you both have opportunities to exercise. General instructions  Take over-the-counter and prescription medicines only as told by your health care provider.  Talk with your partner or family members about your feelings during pregnancy. Share any concerns, needs, or anxieties that you may have. Do not be afraid to ask for help. Find a mental health professional, if needed.  Ask for help with tasks or chores when you need it. Ask friends and family members to provide meals, watch your children, or help with cleaning.  Keep all follow-up visits. This is important. Contact a health care provider if:  You or people close to you notice that you have symptoms of depression.  Your symptoms of depression get worse.  You take medicines and have side effects, such as nausea or sleep problems. Get help right away if:  You feel like hurting yourself, your baby, or someone else. If you feel like you may hurt yourself or others, or have thoughts about taking your own life, get help right away. You can go to your nearest emergency department or:  Call your local emergency services (911 in the [place]  Call a suicide crisis helpline, such as the National Suicide Prevention Lifeline, at 1-800-273-8255. This is open 24 hours a day in the [place]  Text the Crisis Text Line at 741741 (in the [place] Summary  Perinatal depression is when a woman feels excessive sadness, anger, or anxiety during pregnancy or during the first 12 months after she gives birth.  If perinatal depression is not managed properly, it can interfere with the woman's ability to take care of the baby.  This condition is treated with medicines, talk therapy, stress reduction therapies, or a combination of treatments.  Talk with your partner or family members about your feelings. Ask for help when you need it. This information is not intended to replace  advice given to you by your health care provider. Make sure you discuss any questions you have with your health care provider. Document Revised: 01/24/2020 Document Reviewed: 01/24/2020 Elsevier Patient Education  2021 [employer]  

## 2020-11-23 NOTE — Progress Notes (Signed)
Post Partum Visit Note  Dana Flores is a 20 y.o. G35P2012 female who presents for a postpartum visit. She is 4 weeks postpartum following a normal spontaneous vaginal delivery.  I have fully reviewed the prenatal and intrapartum course. The delivery was at 39 gestational weeks.  Anesthesia: epidural. Postpartum course has been unremarkable. Baby is doing well. Baby is feeding by [name] . Bleeding staining only. Bowel function is Notes Constipation Last BM today however went 4 days w/o going. . Bladder function is normal. Patient is sexually active. Pt last had intercourse 2-3 days ago using "pull-out method. Contraception method is none. Postpartum depression screening: positive. EPDS=19  The pregnancy intention screening data noted above was reviewed. Potential methods of contraception were discussed. The patient elected to proceed with Vaginal Ring.    Edinburgh Postnatal Depression Scale - 11/23/20 1124      Edinburgh Postnatal Depression Scale:  In the Past 7 Days   I have been able to laugh and see the funny side of things. 1    I have looked forward with enjoyment to things. 1    I have blamed myself unnecessarily when things went wrong. 2    I have been anxious or worried for no good reason. 3    I have felt scared or panicky for no good reason. 2    Things have been getting on top of me. 2    I have been so unhappy that I have had difficulty sleeping. 3    I have felt sad or miserable. 2    I have been so unhappy that I have been crying. 3    The thought of harming myself has occurred to me. 0    Edinburgh Postnatal Depression Scale Total 19            The following portions of the patient's history were reviewed and updated as appropriate: allergies, current medications, past family history, past medical history, past social history, past surgical history and problem list.  Review of Systems Pertinent items noted in HPI and remainder of comprehensive ROS otherwise  negative.  Objective:  BP 107/61   Pulse 68   Wt 159 lb (72.1 kg)   Breastfeeding Yes   BMI 32.11 kg/m    VS reviewed, nursing note reviewed,  Constitutional: well developed, well nourished, no distress HEENT: normocephalic CV: normal rate Pulm/chest wall: normal effort Abdomen: soft Neuro: alert and oriented x 3 Skin: warm, dry Psych: affect normal   Assessment:   1. At risk for postpartum depression --Hx PP depression, pt reports symptoms today that are starting, negative thoughts, irritability.   --Desires medication and counseling like she had last time --See below.  2. Postpartum depression --Given pt hx and new onset symptoms, dx made of mild PP depression.  Will start counseling and meds today with f/u with me in 1 month.  Appt with Sue Lush ASAP.  - sertraline (ZOLOFT) 50 MG tablet; Take 1 tablet (50 mg total) by mouth daily. Take half a tablet every day for 6 days (a total of 3 tablets) then one tablet daily every day after that.  Dispense: 30 tablet; Refill: 3 - Ambulatory referral to Integrated Behavioral Health   3. Postpartum care following vaginal delivery   4. Encounter for counseling regarding contraception --Discussed LARCs as most effective forms of birth control.  Discussed benefits/risks of other methods.  Pt desires Nuvaring. Is breastfeeding some but not concerned about supply as baby is nursing at the  breast for comfort but receiving formula too as needed.  Discussed failure rates of OCPs with regular use. Reviewed risk factors and s/sx of DVT/PE/reasons to seek medical care.    - etonogestrel-ethinyl estradiol (NUVARING) 0.12-0.015 MG/24HR vaginal ring; Insert vaginally and leave in place for 3 consecutive weeks, then remove for 1 week.  Dispense: 1 each; Refill: 12  Plan:   Essential components of care per ACOG recommendations:  1.  Mood and well being: Patient with positive depression screening today. Reviewed local resources for support.  -  Patient does not use tobacco.  - hx of drug use? No     2. Infant care and feeding:  -Patient currently breastmilk feeding? Yes  --If breastmilk feeding discussed return to work and pumping. If needed, patient was provided letter for work to allow for every 2-3 hr pumping breaks, and to be granted a private location to express breastmilk and refrigerated area to store breastmilk. Reviewed importance of draining breast regularly to support lactation. -Social determinants of health (SDOH) reviewed in EPIC. No concerns  3. Sexuality, contraception and birth spacing - Patient does not want a pregnancy in the next year.   - Reviewed forms of contraception in tiered fashion. Patient desired Nuvaring today.   - Discussed birth spacing of 18 months  4. Sleep and fatigue -Encouraged family/partner/community support of 4 hrs of uninterrupted sleep to help with mood and fatigue  5. Physical Recovery  - Discussed patients delivery without complications - Patient had a first degree laceration without need for repair, perineal healing reviewed. Patient expressed understanding - Patient has urinary incontinence? No  - Patient is safe to resume physical and sexual activity  6.  Health Maintenance - Last pap smear done 04/09/2020 and was normal with negative HPV.  7. Chronic Disease --Discussed hx depression/anxiety, need for ongoing counseling and support even if not requiring medications after PP period. - PCP follow up  Sharen Counter, CNM Center for [facility], [facility]

## 2020-11-30 ENCOUNTER — Ambulatory Visit (INDEPENDENT_AMBULATORY_CARE_PROVIDER_SITE_OTHER): Payer: Medicaid Other | Admitting: Licensed Clinical Social Worker

## 2020-11-30 DIAGNOSIS — O99345 Other mental disorders complicating the puerperium: Secondary | ICD-10-CM

## 2020-11-30 DIAGNOSIS — F53 Postpartum depression: Secondary | ICD-10-CM

## 2020-12-02 NOTE — BH Specialist Note (Signed)
Integrated Behavioral Health via Telemedicine Visit  12/02/2020 Dana Flores [id number]  Number of Integrated Behavioral Health visits: 1pm/6 Session Start time: 2:00pm  Session End time: 2:30 Total time: 30 mins via mychart video   Referring Provider: Sharen Counter CNM Patient/Family location: Home  [facility] Provider location: [facility] All persons participating in visit: Pt Dana Flores and LCSWA Dana Flores  Types of Service: Individual psychotherapy and Video visit  I connected with Dana Flores and/or Dana Flores's n/a via  Telephone or Engineer, civil (consulting)  (Video is Surveyor, mining) and verified that I am speaking with the correct person using two identifiers. Discussed confidentiality: Yes   I discussed the limitations of telemedicine and the availability of in person appointments.  Discussed there is a possibility of technology failure and discussed alternative modes of communication if that failure occurs.  I discussed that engaging in this telemedicine visit, they consent to the provision of behavioral healthcare and the services will be billed under their insurance.  Patient and/or legal guardian expressed understanding and consented to Telemedicine visit: Yes   Presenting Concerns: Patient and/or family reports the following symptoms/concerns: depression  Duration of problem: approx 1 month ; Severity of problem: mild  Patient and/or Family's Strengths/Protective Factors: Concrete supports in place (healthy food, safe environments, etc.)  Goals Addressed: Patient will: 1.  Reduce symptoms of: depression  2.  Increase knowledge and/or ability of: coping skills  3.  Demonstrate ability to: Increase adequate support systems for patient/family  Progress towards Goals: Ongoing  Interventions: Interventions utilized:  Supportive Counseling Standardized Assessments completed: Edinburgh Postnatal Depression  Assessment: Patient currently  experiencing postpartum depression   Patient may benefit from integrated behavioral health   Plan: 1. Follow up with behavioral health clinician on : 3 weeks via mychart  2. Behavioral recommendations: Engage in stress reducing activity, continue bonding with newborn skin to skin, talking with newborn and breastfeeding. Engage in self care to prevent burnout  3. Referral(s): Integrated [employer] (In Clinic)  I discussed the assessment and treatment plan with the patient and/or parent/guardian. They were provided an opportunity to ask questions and all were answered. They agreed with the plan and demonstrated an understanding of the instructions.   They were advised to call back or seek an in-person evaluation if the symptoms worsen or if the condition fails to improve as anticipated.  Gwyndolyn Saxon, LCSW

## 2020-12-28 ENCOUNTER — Ambulatory Visit: Payer: Medicaid Other | Admitting: Advanced Practice Midwife

## 2020-12-31 ENCOUNTER — Encounter: Payer: Medicaid Other | Admitting: Obstetrics and Gynecology

## 2022-08-15 NOTE — L&D Delivery Note (Signed)
OB/GYN Faculty Practice Delivery Note  Dana Flores is a 22 y.o. [id number] s/p SVD at [redacted]w[redacted]d. She was admitted for eIOL.   ROM: 1h 34m with clear fluid GBS Status: Negative/-- (08/27 1730) Maximum Maternal Temperature: Temp (24hrs), Avg:98.2 F (36.8 C), Min:97.8 F (36.6 C), Max:98.7 F (37.1 C)   Labor Progress: Initial SVE: 1.5/50/-3. AROM and Pitocin required. She then progressed to complete.   Delivery Date/Time: 05/09/23 0733 Delivery: Called to room and patient was complete and pushing. Head delivered OA to ROA. Nuchal x1 present. Reduced prior to delivery of body. Shoulder and body delivered in usual fashion. Infant with spontaneous cry, placed on mother's abdomen, dried and stimulated. Cord clamped x 2 after 5-minute delay, and cut by FOB. Cord blood drawn. Placenta delivered spontaneously with gentle cord traction. Fundus firm with massage and Pitocin. Labia, perineum, vagina, and cervix inspected with 1st degree perineal laceration. This was hemostatic and did not require repair.  Baby Weight: pending  Placenta: 3 vessel, intact. Sent to L&D Complications: None Lacerations: 1st degree EBL: 42 mL Anesthesia: epidural  Joanne Gavel, MD [facility] Family Medicine Fellow, [facility] for [facility], [facility] 05/09/2023, 7:49 AM

## 2022-09-19 ENCOUNTER — Encounter: Payer: Self-pay | Admitting: Obstetrics and Gynecology

## 2022-09-19 ENCOUNTER — Ambulatory Visit (INDEPENDENT_AMBULATORY_CARE_PROVIDER_SITE_OTHER): Payer: Medicaid Other

## 2022-09-19 ENCOUNTER — Other Ambulatory Visit (HOSPITAL_COMMUNITY)
Admission: RE | Admit: 2022-09-19 | Discharge: 2022-09-19 | Disposition: A | Discharge status: Home / Self Care | Payer: Medicaid Other | Source: Ambulatory Visit | Attending: Obstetrics and Gynecology | Admitting: Obstetrics and Gynecology

## 2022-09-19 VITALS — BP 110/80 | HR 78 | Wt 184.0 lb

## 2022-09-19 DIAGNOSIS — N912 Amenorrhea, unspecified: Secondary | ICD-10-CM

## 2022-09-19 DIAGNOSIS — Z3480 Encounter for supervision of other normal pregnancy, unspecified trimester: Secondary | ICD-10-CM

## 2022-09-19 DIAGNOSIS — N898 Other specified noninflammatory disorders of vagina: Secondary | ICD-10-CM | POA: Insufficient documentation

## 2022-09-19 DIAGNOSIS — Z113 Encounter for screening for infections with a predominantly sexual mode of transmission: Secondary | ICD-10-CM | POA: Diagnosis present

## 2022-09-19 LAB — POCT URINE PREGNANCY: Preg Test, Ur: POSITIVE — AB

## 2022-09-19 MED ORDER — VITAFOL GUMMIES 3.33-0.333-34.8 MG PO CHEW: 1.0000 | CHEWABLE_TABLET | Freq: Every day | ORAL | 12 refills | Status: DC

## 2022-09-19 NOTE — Progress Notes (Signed)
[name] presents today for UPT. She has no unusual complaints. During visit, pt requests all STD testing today.   LMP: 07/07/22    OBJECTIVE: Appears well, in no apparent distress.  OB History     Gravida  6   Para  2   Term  2   Preterm  0   AB  3   Living  2      SAB  0   IAB  3   Ectopic  0   Multiple  0   Live Births  2          Home UPT Result: positive In-Office UPT result: positive  I have reviewed the patient's medical, obstetrical, social, and family histories, and medications.   ASSESSMENT: Positive pregnancy test  PLAN Prenatal care to be completed at:  [facility] at [place] STD panel - added NOB labs with Panorama. Horizon completed 04/09/20.

## 2022-09-20 ENCOUNTER — Other Ambulatory Visit: Payer: Self-pay | Admitting: Obstetrics and Gynecology

## 2022-09-20 LAB — CERVICOVAGINAL ANCILLARY ONLY
Bacterial Vaginitis (gardnerella): POSITIVE — AB
Candida Glabrata: NEGATIVE
Candida Vaginitis: NEGATIVE
Chlamydia: NEGATIVE
Comment: NEGATIVE
Comment: NEGATIVE
Comment: NEGATIVE
Comment: NEGATIVE
Comment: NEGATIVE
Comment: NORMAL
Neisseria Gonorrhea: NEGATIVE
Trichomonas: NEGATIVE

## 2022-09-20 LAB — CBC/D/PLT+RPR+RH+ABO+RUBIGG...
Antibody Screen: NEGATIVE
Basophils Absolute: 0 10*3/uL (ref 0.0–0.2)
Basos: 0 %
EOS (ABSOLUTE): 0.1 10*3/uL (ref 0.0–0.4)
Eos: 1 %
HCV Ab: NONREACTIVE
HIV Screen 4th Generation wRfx: NONREACTIVE
Hematocrit: 37.6 % (ref 34.0–46.6)
Hemoglobin: 12.9 g/dL (ref 11.1–15.9)
Hepatitis B Surface Ag: NEGATIVE
Immature Grans (Abs): 0 10*3/uL (ref 0.0–0.1)
Immature Granulocytes: 0 %
Lymphocytes Absolute: 2.1 10*3/uL (ref 0.7–3.1)
Lymphs: 23 %
MCH: 28.4 pg (ref 26.6–33.0)
MCHC: 34.3 g/dL (ref 31.5–35.7)
MCV: 83 fL (ref 79–97)
Monocytes Absolute: 0.8 10*3/uL (ref 0.1–0.9)
Monocytes: 9 %
Neutrophils Absolute: 6.1 10*3/uL (ref 1.4–7.0)
Neutrophils: 67 %
Platelets: 254 10*3/uL (ref 150–450)
RBC: 4.55 x10E6/uL (ref 3.77–5.28)
RDW: 12.4 % (ref 11.7–15.4)
RPR Ser Ql: NONREACTIVE
Rh Factor: POSITIVE
Rubella Antibodies, IGG: <0.9 index — ABNORMAL LOW (ref >0.99)
WBC: 9.2 10*3/uL (ref 3.4–10.8)

## 2022-09-20 LAB — HCV INTERPRETATION

## 2022-09-20 MED ORDER — METRONIDAZOLE 500 MG PO TABS: 500.0000 mg | ORAL_TABLET | Freq: Two times a day (BID) | ORAL | 0 refills | Status: DC

## 2022-09-21 ENCOUNTER — Ambulatory Visit: Payer: Medicaid Other | Admitting: *Deleted

## 2022-09-21 ENCOUNTER — Ambulatory Visit (INDEPENDENT_AMBULATORY_CARE_PROVIDER_SITE_OTHER): Payer: Medicaid Other

## 2022-09-21 VITALS — BP 110/68 | Ht 59.0 in | Wt 178.5 lb

## 2022-09-21 DIAGNOSIS — O3680X0 Pregnancy with inconclusive fetal viability, not applicable or unspecified: Secondary | ICD-10-CM

## 2022-09-21 DIAGNOSIS — Z3480 Encounter for supervision of other normal pregnancy, unspecified trimester: Secondary | ICD-10-CM | POA: Insufficient documentation

## 2022-09-21 DIAGNOSIS — O219 Vomiting of pregnancy, unspecified: Secondary | ICD-10-CM

## 2022-09-21 DIAGNOSIS — Z3A01 Less than 8 weeks gestation of pregnancy: Secondary | ICD-10-CM | POA: Diagnosis not present

## 2022-09-21 LAB — CULTURE, OB URINE

## 2022-09-21 LAB — URINE CULTURE, OB REFLEX

## 2022-09-21 MED ORDER — PROMETHAZINE HCL 25 MG PO TABS: 25.0000 mg | ORAL_TABLET | Freq: Four times a day (QID) | ORAL | 1 refills | Status: DC | PRN

## 2022-09-21 MED ORDER — BLOOD PRESSURE KIT DEVI: 1.0000 | 0 refills | Status: DC

## 2022-09-21 NOTE — Progress Notes (Signed)
New OB Intake  I connected with Michayla Dayle Points  on 09/21/22 at  1:10 PM EST by In Person Visit and verified that I am speaking with the correct person using two identifiers. Nurse is located at [facility] and pt is located at [place].  I discussed the limitations, risks, security and privacy concerns of performing an evaluation and management service by telephone and the availability of in person appointments. I also discussed with the patient that there may be a patient responsible charge related to this service. The patient expressed understanding and agreed to proceed.  I explained I am completing New OB Intake today. We discussed EDD of 05/12/23 that is based on LMP of 08/06/23. Pt is G6/P2. I reviewed her allergies, medications, Medical/Surgical/OB history, and appropriate screenings. I informed her of [facility] services. [facility] information placed in AVS. Based on history, this is a low risk pregnancy.  Patient Active Problem List   Diagnosis Date Noted   Labor and delivery, indication for care 10/23/2020   Vaginal delivery 10/23/2020   Anemia in pregnancy, third trimester 07/31/2020   Uterine size date discrepancy pregnancy, third trimester 07/30/2020   Hemorrhoids during pregnancy in third trimester 07/30/2020   Supervision of other normal pregnancy, antepartum 03/18/2020   Postpartum depression 10/01/2018   Postpartum anxiety 10/01/2018   Obesity (BMI 30.0-34.9) 04/03/2018   History of umbilical hernia repair 48/54/6270    Concerns addressed today  Delivery Plans Plans to deliver at [facility]. Patient given information for [facility] Healthy Baby website for more information about Women's and [place]. Patient is interested in water birth. Offered upcoming OB visit with CNM to discuss further.  MyChart/Babyscripts MyChart access verified. I explained pt will have some visits in office and some virtually. Babyscripts instructions given and order placed. Patient verifies receipt of  registration text/e-mail. Account successfully created and app downloaded.  Blood Pressure Cuff/Weight Scale Blood pressure cuff ordered for patient to pick-up from [employer]. Explained after first prenatal appt pt will check weekly and document in 42. Patient does not have weight scale; patient may purchase if they desire to track weight weekly in Babyscripts.  Anatomy US Explained first scheduled [place] will be around 19 weeks. Anatomy US scheduled for 19 wks at M[facility]. Pt notified to arrive at TBD.  Labs Discussed Johnsie Cancel genetic screening with patient. Would like both Panorama and Horizon drawn at new OB visit. Routine prenatal labs needed.  COVID Vaccine Patient has not had COVID vaccine.   Social Determinants of Health Food Insecurity: Patient denies food insecurity. WIC Referral: Patient is interested in referral to [facility].  Transportation: Patient denies transportation needs. Childcare: Discussed no children allowed at ultrasound appointments. Offered childcare services; patient declines childcare services at this time.  Interested in [place]? If yes, send referral.   First visit review I reviewed new OB appt with patient. I explained they will have a provider visit that includes Pap, discussion. Explained pt will be seen by Dr. Jodi Mourning at first visit; encounter routed to appropriate provider. Explained that patient will be seen by pregnancy navigator following visit with provider.   Penny Pia, RN 09/21/2022  1:15 PM

## 2022-09-23 ENCOUNTER — Encounter: Payer: Self-pay | Admitting: Obstetrics and Gynecology

## 2022-09-23 DIAGNOSIS — Z2839 Other underimmunization status: Secondary | ICD-10-CM | POA: Insufficient documentation

## 2022-09-23 DIAGNOSIS — O09899 Supervision of other high risk pregnancies, unspecified trimester: Secondary | ICD-10-CM | POA: Insufficient documentation

## 2022-09-29 LAB — PANORAMA PRENATAL TEST FULL PANEL:PANORAMA TEST PLUS 5 ADDITIONAL MICRODELETIONS: FETAL FRACTION: 3.4

## 2022-10-19 ENCOUNTER — Other Ambulatory Visit (HOSPITAL_COMMUNITY)
Admission: RE | Admit: 2022-10-19 | Discharge: 2022-10-19 | Disposition: A | Discharge status: Home / Self Care | Payer: Medicaid Other | Source: Ambulatory Visit | Attending: Obstetrics | Admitting: Obstetrics

## 2022-10-19 ENCOUNTER — Ambulatory Visit (INDEPENDENT_AMBULATORY_CARE_PROVIDER_SITE_OTHER): Payer: Medicaid Other | Admitting: Obstetrics

## 2022-10-19 ENCOUNTER — Encounter: Payer: Self-pay | Admitting: Obstetrics

## 2022-10-19 ENCOUNTER — Ambulatory Visit (INDEPENDENT_AMBULATORY_CARE_PROVIDER_SITE_OTHER): Payer: Medicaid Other | Admitting: Licensed Clinical Social Worker

## 2022-10-19 VITALS — BP 96/63 | HR 79 | Wt 182.0 lb

## 2022-10-19 DIAGNOSIS — Z3481 Encounter for supervision of other normal pregnancy, first trimester: Secondary | ICD-10-CM | POA: Diagnosis not present

## 2022-10-19 DIAGNOSIS — Z1339 Encounter for screening examination for other mental health and behavioral disorders: Secondary | ICD-10-CM

## 2022-10-19 DIAGNOSIS — Z3480 Encounter for supervision of other normal pregnancy, unspecified trimester: Secondary | ICD-10-CM

## 2022-10-19 DIAGNOSIS — Z3A10 10 weeks gestation of pregnancy: Secondary | ICD-10-CM | POA: Diagnosis not present

## 2022-10-19 DIAGNOSIS — E668 Other obesity: Secondary | ICD-10-CM

## 2022-10-19 NOTE — Progress Notes (Signed)
Subjective:    Dana Flores is being seen today for her first obstetrical visit.  This is not a planned pregnancy. She is at 11w5dgestation. Her obstetrical history is significant for large for gestational age, obesity, and anemia . Relationship with FOB: significant other, not living together. Patient does intend to breast feed. Pregnancy history fully reviewed.  The information documented in the HPI was reviewed and verified.  Menstrual History: OB History     Gravida  6   Para  2   Term  2   Preterm  0   AB  3   Living  2      SAB  0   IAB  3   Ectopic  0   Multiple  0   Live Births  2            Patient's last menstrual period was 08/05/2022.    Past Medical History:  Diagnosis Date   Anemia    Constipation    Umbilical hernia     Past Surgical History:  Procedure Laterality Date   TOOTH EXTRACTION  [id number]  UMBILICAL HERNIA REPAIR      (Not in a hospital admission)  No Known Allergies  Social History   Tobacco Use   Smoking status: Former    Types: E-cigarettes    Quit date: 01/22/2020    Years since quitting: 2.7    Passive exposure: Never   Smokeless tobacco: Never   Tobacco comments:    last used a month ago  Substance Use Topics   Alcohol use: Not Currently    Comment: last drink was a month ago    Family History  Problem Relation Age of Onset   Diabetes Paternal Grandmother    Hyperlipidemia Paternal Grandmother    Obesity Paternal Grandmother    Heart disease Maternal Grandmother    Obesity Maternal Grandmother    Early death Maternal Grandmother    ADD / ADHD Father    Alcohol abuse Father    Asthma Father    Drug abuse Father    Hypertension Father    Stroke Father    ADD / ADHD Brother    Anxiety disorder Brother    Asthma Brother      Review of Systems Constitutional: negative for weight loss Gastrointestinal: negative for vomiting Genitourinary:negative for genital lesions and vaginal discharge and  dysuria Musculoskeletal:negative for back pain Behavioral/Psych: negative for abusive relationship, depression, illegal drug usage and tobacco use    Objective:    BP 96/63   Pulse 79   Wt 182 lb (82.6 kg)   LMP 08/05/2022   BMI 36.76 kg/m  General Appearance:    Alert, cooperative, no distress, appears stated age  Head:    Normocephalic, without obvious abnormality, atraumatic  Eyes:    PERRL, conjunctiva/corneas clear, EOM's intact, fundi    benign, both eyes  Ears:    Normal TM's and external ear canals, both ears  Nose:   Nares normal, septum midline, mucosa normal, no drainage    or sinus tenderness  Throat:   Lips, mucosa, and tongue normal; teeth and gums normal  Neck:   Supple, symmetrical, trachea midline, no adenopathy;    thyroid:  no enlargement/tenderness/nodules; no carotid   bruit or JVD  Back:     Symmetric, no curvature, ROM normal, no CVA tenderness  Lungs:     Clear to auscultation bilaterally, respirations unlabored  Chest Wall:    No tenderness or deformity  Heart:    Regular rate and rhythm, S1 and S2 normal, no murmur, rub   or gallop  Breast Exam:    No tenderness, masses, or nipple abnormality  Abdomen:     Soft, non-tender, bowel sounds active all four quadrants,    no masses, no organomegaly  Genitalia:    Normal female without lesion, discharge or tenderness  Extremities:   Extremities normal, atraumatic, no cyanosis or edema  Pulses:   2+ and symmetric all extremities  Skin:   Skin color, texture, turgor normal, no rashes or lesions  Lymph nodes:   Cervical, supraclavicular, and axillary nodes normal  Neurologic:   CNII-XII intact, normal strength, sensation and reflexes    throughout      Lab Review Urine pregnancy test Labs reviewed yes Radiologic studies reviewed yes    Pregnancy at 41w5dweeks    Plan:    1. Supervision of other normal pregnancy, antepartum Rx: - Cytology - PAP( [facility]) - PANORAMA PRENATAL TEST FULL PANEL -  Cervicovaginal ancillary only( [facility])  2. Other obesity affecting pregnancy, antepartum    Prenatal vitamins.  Counseling provided regarding continued use of seat belts, cessation of alcohol consumption, smoking or use of illicit drugs; infection precautions i.e., influenza/TDAP immunizations, toxoplasmosis,CMV, parvovirus, listeria and varicella; workplace safety, exercise during pregnancy; routine dental care, safe medications, sexual activity, hot tubs, saunas, pools, travel, caffeine use, fish and methlymercury, potential toxins, hair treatments, varicose veins Weight gain recommendations per IOM guidelines reviewed: underweight/BMI< 18.5--> gain 28 - 40 lbs; normal weight/BMI 18.5 - 24.9--> gain 25 - 35 lbs; overweight/BMI 25 - 29.9--> gain 15 - 25 lbs; obese/BMI >30->gain  11 - 20 lbs Problem list reviewed and updated. FIRST/CF mutation testing/NIPT/QUAD SCREEN/fragile X/Ashkenazi Jewish population testing/Spinal muscular atrophy discussed: requested. Role of ultrasound in pregnancy discussed; fetal survey: requested. Amniocentesis discussed: not indicated   Orders Placed This Encounter  Procedures   PANORAMA PRENATAL TEST FULL PANEL    ==========Department Information========== ID: [id number]:[facility]Dept: [phone] Fax: [phone]    Order Specific Question:   Expected due date (MM/DD/YYYY):    Answer:   05/12/2023    Order Specific Question:   Is this a twin pregnancy? (viable, no vanished twin)    Answer:   No    Order Specific Question:   Is this a surrogate or egg donor pregnancy?    Answer:   No    Order Specific Question:   I want fetal sex included in the report:    Answer:   Yes    Order Specific Question:   Maternal Weight (lbs):    Answer:   177   Order Specific Question:   Which Microdeletion Panel  should be ordered?    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    Answer:   BProgramme researcher, broadcasting/film/videoQuestion:   By placing this electronic order I confirm the testing ordered herein is medically necessary and this patient has been informed of the details of the genetic test(s) ordered, including the risks, benefits, and alternatives, and has consented to testing.    Answer:   Yes    Order Specific Question:   Select an order diagnosis: For additional  options refer to [email or web address]    Answer:   Encounter for supervision of other normal pregnancy in first trimester [id number]    Follow up in 5 weeks.  I have spent a total of 20 minutes of face-to-face time, excluding clinical staff time, reviewing notes and preparing to see patient, ordering tests and/or medications, and counseling the patient.   Shelly Bombard, MD 10/19/2022 4:03 PM

## 2022-10-20 LAB — CERVICOVAGINAL ANCILLARY ONLY
Bacterial Vaginitis (gardnerella): POSITIVE — AB
Candida Glabrata: NEGATIVE
Candida Vaginitis: NEGATIVE
Chlamydia: NEGATIVE
Comment: NEGATIVE
Comment: NEGATIVE
Comment: NEGATIVE
Comment: NEGATIVE
Comment: NEGATIVE
Comment: NORMAL
Neisseria Gonorrhea: NEGATIVE
Trichomonas: NEGATIVE

## 2022-10-20 NOTE — BH Specialist Note (Signed)
Integrated Behavioral Health Follow Up In-Person Visit  MRN: [id number] Name: Dana Flores  Number of [place] Clinician visits: 1 Session Start time:  3:40pm Session End time: 3:55pm Total time in minutes:  15 mins in person   Types of Service: [place] (BHI)  Interpretor:No. Interpretor Name and Language: none  Subjective: Dana Flores is a 22 y.o. female accompanied by Partner/Significant Other Patient was referred by Dr. Jodi Flores for new ob intro. Patient reports the following symptoms/concerns: no reported concerns  Duration of problem: n/a; Severity of problem: n/a  Objective: Mood: good and Affect: Appropriate Risk of harm to self or others: No plan to harm self or others  Life Context: Family and Social: lives in [place]  School/Work: employed  Self-Care: sleep  Life Changes: new pregnancy  Patient and/or Family's Strengths/Protective Factors: Concrete supports in place (healthy food, safe environments, etc.)  Goals Addressed: Patient will:  Attend all scheduled appts    Take prenatal vitamins    Reduce stress   Progress towards Goals: Achieved  Interventions: Interventions utilized:  Motivational Interviewing Standardized Assessments completed: Not Needed  Patient and/or Family Response: Dana Flores does not report any new concerns. New ob intro completed     Lynnea Ferrier, LCSW

## 2022-10-22 ENCOUNTER — Other Ambulatory Visit: Payer: Self-pay | Admitting: Obstetrics

## 2022-10-24 ENCOUNTER — Other Ambulatory Visit: Payer: Self-pay | Admitting: *Deleted

## 2022-10-24 ENCOUNTER — Other Ambulatory Visit: Payer: Self-pay | Admitting: Obstetrics

## 2022-10-24 DIAGNOSIS — N760 Acute vaginitis: Secondary | ICD-10-CM

## 2022-10-24 LAB — CYTOLOGY - PAP: Diagnosis: NEGATIVE

## 2022-10-24 MED ORDER — METRONIDAZOLE 500 MG PO TABS: 500.0000 mg | ORAL_TABLET | Freq: Two times a day (BID) | ORAL | 0 refills | Status: DC

## 2022-10-24 NOTE — Progress Notes (Signed)
RX Flagyl per protocol. Pt has reviewed results in [place]. Message and education sent through [place].

## 2022-10-26 LAB — PANORAMA PRENATAL TEST FULL PANEL:PANORAMA TEST PLUS 5 ADDITIONAL MICRODELETIONS: FETAL FRACTION: 6

## 2022-11-16 ENCOUNTER — Ambulatory Visit: Payer: Medicaid Other | Admitting: Obstetrics and Gynecology

## 2022-11-16 ENCOUNTER — Encounter: Payer: Self-pay | Admitting: Obstetrics and Gynecology

## 2022-11-16 VITALS — BP 124/71 | HR 89 | Wt 181.6 lb

## 2022-11-16 DIAGNOSIS — E668 Other obesity: Secondary | ICD-10-CM

## 2022-11-16 DIAGNOSIS — O99210 Obesity complicating pregnancy, unspecified trimester: Secondary | ICD-10-CM

## 2022-11-16 DIAGNOSIS — Z3480 Encounter for supervision of other normal pregnancy, unspecified trimester: Secondary | ICD-10-CM

## 2022-11-16 DIAGNOSIS — Z3A14 14 weeks gestation of pregnancy: Secondary | ICD-10-CM

## 2022-11-16 DIAGNOSIS — B9689 Other specified bacterial agents as the cause of diseases classified elsewhere: Secondary | ICD-10-CM

## 2022-11-16 DIAGNOSIS — N760 Acute vaginitis: Secondary | ICD-10-CM

## 2022-11-16 MED ORDER — ASPIRIN 81 MG PO CHEW: 81.0000 mg | CHEWABLE_TABLET | Freq: Every day | ORAL | 6 refills | Status: DC

## 2022-11-16 NOTE — Progress Notes (Signed)
   LOW-RISK PREGNANCY OFFICE VISIT Patient name: Dana Flores MRN [id number]  Date of birth: [birth year] Chief Complaint:   Routine Prenatal Visit  History of Present Illness:   Dana Flores is a 22 y.o. [id number] female at [redacted]w[redacted]d with an Estimated Date of Delivery: 05/12/23 being seen today for ongoing management of a low-risk pregnancy.  Today she reports no complaints. She is still currently taking the Flagyl for BV. She is having difficulties swallowing the pills because of the taste. Contractions: Irritability. Vag. Bleeding: None.   . denies leaking of fluid. Review of Systems:   Pertinent items are noted in HPI Denies abnormal vaginal discharge w/ itching/odor/irritation, headaches, visual changes, shortness of breath, chest pain, abdominal pain, severe nausea/vomiting, or problems with urination or bowel movements unless otherwise stated above. Pertinent History Reviewed:  Reviewed past medical,surgical, social, obstetrical and family history.  Reviewed problem list, medications and allergies. Physical Assessment:   Vitals:   11/16/22 1404  BP: 124/71  Pulse: 89  Weight: 181 lb 9.6 oz (82.4 kg)  Body mass index is 36.68 kg/m.        Physical Examination:   General appearance: Well appearing, and in no distress  Mental status: Alert, oriented to person, place, and time  Skin: Warm & dry  Cardiovascular: Normal heart rate noted  Respiratory: Normal respiratory effort, no distress  Abdomen: Soft, gravid, nontender  Pelvic: Cervical exam deferred         Extremities: Edema: None  Fetal Status: Fetal Heart Rate (bpm): 156        No results found for this or any previous visit (from the past 24 hour(s)).  Assessment & Plan:  1) Low-risk pregnancy [id number] at [redacted]w[redacted]d with an Estimated Date of Delivery: 05/12/23   2) Supervision of other normal pregnancy, antepartum - Discussed upcoming detailed U/S  3) Other obesity affecting pregnancy, antepartum - Rx: aspirin 81 MG chewable  tablet; Chew 1 tablet (81 mg total) by mouth daily.  Dispense: 30 tablet; Refill: 6  4) Bacterial vaginosis - Advised should have completed course of Flagyl before today's visit, but understands why not able to tolerate medication  5) [redacted] weeks gestation of pregnancy    Meds: No orders of the defined types were placed in this encounter.  Labs/procedures today: none  Plan:  Continue routine obstetrical care   Reviewed: Preterm labor symptoms and general obstetric precautions including but not limited to vaginal bleeding, contractions, leaking of fluid and fetal movement were reviewed in detail with the patient.  All questions were answered. Has home bp cuff. Check bp weekly, let us know if >140/90.   Follow-up: Return in about 5 weeks (around 12/21/2022) for Return OB visit.  No orders of the defined types were placed in this encounter.  Laury Deep MSN, CNM 11/16/2022 2:29 PM

## 2022-12-05 ENCOUNTER — Encounter: Payer: Self-pay | Admitting: Obstetrics and Gynecology

## 2022-12-05 ENCOUNTER — Telehealth: Payer: Medicaid Other | Admitting: Physician Assistant

## 2022-12-05 DIAGNOSIS — L723 Sebaceous cyst: Secondary | ICD-10-CM | POA: Diagnosis not present

## 2022-12-05 MED ORDER — CEPHALEXIN 500 MG PO CAPS: 500.0000 mg | ORAL_CAPSULE | Freq: Four times a day (QID) | ORAL | 0 refills | Status: DC

## 2022-12-05 NOTE — Progress Notes (Signed)
Virtual Visit Consent   Dana Flores, you are scheduled for a virtual visit with a [facility] provider today. Just as with appointments in the office, your consent must be obtained to participate. Your consent will be active for this visit and any virtual visit you may have with one of our providers in the next 365 days. If you have a MyChart account, a copy of this consent can be sent to you electronically.  As this is a virtual visit, video technology does not allow for your provider to perform a traditional examination. This may limit your provider's ability to fully assess your condition. If your provider identifies any concerns that need to be evaluated in person or the need to arrange testing (such as labs, EKG, etc.), we will make arrangements to do so. Although advances in technology are sophisticated, we cannot ensure that it will always work on either your end or our end. If the connection with a video visit is poor, the visit may have to be switched to a telephone visit. With either a video or telephone visit, we are not always able to ensure that we have a secure connection.  By engaging in this virtual visit, you consent to the provision of healthcare and authorize for your insurance to be billed (if applicable) for the services provided during this visit. Depending on your insurance coverage, you may receive a charge related to this service.  I need to obtain your verbal consent now. Are you willing to proceed with your visit today? Dana Flores has provided verbal consent on 12/05/2022 for a virtual visit (video or telephone). Dana Loveless, PA-C  Date: 12/05/2022 3:31 PM  Virtual Visit via Video Note   I, Dana Flores, connected with  Dana Flores  ([id number], [birth year]) on 12/05/22 at  3:15 PM EDT by a video-enabled telemedicine application and verified that I am speaking with the correct person using two identifiers.  Location: Patient: Virtual Visit Location  Patient: Home Provider: Virtual Visit Location Provider: Home Office   I discussed the limitations of evaluation and management by telemedicine and the availability of in person appointments. The patient expressed understanding and agreed to proceed.    History of Present Illness: Dana Flores is a 22 y.o. who identifies as a female who was assigned female at birth, and is being seen today for possible cyst/boil. The lesion is noted in the groin. It is a large, red, painful bump. No drainage. Denies fevers, chills, nausea, vomiting. She is [redacted]w[redacted]d. She has had STI screens recently due to pregnancy that were all negative. Did complete metronidazole recently for BV.    Problems:  Patient Active Problem List   Diagnosis Date Noted   Rubella non-immune status, antepartum 09/23/2022   Supervision of other normal pregnancy, antepartum 09/21/2022   Obesity (BMI 30.0-34.9) 04/03/2018   History of umbilical hernia repair 02/05/2018    Allergies: No Known Allergies Medications:  Current Outpatient Medications:    cephALEXin (KEFLEX) 500 MG capsule, Take 1 capsule (500 mg total) by mouth 4 (four) times daily., Disp: 20 capsule, Rfl: 0   aspirin 81 MG chewable tablet, Chew 1 tablet (81 mg total) by mouth daily., Disp: 30 tablet, Rfl: 6   Blood Pressure Monitoring (BLOOD PRESSURE KIT) DEVI, 1 kit by Does not apply route once a week. (Patient not taking: Reported on 11/16/2022), Disp: 1 each, Rfl: 0   Blood Pressure Monitoring (BLOOD PRESSURE KIT) DEVI, 1 Device by Does not apply  route once a week. (Patient not taking: Reported on 11/16/2022), Disp: 1 each, Rfl: 0   Elastic Bandages & Supports (WRIST BRACE/LEFT SMALL) MISC, 1 Device by Does not apply route daily. (Patient not taking: Reported on 10/19/2022), Disp: 1 each, Rfl: 0   Elastic Bandages & Supports (WRIST BRACE/RIGHT SMALL) MISC, 1 Device by Does not apply route daily. (Patient not taking: Reported on 10/19/2022), Disp: 1 each, Rfl: 0   metroNIDAZOLE  (FLAGYL) 500 MG tablet, Take 1 tablet (500 mg total) by mouth 2 (two) times daily., Disp: 14 tablet, Rfl: 0   Misc. Devices (DIGITAL GLASS SCALE) MISC, 1 Device by Does not apply route as directed. (Patient not taking: Reported on 11/16/2022), Disp: 1 each, Rfl: 0   Misc. Devices MISC, Dispense one maternity belt for patient (Patient not taking: Reported on 11/16/2022), Disp: 1 each, Rfl: 0   ondansetron (ZOFRAN ODT) 4 MG disintegrating tablet, Take 1 tablet (4 mg total) by mouth every 8 (eight) hours as needed for nausea or vomiting. (Patient not taking: Reported on 10/19/2022), Disp: 30 tablet, Rfl: 1   polyethylene glycol (MIRALAX / GLYCOLAX) 17 g packet, Take 17 g by mouth daily. (Patient not taking: Reported on 11/16/2022), Disp: , Rfl:    Prenatal Vit-Fe Phos-FA-Omega (VITAFOL GUMMIES) 3.33-0.333-34.8 MG CHEW, Chew 1 tablet by mouth daily., Disp: 90 tablet, Rfl: 12   promethazine (PHENERGAN) 25 MG tablet, Take 1 tablet (25 mg total) by mouth every 6 (six) hours as needed for nausea or vomiting. (Patient not taking: Reported on 11/16/2022), Disp: 30 tablet, Rfl: 1   sertraline (ZOLOFT) 50 MG tablet, Take 1 tablet (50 mg total) by mouth daily. Take half a tablet every day for 6 days (a total of 3 tablets) then one tablet daily every day after that. (Patient not taking: Reported on 10/19/2022), Disp: 30 tablet, Rfl: 3  Observations/Objective: Patient is well-developed, well-nourished in no acute distress.  Resting comfortably at home.  Head is normocephalic, atraumatic.  No labored breathing.  Speech is clear and coherent with logical content.  Patient is alert and oriented at baseline.    Assessment and Plan: 1. Sebaceous cyst - cephALEXin (KEFLEX) 500 MG capsule; Take 1 capsule (500 mg total) by mouth 4 (four) times daily.  Dispense: 20 capsule; Refill: 0  - Suspect sebaceous cyst in the groin - Limit tight clothing - Warm compresses - Epsom salt soaks  - Keflex prescribed - Seek in person  evaluation if continues to worsen or fails to improve  Follow Up Instructions: I discussed the assessment and treatment plan with the patient. The patient was provided an opportunity to ask questions and all were answered. The patient agreed with the plan and demonstrated an understanding of the instructions.  A copy of instructions were sent to the patient via MyChart unless otherwise noted below.    The patient was advised to call back or seek an in-person evaluation if the symptoms worsen or if the condition fails to improve as anticipated.  Time:  I spent 15 minutes with the patient via telehealth technology discussing the above problems/concerns.    Dana Loveless, PA-C

## 2022-12-05 NOTE — Patient Instructions (Signed)
Dana Flores, thank you for joining Dana Loveless, PA-C for today's virtual visit.  While this provider is not your primary care provider (PCP), if your PCP is located in our provider database this encounter information will be shared with them immediately following your visit.   A [facility] MyChart account gives you access to today's visit and all your visits, tests, and labs performed at [facility] " click here if you don't have a [facility] MyChart account or go to [email or web address]  Consent: (Patient) Dana Flores provided verbal consent for this virtual visit at the beginning of the encounter.  Current Medications:  Current Outpatient Medications:    cephALEXin (KEFLEX) 500 MG capsule, Take 1 capsule (500 mg total) by mouth 4 (four) times daily., Disp: 20 capsule, Rfl: 0   aspirin 81 MG chewable tablet, Chew 1 tablet (81 mg total) by mouth daily., Disp: 30 tablet, Rfl: 6   Blood Pressure Monitoring (BLOOD PRESSURE KIT) DEVI, 1 kit by Does not apply route once a week. (Patient not taking: Reported on 11/16/2022), Disp: 1 each, Rfl: 0   Blood Pressure Monitoring (BLOOD PRESSURE KIT) DEVI, 1 Device by Does not apply route once a week. (Patient not taking: Reported on 11/16/2022), Disp: 1 each, Rfl: 0   Elastic Bandages & Supports (WRIST BRACE/LEFT SMALL) MISC, 1 Device by Does not apply route daily. (Patient not taking: Reported on 10/19/2022), Disp: 1 each, Rfl: 0   Elastic Bandages & Supports (WRIST BRACE/RIGHT SMALL) MISC, 1 Device by Does not apply route daily. (Patient not taking: Reported on 10/19/2022), Disp: 1 each, Rfl: 0   metroNIDAZOLE (FLAGYL) 500 MG tablet, Take 1 tablet (500 mg total) by mouth 2 (two) times daily., Disp: 14 tablet, Rfl: 0   Misc. Devices (DIGITAL GLASS SCALE) MISC, 1 Device by Does not apply route as directed. (Patient not taking: Reported on 11/16/2022), Disp: 1 each, Rfl: 0   Misc. Devices MISC, Dispense one maternity belt for patient  (Patient not taking: Reported on 11/16/2022), Disp: 1 each, Rfl: 0   ondansetron (ZOFRAN ODT) 4 MG disintegrating tablet, Take 1 tablet (4 mg total) by mouth every 8 (eight) hours as needed for nausea or vomiting. (Patient not taking: Reported on 10/19/2022), Disp: 30 tablet, Rfl: 1   polyethylene glycol (MIRALAX / GLYCOLAX) 17 g packet, Take 17 g by mouth daily. (Patient not taking: Reported on 11/16/2022), Disp: , Rfl:    Prenatal Vit-Fe Phos-FA-Omega (VITAFOL GUMMIES) 3.33-0.333-34.8 MG CHEW, Chew 1 tablet by mouth daily., Disp: 90 tablet, Rfl: 12   promethazine (PHENERGAN) 25 MG tablet, Take 1 tablet (25 mg total) by mouth every 6 (six) hours as needed for nausea or vomiting. (Patient not taking: Reported on 11/16/2022), Disp: 30 tablet, Rfl: 1   sertraline (ZOLOFT) 50 MG tablet, Take 1 tablet (50 mg total) by mouth daily. Take half a tablet every day for 6 days (a total of 3 tablets) then one tablet daily every day after that. (Patient not taking: Reported on 10/19/2022), Disp: 30 tablet, Rfl: 3   Medications ordered in this encounter:  Meds ordered this encounter  Medications   cephALEXin (KEFLEX) 500 MG capsule    Sig: Take 1 capsule (500 mg total) by mouth 4 (four) times daily.    Dispense:  20 capsule    Refill:  0    Order Specific Question:   Supervising Provider    Answer:   Merrilee Jansky [id number]     *If you need refills  on other medications prior to your next appointment, please contact your pharmacy*  Follow-Up: Call back or seek an in-person evaluation if the symptoms worsen or if the condition fails to improve as anticipated.  [facility] [phone]  Other Instructions Epidermoid Cyst  An epidermoid cyst, also known as epidermal cyst, is a sac made of skin tissue. The sac contains a substance called keratin. Keratin is a protein that is normally secreted through the hair follicles. When keratin becomes trapped in the top layer of skin (epidermis), it can form  an epidermoid cyst. Epidermoid cysts can be found anywhere on your body. These cysts are usually harmless (benign), and they may not cause symptoms unless they become inflamed or infected. What are the causes? This condition may be caused by: A blocked hair follicle. A hair that curls and re-enters the skin instead of growing straight out of the skin (ingrown hair). A blocked pore. Irritated skin. An injury to the skin. Certain conditions that are passed along from parent to child (inherited). Human papillomavirus (HPV). This happens rarely when cysts occur on the bottom of the feet. Long-term (chronic) sun damage to the skin. What increases the risk? The following factors may make you more likely to develop an epidermoid cyst: Having acne. Being female. Having an injury to the skin. Being past puberty. Having certain rare genetic disorders. What are the signs or symptoms? The only symptom of this condition may be a small, painless lump underneath the skin. When an epidermal cyst ruptures, it may become inflamed. True infection in cysts is rare. Symptoms may include: Redness. Inflammation. Tenderness. Warmth. Keratin draining from the cyst. Keratin is grayish-white, bad-smelling substance. Pus draining from the cyst. How is this diagnosed? This condition is diagnosed with a physical exam. In some cases, you may have a sample of tissue (biopsy) taken from your cyst to be examined under a microscope or tested for bacteria. You may be referred to a health care provider who specializes in skin care (dermatologist). How is this treated? If a cyst becomes inflamed, treatment may include: Opening and draining the cyst, done by a health care provider. After draining, minor surgery to remove the rest of the cyst may be done. Taking antibiotic medicine. Having injections of medicines (steroids) that help to reduce inflammation. Having surgery to remove the cyst. Surgery may be done if the  cyst: Becomes large. Bothers you. Has a chance of turning into cancer. Do not try to open a cyst yourself. Follow these instructions at home: Medicines If you were prescribed an antibiotic medicine, take it it as told by your health care provider. Do not stop using the antibiotic even if you start to feel better. Take over-the-counter and prescription medicines only as told by your health care provider. General instructions Keep the area around your cyst clean and dry. Wear loose, dry clothing. Avoid touching your cyst. Check your cyst every day for signs of infection. Check for: Redness, swelling, or pain. Fluid or blood. Warmth. Pus or a bad smell. Keep all follow-up visits. This is important. How is this prevented? Wear clean, dry, clothing. Avoid wearing tight clothing. Keep your skin clean and dry. Take showers or baths every day. Contact a health care provider if: Your cyst develops symptoms of infection. Your condition is not improving or is getting worse. You develop a cyst that looks different from other cysts you have had. You have a fever. Get help right away if: Redness spreads from the cyst  into the surrounding area. Summary An epidermoid cyst is a sac made of skin tissue. These cysts are usually harmless (benign), and they may not cause symptoms unless they become inflamed. If a cyst becomes inflamed, treatment may include surgery to open and drain the cyst, or to remove it. Treatment may also include medicines by mouth or through an injection. Take over-the-counter and prescription medicines only as told by your health care provider. If you were prescribed an antibiotic medicine, take it as told by your health care provider. Do not stop using the antibiotic even if you start to feel better. Contact a health care provider if your condition is not improving or is getting worse. Keep all follow-up visits as told by your health care provider. This is important. This  information is not intended to replace advice given to you by your health care provider. Make sure you discuss any questions you have with your health care provider. Document Revised: 11/06/2019 Document Reviewed: 11/06/2019 Elsevier Patient Education  2023 Elsevier Inc.    If you have been instructed to have an in-person evaluation today at a local Urgent Care facility, please use the link below. It will take you to a list of all of our available [facility], including address, phone number and hours of operation. Please do not delay care.  [facility]  If you or a family member do not have a primary care provider, use the link below to schedule a visit and establish care. When you choose a [facility] primary care physician or advanced practice provider, you gain a long-term partner in health. Find a Primary Care Provider  Learn more about [facility]'s in-office and virtual care options: [facility] - Get Care Now

## 2022-12-16 ENCOUNTER — Ambulatory Visit: Payer: Medicaid Other | Admitting: *Deleted

## 2022-12-16 ENCOUNTER — Encounter: Payer: Self-pay | Admitting: *Deleted

## 2022-12-16 ENCOUNTER — Ambulatory Visit: Payer: Medicaid Other | Attending: Obstetrics and Gynecology

## 2022-12-16 VITALS — BP 98/66 | HR 96

## 2022-12-16 DIAGNOSIS — O99212 Obesity complicating pregnancy, second trimester: Secondary | ICD-10-CM | POA: Insufficient documentation

## 2022-12-16 DIAGNOSIS — Z363 Encounter for antenatal screening for malformations: Secondary | ICD-10-CM | POA: Diagnosis not present

## 2022-12-16 DIAGNOSIS — O99012 Anemia complicating pregnancy, second trimester: Secondary | ICD-10-CM | POA: Insufficient documentation

## 2022-12-16 DIAGNOSIS — Z3480 Encounter for supervision of other normal pregnancy, unspecified trimester: Secondary | ICD-10-CM | POA: Insufficient documentation

## 2022-12-16 DIAGNOSIS — Z3A19 19 weeks gestation of pregnancy: Secondary | ICD-10-CM | POA: Insufficient documentation

## 2022-12-21 ENCOUNTER — Ambulatory Visit (INDEPENDENT_AMBULATORY_CARE_PROVIDER_SITE_OTHER): Payer: Medicaid Other | Admitting: Obstetrics and Gynecology

## 2022-12-21 VITALS — BP 103/68 | HR 91 | Wt 184.0 lb

## 2022-12-21 DIAGNOSIS — R12 Heartburn: Secondary | ICD-10-CM

## 2022-12-21 DIAGNOSIS — Z2839 Supervision of other high risk pregnancies, unspecified trimester: Secondary | ICD-10-CM

## 2022-12-21 DIAGNOSIS — Z3A19 19 weeks gestation of pregnancy: Secondary | ICD-10-CM

## 2022-12-21 DIAGNOSIS — Z9889 Other specified postprocedural states: Secondary | ICD-10-CM

## 2022-12-21 DIAGNOSIS — O26892 Other specified pregnancy related conditions, second trimester: Secondary | ICD-10-CM

## 2022-12-21 DIAGNOSIS — O09899 Supervision of other high risk pregnancies, unspecified trimester: Secondary | ICD-10-CM

## 2022-12-21 DIAGNOSIS — Z3480 Encounter for supervision of other normal pregnancy, unspecified trimester: Secondary | ICD-10-CM

## 2022-12-21 DIAGNOSIS — Z8719 Personal history of other diseases of the digestive system: Secondary | ICD-10-CM

## 2022-12-21 MED ORDER — FAMOTIDINE 20 MG PO TABS: 20.0000 mg | ORAL_TABLET | Freq: Two times a day (BID) | ORAL | 3 refills | Status: DC

## 2022-12-21 NOTE — Progress Notes (Signed)
ROB, c/o throwing up blood on Monday x1.

## 2022-12-21 NOTE — Progress Notes (Signed)
   PRENATAL VISIT NOTE  Subjective:  Dana Flores is a 22 y.o. [id number] at [redacted]w[redacted]d being seen today for ongoing prenatal care.  She is currently monitored for the following issues for this low-risk pregnancy and has History of umbilical hernia repair; Obesity (BMI 30.0-34.9); Supervision of other normal pregnancy, antepartum; and Rubella non-immune status, antepartum on their problem list.  Patient reports  daily heartburn not alleviated with Tums. Does not like taking tums anyways. Had episode of vomiting 2 days ago and had blood in her vomit. Has photo of vomit to show [place] today .  Contractions: Not present. Vag. Bleeding: None.  Movement: Present. Denies leaking of fluid.   The following portions of the patient's history were reviewed and updated as appropriate: allergies, current medications, past family history, past medical history, past social history, past surgical history and problem list.   Objective:   Vitals:   12/21/22 0953  BP: 103/68  Pulse: 91  Weight: 184 lb (83.5 kg)   Fetal Status: Fetal Heart Rate (bpm): 153   Movement: Present     General:  Alert, oriented and cooperative. Patient is in no acute distress.  Skin: Skin is warm and dry. No rash noted.   Cardiovascular: Normal heart rate noted  Respiratory: Normal respiratory effort, no problems with respiration noted  Abdomen: Soft, gravid, appropriate for gestational age.  Pain/Pressure: Present      Assessment and Plan:  Pregnancy: [id number] at [redacted]w[redacted]d 1. Supervision of other normal pregnancy, antepartum 2. [redacted] weeks gestation of pregnancy Normal anatomy Discussed & offered AFP, pt would like to obtain test - AFP, Serum, Open Spina Bifida  3. Heartburn during pregnancy in second trimester Small streak of blood in vomit in picture, reassurance provided. Potentially mucosal irritation from vomiting +/- GERD. Will CTM symptoms. Reviewed ED precautions for hematemesis.  - Will trial mylanta or maalox instead of tums. Rx  for pepcid, pt will pick up if symptoms persist.   4. Rubella non-immune status, antepartum PP MMR  5. History of umbilical hernia repair Pt was in elementary school, unlikely to have mesh but will ask her mom  Please refer to After Visit Summary for other counseling recommendations.   Return in about 4 weeks (around 01/18/2023) for return OB at 24 weeks with CNM.  Future Appointments  Date Time Provider Department Center  01/18/2023 10:55 AM Sue Lush, FNP [facility] None   Lennart Pall, MD

## 2022-12-21 NOTE — Patient Instructions (Signed)
Heart burn medicine - Mylanta or Maalox

## 2022-12-23 LAB — AFP, SERUM, OPEN SPINA BIFIDA
AFP MoM: 1.17
AFP Value: 61.2 ng/mL
Gest. Age on Collection Date: 19.5 weeks
Maternal Age At EDD: 24.3 yr
OSBR Risk 1 IN: 10000
Test Results:: NEGATIVE
Weight: 184 [lb_av]

## 2023-01-18 ENCOUNTER — Encounter: Payer: Self-pay | Admitting: Obstetrics and Gynecology

## 2023-01-18 ENCOUNTER — Encounter: Payer: Medicaid Other | Admitting: Obstetrics and Gynecology

## 2023-01-18 ENCOUNTER — Ambulatory Visit (INDEPENDENT_AMBULATORY_CARE_PROVIDER_SITE_OTHER): Payer: Medicaid Other | Admitting: Obstetrics and Gynecology

## 2023-01-18 VITALS — BP 101/63 | HR 76 | Wt 185.0 lb

## 2023-01-18 DIAGNOSIS — Z3402 Encounter for supervision of normal first pregnancy, second trimester: Secondary | ICD-10-CM

## 2023-01-18 DIAGNOSIS — O09899 Supervision of other high risk pregnancies, unspecified trimester: Secondary | ICD-10-CM

## 2023-01-18 DIAGNOSIS — O09892 Supervision of other high risk pregnancies, second trimester: Secondary | ICD-10-CM

## 2023-01-18 DIAGNOSIS — Z3A23 23 weeks gestation of pregnancy: Secondary | ICD-10-CM

## 2023-01-18 DIAGNOSIS — Z2839 Other underimmunization status: Secondary | ICD-10-CM

## 2023-01-18 NOTE — Progress Notes (Signed)
   PRENATAL VISIT NOTE  Subjective:  Dana Flores is a 22 y.o. [phone] at [redacted]w[redacted]d being seen today for ongoing prenatal care.  She is currently monitored for the following issues for this low-risk pregnancy and has History of umbilical hernia repair; Obesity (BMI 30.0-34.9); Supervision of other normal pregnancy, antepartum; and Rubella non-immune status, antepartum on their problem list.  Patient reports no complaints.  Contractions: Not present. Vag. Bleeding: None.  Movement: Present. Denies leaking of fluid.   The following portions of the patient's history were reviewed and updated as appropriate: allergies, current medications, past family history, past medical history, past social history, past surgical history and problem list.   Objective:   Vitals:   01/18/23 1335  BP: 101/63  Pulse: 76  Weight: 185 lb (83.9 kg)    Fetal Status: Fetal Heart Rate (bpm): 159 Fundal Height: 23 cm Movement: Present     General:  Alert, oriented and cooperative. Patient is in no acute distress.  Skin: Skin is warm and dry. No rash noted.   Cardiovascular: Normal heart rate noted  Respiratory: Normal respiratory effort, no problems with respiration noted  Abdomen: Soft, gravid, appropriate for gestational age.  Pain/Pressure: Present     Pelvic: Cervical exam deferred        Extremities: Normal range of motion.  Edema: None  Mental Status: Normal mood and affect. Normal behavior. Normal judgment and thought content.   Assessment and Plan:  Pregnancy: [id number] at [redacted]w[redacted]d  1. Encounter for supervision of low-risk first pregnancy in second trimester BP and FHR normal FH appropriate Feeling vigorous movement  2. [redacted] weeks gestation of pregnancy Anticipatory guidance regarding GTT and labs next visit Lengthy discussion about waterbirth and options. Patient undecided but really wants to try. Encouraged to take waterbirth class and meet with waterbirth provided so that she is considered eligible.  Discussed she can always change mind.  Discussed pain management options and supportive measure while doing "land" work before getting in tub  3. Rubella non-immune status, antepartum Offer MMR pp  Preterm labor symptoms and general obstetric precautions including but not limited to vaginal bleeding, contractions, leaking of fluid and fetal movement were reviewed in detail with the patient. Please refer to After Visit Summary for other counseling recommendations.   Return in 4 weeks for 2hr GTT with midwife (water birth certified provider)  Future Appointments  Date Time Provider Department Center  02/14/2023  8:00 AM [facility] None  02/14/2023  8:35 AM Leftwich-Kirby, Wilmer Floor, CNM [facility] None    Albertine Grates, FNP

## 2023-01-18 NOTE — Patient Instructions (Signed)

## 2023-01-18 NOTE — Progress Notes (Signed)
Pt presents for ROB visit. Pt c/o pain on the right side. No other concerns.

## 2023-02-06 ENCOUNTER — Encounter: Payer: Self-pay | Admitting: Obstetrics and Gynecology

## 2023-02-14 ENCOUNTER — Other Ambulatory Visit: Payer: Self-pay

## 2023-02-14 ENCOUNTER — Ambulatory Visit (INDEPENDENT_AMBULATORY_CARE_PROVIDER_SITE_OTHER): Payer: Medicaid Other | Admitting: Advanced Practice Midwife

## 2023-02-14 ENCOUNTER — Encounter: Payer: Self-pay | Admitting: Advanced Practice Midwife

## 2023-02-14 VITALS — BP 102/67 | HR 83 | Wt 194.1 lb

## 2023-02-14 DIAGNOSIS — O99013 Anemia complicating pregnancy, third trimester: Secondary | ICD-10-CM

## 2023-02-14 DIAGNOSIS — Z3A27 27 weeks gestation of pregnancy: Secondary | ICD-10-CM

## 2023-02-14 DIAGNOSIS — Z3402 Encounter for supervision of normal first pregnancy, second trimester: Secondary | ICD-10-CM

## 2023-02-14 NOTE — Progress Notes (Signed)
   PRENATAL VISIT NOTE  Subjective:  Dana Flores is a 22 y.o. [phone] at [redacted]w[redacted]d being seen today for ongoing prenatal care.  She is currently monitored for the following issues for this low-risk pregnancy and has History of umbilical hernia repair; Obesity (BMI 30.0-34.9); Supervision of other normal pregnancy, antepartum; and Rubella non-immune status, antepartum on their problem list.  Patient reports no complaints.  Contractions: Not present. Vag. Bleeding: None.  Movement: Present. Denies leaking of fluid.   The following portions of the patient's history were reviewed and updated as appropriate: allergies, current medications, past family history, past medical history, past social history, past surgical history and problem list.   Objective:   Vitals:   02/14/23 0822  BP: 102/67  Pulse: 83  Weight: 194 lb 1.6 oz (88 kg)    Fetal Status: Fetal Heart Rate (bpm): 147   Movement: Present     General:  Alert, oriented and cooperative. Patient is in no acute distress.  Skin: Skin is warm and dry. No rash noted.   Cardiovascular: Normal heart rate noted  Respiratory: Normal respiratory effort, no problems with respiration noted  Abdomen: Soft, gravid, appropriate for gestational age.  Pain/Pressure: Absent     Pelvic: Cervical exam deferred        Extremities: Normal range of motion.  Edema: None  Mental Status: Normal mood and affect. Normal behavior. Normal judgment and thought content.   Assessment and Plan:  Pregnancy: [id number] at [redacted]w[redacted]d 1. Encounter for supervision of low-risk first pregnancy in second trimester --Anticipatory guidance about next visits/weeks of pregnancy given.  - Pt is interested in waterbirth.  No contraindications at this time per chart review/patient assessment.   - Pt to enroll in class, see CNMs for most visits in the office.  - Discussed waterbirth as option for low-risk pregnancy.  Reviewed conditions that may arise during pregnancy that will risk pt  out of waterbirth including hypertension, diabetes, fetal growth restriction <10%ile, etc.  - CBC - Glucose Tolerance, 2 Hours w/1 Hour - HIV Antibody (routine testing w rflx) - RPR  2. [redacted] weeks gestation of pregnancy   Preterm labor symptoms and general obstetric precautions including but not limited to vaginal bleeding, contractions, leaking of fluid and fetal movement were reviewed in detail with the patient. Please refer to After Visit Summary for other counseling recommendations.   No follow-ups on file.  No future appointments.  Sharen Counter, CNM

## 2023-02-14 NOTE — Progress Notes (Signed)
Pt presents for ROB visit. Declines Tdap.

## 2023-02-15 LAB — CBC
Hematocrit: 29.8 % — ABNORMAL LOW (ref 34.0–46.6)
Hemoglobin: 9.6 g/dL — ABNORMAL LOW (ref 11.1–15.9)
MCH: 26 pg — ABNORMAL LOW (ref 26.6–33.0)
MCHC: 32.2 g/dL (ref 31.5–35.7)
MCV: 81 fL (ref 79–97)
Platelets: 209 10*3/uL (ref 150–450)
RBC: 3.69 x10E6/uL — ABNORMAL LOW (ref 3.77–5.28)
RDW: 13.1 % (ref 11.7–15.4)
WBC: 8.9 10*3/uL (ref 3.4–10.8)

## 2023-02-15 LAB — HIV ANTIBODY (ROUTINE TESTING W REFLEX): HIV Screen 4th Generation wRfx: NONREACTIVE

## 2023-02-15 LAB — GLUCOSE TOLERANCE, 2 HOURS W/ 1HR
Glucose, 1 hour: 82 mg/dL (ref 70–179)
Glucose, 2 hour: 66 mg/dL — ABNORMAL LOW (ref 70–152)
Glucose, Fasting: 76 mg/dL (ref 70–91)

## 2023-02-15 LAB — RPR: RPR Ser Ql: NONREACTIVE

## 2023-02-18 MED ORDER — FERROUS SULFATE 325 (65 FE) MG PO TABS
325.0000 mg | ORAL_TABLET | ORAL | 3 refills | Status: DC
Start: 2023-02-18 — End: 2023-05-11

## 2023-02-18 NOTE — Addendum Note (Signed)
Addended by: Sharen Counter A on: 02/18/2023 05:06 AM   Modules accepted: Orders

## 2023-02-28 ENCOUNTER — Ambulatory Visit: Payer: MEDICAID | Admitting: Obstetrics and Gynecology

## 2023-02-28 VITALS — BP 96/63 | HR 88 | Wt 192.7 lb

## 2023-02-28 DIAGNOSIS — Z2839 Other underimmunization status: Secondary | ICD-10-CM

## 2023-02-28 DIAGNOSIS — Z3480 Encounter for supervision of other normal pregnancy, unspecified trimester: Secondary | ICD-10-CM

## 2023-02-28 DIAGNOSIS — Z3A29 29 weeks gestation of pregnancy: Secondary | ICD-10-CM

## 2023-02-28 DIAGNOSIS — Z8719 Personal history of other diseases of the digestive system: Secondary | ICD-10-CM

## 2023-02-28 DIAGNOSIS — Z9889 Other specified postprocedural states: Secondary | ICD-10-CM

## 2023-02-28 DIAGNOSIS — O09899 Supervision of other high risk pregnancies, unspecified trimester: Secondary | ICD-10-CM

## 2023-02-28 NOTE — Progress Notes (Signed)
   PRENATAL VISIT NOTE  Subjective:  Dana Flores is a 22 y.o. [phone] at [redacted]w[redacted]d being seen today for ongoing prenatal care.  She is currently monitored for the following issues for this low-risk pregnancy and has History of umbilical hernia repair; Obesity (BMI 30.0-34.9); Supervision of other normal pregnancy, antepartum; and Rubella non-immune status, antepartum on their problem list.  Patient doing well with no acute concerns today. She reports no complaints.  Contractions: Irritability. Vag. Bleeding: None.  Movement: Present. Denies leaking of fluid.   The following portions of the patient's history were reviewed and updated as appropriate: allergies, current medications, past family history, past medical history, past social history, past surgical history and problem list. Problem list updated.  Objective:   Vitals:   02/28/23 1552  BP: 96/63  Pulse: 88  Weight: 192 lb 11.2 oz (87.4 kg)    Fetal Status: Fetal Heart Rate (bpm): 150 Fundal Height: 29 cm Movement: Present     General:  Alert, oriented and cooperative. Patient is in no acute distress.  Skin: Skin is warm and dry. No rash noted.   Cardiovascular: Normal heart rate noted  Respiratory: Normal respiratory effort, no problems with respiration noted  Abdomen: Soft, gravid, appropriate for gestational age.  Pain/Pressure: Present     Pelvic: Cervical exam deferred        Extremities: Normal range of motion.  Edema: None  Mental Status:  Normal mood and affect. Normal behavior. Normal judgment and thought content.   Assessment and Plan:  Pregnancy: [id number] at [redacted]w[redacted]d  1. Supervision of other normal pregnancy, antepartum Continue routine prenatal care Discussed option for birth control with patient, BTL form signed today, but she is interested in progesterone IUD as well.  2. [redacted] weeks gestation of pregnancy   3. Rubella non-immune status, antepartum Treat after delivery  4. History of umbilical hernia  repair   Preterm labor symptoms and general obstetric precautions including but not limited to vaginal bleeding, contractions, leaking of fluid and fetal movement were reviewed in detail with the patient.  Please refer to After Visit Summary for other counseling recommendations.   Return in about 2 weeks (around 03/14/2023) for ROB, in person.   Mariel Aloe, MD Faculty Attending Center for [facility]

## 2023-02-28 NOTE — Progress Notes (Addendum)
Pt presents for ROB. Not taking ASA, PNV, or FE. Prefers iron infusion

## 2023-03-14 ENCOUNTER — Other Ambulatory Visit: Payer: Self-pay | Admitting: Advanced Practice Midwife

## 2023-03-14 ENCOUNTER — Ambulatory Visit: Payer: MEDICAID | Admitting: Advanced Practice Midwife

## 2023-03-14 VITALS — BP 87/56 | HR 92 | Wt 197.0 lb

## 2023-03-14 DIAGNOSIS — O99013 Anemia complicating pregnancy, third trimester: Secondary | ICD-10-CM

## 2023-03-14 DIAGNOSIS — Z3480 Encounter for supervision of other normal pregnancy, unspecified trimester: Secondary | ICD-10-CM

## 2023-03-14 DIAGNOSIS — D649 Anemia, unspecified: Secondary | ICD-10-CM

## 2023-03-14 DIAGNOSIS — Z3A31 31 weeks gestation of pregnancy: Secondary | ICD-10-CM

## 2023-03-14 NOTE — Progress Notes (Signed)
Pt presents for ROB visit. Pt c/o dizziness.

## 2023-03-14 NOTE — Progress Notes (Signed)
   PRENATAL VISIT NOTE  Subjective:  Dana Flores is a 22 y.o. [id number] at [redacted]w[redacted]d being seen today for ongoing prenatal care.  She is currently monitored for the following issues for this low-risk pregnancy and has History of umbilical hernia repair; Obesity (BMI 30.0-34.9); Supervision of other normal pregnancy, antepartum; and Rubella non-immune status, antepartum on their problem list.  Patient reports  dizziness, feeling "foggy" and fatigue .  Contractions: Irritability. Vag. Bleeding: None.  Movement: Present. Denies leaking of fluid.   The following portions of the patient's history were reviewed and updated as appropriate: allergies, current medications, past family history, past medical history, past social history, past surgical history and problem list.   Objective:   Vitals:   03/14/23 1540  BP: (!) 87/56  Pulse: 92  Weight: 197 lb (89.4 kg)    Fetal Status: Fetal Heart Rate (bpm): 140 Fundal Height: 31 cm Movement: Present     General:  Alert, oriented and cooperative. Patient is in no acute distress.  Skin: Skin is warm and dry. No rash noted.   Cardiovascular: Normal heart rate noted  Respiratory: Normal respiratory effort, no problems with respiration noted  Abdomen: Soft, gravid, appropriate for gestational age.  Pain/Pressure: Present     Pelvic: Cervical exam deferred        Extremities: Normal range of motion.  Edema: Trace  Mental Status: Normal mood and affect. Normal behavior. Normal judgment and thought content.   Assessment and Plan:  Pregnancy: [id number] at [redacted]w[redacted]d 1. Supervision of other normal pregnancy, antepartum --Anticipatory guidance about next visits/weeks of pregnancy given.   2. [redacted] weeks gestation of pregnancy   3. Anemia during pregnancy in third trimester --Hgb 9.6 on 7/2    --Pt is taking gummy PNV and not taking oral iron supplement --Recommended pt take PNV with iron AND an additional supplement every other day --Orders placed for IV iron  infusion  4. Symptomatic anemia   Preterm labor symptoms and general obstetric precautions including but not limited to vaginal bleeding, contractions, leaking of fluid and fetal movement were reviewed in detail with the patient. Please refer to After Visit Summary for other counseling recommendations.   No follow-ups on file.  Future Appointments  Date Time Provider Department Center  03/28/2023  3:50 PM Adam Phenix, MD [facility] None  04/11/2023  3:30 PM Leftwich-Kirby, Wilmer Floor, CNM [facility] None    Sharen Counter, CNM

## 2023-03-28 ENCOUNTER — Ambulatory Visit: Payer: MEDICAID | Admitting: Obstetrics & Gynecology

## 2023-03-28 VITALS — BP 89/61 | HR 95 | Wt 195.0 lb

## 2023-03-28 DIAGNOSIS — Z3480 Encounter for supervision of other normal pregnancy, unspecified trimester: Secondary | ICD-10-CM

## 2023-03-28 DIAGNOSIS — O99013 Anemia complicating pregnancy, third trimester: Secondary | ICD-10-CM

## 2023-03-28 DIAGNOSIS — Z8719 Personal history of other diseases of the digestive system: Secondary | ICD-10-CM

## 2023-03-28 DIAGNOSIS — Z9889 Other specified postprocedural states: Secondary | ICD-10-CM

## 2023-03-28 DIAGNOSIS — Z3A33 33 weeks gestation of pregnancy: Secondary | ICD-10-CM

## 2023-03-28 MED ORDER — SODIUM CHLORIDE 0.9 % IV SOLN
300.0000 mg | INTRAVENOUS | Status: AC
Start: 2023-03-28 — End: 2023-04-11

## 2023-03-28 NOTE — Progress Notes (Signed)
   PRENATAL VISIT NOTE  Subjective:  Dana Flores is a 22 y.o. [phone] at [redacted]w[redacted]d being seen today for ongoing prenatal care.  She is currently monitored for the following issues for this high-risk pregnancy and has History of umbilical hernia repair; Obesity (BMI 30.0-34.9); Supervision of other normal pregnancy, antepartum; and Rubella non-immune status, antepartum on their problem list.  Patient reports  needs iron infusion appointment .  Contractions: Irregular. Vag. Bleeding: None.  Movement: Present. Denies leaking of fluid.   The following portions of the patient's history were reviewed and updated as appropriate: allergies, current medications, past family history, past medical history, past social history, past surgical history and problem list.   Objective:   Vitals:   03/28/23 1559  BP: (!) 89/61  Pulse: 95  Weight: 195 lb (88.5 kg)    Fetal Status: Fetal Heart Rate (bpm): 147   Movement: Present     General:  Alert, oriented and cooperative. Patient is in no acute distress.  Skin: Skin is warm and dry. No rash noted.   Cardiovascular: Normal heart rate noted  Respiratory: Normal respiratory effort, no problems with respiration noted  Abdomen: Soft, gravid, appropriate for gestational age.  Pain/Pressure: Present     Pelvic: Cervical exam deferred        Extremities: Normal range of motion.  Edema: Trace  Mental Status: Normal mood and affect. Normal behavior. Normal judgment and thought content.   Assessment and Plan:  Pregnancy: [id number] at [redacted]w[redacted]d 1. Supervision of other normal pregnancy, antepartum   2. History of umbilical hernia repair   3. Anemia during pregnancy in third trimester Venofer  Preterm labor symptoms and general obstetric precautions including but not limited to vaginal bleeding, contractions, leaking of fluid and fetal movement were reviewed in detail with the patient. Please refer to After Visit Summary for other counseling recommendations.    Return in about 2 weeks (around 04/11/2023).  Future Appointments  Date Time Provider Department Center  04/11/2023  3:30 PM Hurshel Party, CNM [facility] None  04/19/2023  3:30 PM Leftwich-Kirby, Wilmer Floor, CNM [facility] None  04/26/2023  4:10 PM Sue Lush, FNP [facility] None  05/02/2023  3:30 PM Wyn Forster, MD [facility] None  05/09/2023  3:30 PM Leftwich-Kirby, Wilmer Floor, CNM [facility] None  05/16/2023  3:30 PM Hessie Dibble, MD [facility] None    Scheryl Darter, MD

## 2023-04-11 ENCOUNTER — Other Ambulatory Visit (HOSPITAL_COMMUNITY)
Admission: RE | Admit: 2023-04-11 | Discharge: 2023-04-11 | Disposition: A | Discharge status: Home / Self Care | Payer: MEDICAID | Source: Ambulatory Visit | Attending: Advanced Practice Midwife | Admitting: Advanced Practice Midwife

## 2023-04-11 ENCOUNTER — Ambulatory Visit (INDEPENDENT_AMBULATORY_CARE_PROVIDER_SITE_OTHER): Payer: MEDICAID | Admitting: Advanced Practice Midwife

## 2023-04-11 ENCOUNTER — Other Ambulatory Visit: Payer: Self-pay | Admitting: Advanced Practice Midwife

## 2023-04-11 VITALS — BP 125/81 | HR 86 | Wt 194.7 lb

## 2023-04-11 DIAGNOSIS — Z3480 Encounter for supervision of other normal pregnancy, unspecified trimester: Secondary | ICD-10-CM | POA: Insufficient documentation

## 2023-04-11 DIAGNOSIS — Z3A35 35 weeks gestation of pregnancy: Secondary | ICD-10-CM

## 2023-04-11 DIAGNOSIS — D649 Anemia, unspecified: Secondary | ICD-10-CM | POA: Insufficient documentation

## 2023-04-11 DIAGNOSIS — O99013 Anemia complicating pregnancy, third trimester: Secondary | ICD-10-CM | POA: Insufficient documentation

## 2023-04-11 DIAGNOSIS — Z3483 Encounter for supervision of other normal pregnancy, third trimester: Secondary | ICD-10-CM | POA: Diagnosis present

## 2023-04-11 DIAGNOSIS — O479 False labor, unspecified: Secondary | ICD-10-CM

## 2023-04-11 NOTE — Progress Notes (Unsigned)
   PRENATAL VISIT NOTE  Subjective:  Dana Flores is a 22 y.o. [id number] at [redacted]w[redacted]d being seen today for ongoing prenatal care.  She is currently monitored for the following issues for this {Blank single:19197::"high-risk","low-risk"} pregnancy and has History of umbilical hernia repair; Obesity (BMI 30.0-34.9); Supervision of other normal pregnancy, antepartum; Rubella non-immune status, antepartum; Symptomatic anemia; and Anemia affecting pregnancy in third trimester on their problem list.  Patient reports {sx:[id number]}.   .  .   . Denies leaking of fluid.   The following portions of the patient's history were reviewed and updated as appropriate: allergies, current medications, past family history, past medical history, past social history, past surgical history and problem list.   Objective:  There were no vitals filed for this visit.  Fetal Status:           General:  Alert, oriented and cooperative. Patient is in no acute distress.  Skin: Skin is warm and dry. No rash noted.   Cardiovascular: Normal heart rate noted  Respiratory: Normal respiratory effort, no problems with respiration noted  Abdomen: Soft, gravid, appropriate for gestational age.        Pelvic: {Blank single:19197::"Cervical exam performed in the presence of a chaperone","Cervical exam deferred"}        Extremities: Normal range of motion.     Mental Status: Normal mood and affect. Normal behavior. Normal judgment and thought content.   Assessment and Plan:  Pregnancy: [id number] at [redacted]w[redacted]d 1. Symptomatic anemia ***  2. Anemia affecting pregnancy in third trimester ***  3. Supervision of other normal pregnancy, antepartum ***  4. [redacted] weeks gestation of pregnancy ***   {Blank single:19197::"Term","Preterm"} labor symptoms and general obstetric precautions including but not limited to vaginal bleeding, contractions, leaking of fluid and fetal movement were reviewed in detail with the patient. Please refer to After  Visit Summary for other counseling recommendations.   No follow-ups on file.  Future Appointments  Date Time Provider Department Center  04/19/2023  3:30 PM Hurshel Party, CNM [facility] None  04/26/2023  4:10 PM Sue Lush, FNP [facility] None  05/02/2023  3:30 PM Wyn Forster, MD [facility] None  05/09/2023  3:30 PM Leftwich-Kirby, Wilmer Floor, CNM [facility] None  05/16/2023  3:30 PM Hessie Dibble, MD [facility] None    Sharen Counter, CNM

## 2023-04-12 LAB — CERVICOVAGINAL ANCILLARY ONLY
Bacterial Vaginitis (gardnerella): NEGATIVE
Candida Glabrata: NEGATIVE
Candida Vaginitis: POSITIVE — AB
Chlamydia: NEGATIVE
Comment: NEGATIVE
Comment: NEGATIVE
Comment: NEGATIVE
Comment: NEGATIVE
Comment: NEGATIVE
Comment: NORMAL
Neisseria Gonorrhea: NEGATIVE
Trichomonas: NEGATIVE

## 2023-04-15 ENCOUNTER — Inpatient Hospital Stay (HOSPITAL_COMMUNITY)
Admission: AD | Admit: 2023-04-15 | Discharge: 2023-04-15 | Disposition: A | Discharge status: Home / Self Care | Payer: MEDICAID | Attending: Obstetrics and Gynecology | Admitting: Obstetrics and Gynecology

## 2023-04-15 ENCOUNTER — Other Ambulatory Visit: Payer: Self-pay

## 2023-04-15 ENCOUNTER — Encounter (HOSPITAL_COMMUNITY): Payer: Self-pay | Admitting: Obstetrics and Gynecology

## 2023-04-15 DIAGNOSIS — O4703 False labor before 37 completed weeks of gestation, third trimester: Secondary | ICD-10-CM | POA: Diagnosis not present

## 2023-04-15 DIAGNOSIS — R102 Pelvic and perineal pain: Secondary | ICD-10-CM | POA: Diagnosis not present

## 2023-04-15 DIAGNOSIS — B3731 Acute candidiasis of vulva and vagina: Secondary | ICD-10-CM | POA: Diagnosis not present

## 2023-04-15 DIAGNOSIS — O479 False labor, unspecified: Secondary | ICD-10-CM

## 2023-04-15 DIAGNOSIS — Z3A36 36 weeks gestation of pregnancy: Secondary | ICD-10-CM | POA: Diagnosis not present

## 2023-04-15 DIAGNOSIS — O23593 Infection of other part of genital tract in pregnancy, third trimester: Secondary | ICD-10-CM | POA: Insufficient documentation

## 2023-04-15 DIAGNOSIS — B372 Candidiasis of skin and nail: Secondary | ICD-10-CM

## 2023-04-15 DIAGNOSIS — O26893 Other specified pregnancy related conditions, third trimester: Secondary | ICD-10-CM | POA: Insufficient documentation

## 2023-04-15 LAB — URINALYSIS, ROUTINE W REFLEX MICROSCOPIC
Bilirubin Urine: NEGATIVE
Glucose, UA: 50 mg/dL — AB
Hgb urine dipstick: NEGATIVE
Ketones, ur: NEGATIVE mg/dL
Nitrite: NEGATIVE
Protein, ur: NEGATIVE mg/dL
Specific Gravity, Urine: 1.011 (ref 1.005–1.030)
pH: 7 (ref 5.0–8.0)

## 2023-04-15 LAB — CULTURE, BETA STREP (GROUP B ONLY): Strep Gp B Culture: NEGATIVE

## 2023-04-15 MED ORDER — FLUCONAZOLE 150 MG PO TABS: ORAL_TABLET | ORAL | 0 refills | Status: DC

## 2023-04-15 MED ORDER — NYSTATIN 100000 UNIT/GM EX POWD: 1.0000 | Freq: Three times a day (TID) | CUTANEOUS | 0 refills | Status: DC

## 2023-04-15 NOTE — Progress Notes (Signed)
   PRENATAL VISIT NOTE  Subjective:  Dana Flores is a 22 y.o. G4W1027 at [redacted]w[redacted]d being seen today for ongoing prenatal care.  She is currently monitored for the following issues for this low-risk pregnancy and has History of umbilical hernia repair; Obesity (BMI 30.0-34.9); Supervision of other normal pregnancy, antepartum; Rubella non-immune status, antepartum; Symptomatic anemia; and Anemia affecting pregnancy in third trimester on their problem list.  Patient reports occasional contractions.  Contractions: Irritability. Vag. Bleeding: None.  Movement: Present. Denies leaking of fluid.   The following portions of the patient's history were reviewed and updated as appropriate: allergies, current medications, past family history, past medical history, past social history, past surgical history and problem list.   Objective:   Vitals:   04/11/23 1609  BP: 125/81  Pulse: 86  Weight: 194 lb 11.2 oz (88.3 kg)    Fetal Status:   Fundal Height: 35 cm Movement: Present  Presentation: Vertex  General:  Alert, oriented and cooperative. Patient is in no acute distress.  Skin: Skin is warm and dry. No rash noted.   Cardiovascular: Normal heart rate noted  Respiratory: Normal respiratory effort, no problems with respiration noted  Abdomen: Soft, gravid, appropriate for gestational age.  Pain/Pressure: Present     Pelvic: Cervical exam performed in the presence of a chaperone Dilation: 1.5 Effacement (%): 50 Station: -2  Extremities: Normal range of motion.  Edema: Trace  Mental Status: Normal mood and affect. Normal behavior. Normal judgment and thought content.   Assessment and Plan:  Pregnancy: [id number] at [redacted]w[redacted]d 1. Supervision of other normal pregnancy, antepartum --Anticipatory guidance about next visits/weeks of pregnancy given.   - Culture, beta strep (group b only) - Cervicovaginal ancillary only( )  2. [redacted] weeks gestation of pregnancy   3. Braxton Hicks  contractions --Pt with irregular but painful cramping and pressure.  Reports she was thinking about waterbirth but missed the class. Now that she is having this pain, she is not sure she would want a waterbirth anyway.   Reviewed options, including hydrotherapy in the shower and pain medication options.  Term labor symptoms and general obstetric precautions including but not limited to vaginal bleeding, contractions, leaking of fluid and fetal movement were reviewed in detail with the patient. Please refer to After Visit Summary for other counseling recommendations.   No follow-ups on file.  Future Appointments  Date Time Provider Department Center  04/19/2023  3:30 PM Hurshel Party, CNM [facility] None  04/26/2023  4:10 PM Sue Lush, FNP [facility] None  05/02/2023  3:30 PM Wyn Forster, MD [facility] None  05/09/2023  3:30 PM Leftwich-Kirby, Wilmer Floor, CNM [facility] None  05/16/2023  3:30 PM Hessie Dibble, MD [facility] None    Sharen Counter, CNM

## 2023-04-15 NOTE — MAU Provider Note (Signed)
Chief Complaint:  Pelvic Pain   Event Date/Time   First Provider Initiated Contact with Patient 04/15/23 1440      HPI: Dana Flores is a 22 y.o. [id number] at [redacted]w[redacted]d who presents to maternity admissions reporting pelvic and vaginal pain.  Pain is constant, but worsens with standing walking.  Pain is constant pressure with intermittent sharp shooting pain.   She reports good fetal movement, denies LOF, vaginal bleeding, vaginal itching/burning, urinary symptoms, h/a, dizziness, n/v, or fever/chills.    HPI  Past Medical History: Past Medical History:  Diagnosis Date   Anemia    Anemia in pregnancy, third trimester 07/31/2020   HGB 8 at 27 weeks - needs iron infusion scheduled  Hgb 12.0 at 36 weeks   Constipation    Postpartum anxiety 10/01/2018   Postpartum depression 10/01/2018   Umbilical hernia     Past obstetric history: OB History  Gravida Para Term Preterm AB Living  6 2 2  0 3 2  SAB IAB Ectopic Multiple Live Births  0 3 0 0 2    # Outcome Date GA Lbr Len/2nd Weight Sex Type Anes PTL Lv  6 Current           5 Term 10/23/20 [redacted]w[redacted]d 09:05 / 00:18 3685 g F Vag-Spont EPI  LIV  4 IAB 10/2019          3 Term 08/21/18 [redacted]w[redacted]d 11:53 / 01:16 3255 g M Vag-Spont EPI  LIV  2 IAB           1 IAB             Past Surgical History: Past Surgical History:  Procedure Laterality Date   TOOTH EXTRACTION  2014   UMBILICAL HERNIA REPAIR      Family History: Family History  Problem Relation Age of Onset   ADD / ADHD Father    Alcohol abuse Father    Asthma Father    Drug abuse Father    Hypertension Father    ADD / ADHD Brother    Anxiety disorder Brother    Asthma Brother    Heart disease Maternal Grandmother    Obesity Maternal Grandmother    Early death Maternal Grandmother    Diabetes Paternal Grandmother    Hyperlipidemia Paternal Grandmother    Obesity Paternal Grandmother    Stroke Paternal Grandfather     Social History: Social History   Tobacco Use   Smoking  status: Former    Types: E-cigarettes    Quit date: 01/22/2020    Years since quitting: 3.2    Passive exposure: Never   Smokeless tobacco: Never   Tobacco comments:    last used a month ago  Vaping Use   Vaping status: Never Used  Substance Use Topics   Alcohol use: Not Currently    Comment: last drink was a month ago   Drug use: Not Currently    Comment: pt. states she hasn't used since 2017    Allergies: No Known Allergies  Meds:  Medications Prior to Admission  Medication Sig Dispense Refill Last Dose   calcium carbonate (TUMS - DOSED IN MG ELEMENTAL CALCIUM) 500 MG chewable tablet Chew 1 tablet by mouth daily.   04/15/2023 at 1300   Prenatal Vit-Fe Phos-FA-Omega (VITAFOL GUMMIES) 3.33-0.333-34.8 MG CHEW Chew 1 tablet by mouth daily. 90 tablet 12 04/14/2023 at 1400   aspirin 81 MG chewable tablet Chew 1 tablet (81 mg total) by mouth daily. (Patient not taking: Reported on 02/28/2023)  30 tablet 6    Blood Pressure Monitoring (BLOOD PRESSURE KIT) DEVI 1 kit by Does not apply route once a week. (Patient not taking: Reported on 11/16/2022) 1 each 0    Blood Pressure Monitoring (BLOOD PRESSURE KIT) DEVI 1 Device by Does not apply route once a week. (Patient not taking: Reported on 11/16/2022) 1 each 0    famotidine (PEPCID) 20 MG tablet Take 1 tablet (20 mg total) by mouth 2 (two) times daily. (Patient not taking: Reported on 02/14/2023) 60 tablet 3    ferrous sulfate 325 (65 FE) MG tablet Take 1 tablet (325 mg total) by mouth every other day. (Patient not taking: Reported on 03/14/2023) 30 tablet 3    Misc. Devices (DIGITAL GLASS SCALE) MISC 1 Device by Does not apply route as directed. (Patient not taking: Reported on 11/16/2022) 1 each 0     ROS:  Review of Systems   I have reviewed patient's Past Medical Hx, Surgical Hx, Family Hx, Social Hx, medications and allergies.   Physical Exam  Patient Vitals for the past 24 hrs:  BP Temp Temp src Pulse Resp SpO2 Height Weight  04/15/23 1413 (!)  103/59 -- -- (!) 105 -- -- -- --  04/15/23 1350 101/61 98.6 F (37 C) Oral (!) 106 17 100 % 4\' 11"  (1.499 m) 89.2 kg   Constitutional: Well-developed, well-nourished female in no acute distress.  Cardiovascular: normal rate Respiratory: normal effort GI: Abd soft, non-tender, gravid appropriate for gestational age.  MS: Extremities nontender, no edema, normal ROM Neurologic: Alert and oriented x 4.  GU: Neg CVAT.  PELVIC EXAM: Cervix pink, visually closed, without lesion, scant white creamy discharge, vaginal walls and external genitalia normal Bimanual exam: Cervix 0/long/high, firm, anterior, neg CMT, uterus nontender, nonenlarged, adnexa without tenderness, enlargement, or mass  Dilation: 1.5 Effacement (%): 50 Station: -2 Exam by:: Sharen Counter, CNM  FHT:  Baseline 155 , moderate variability, accelerations present, no decelerations Contractions: irregular, mild to palpation   Labs: No results found for this or any previous visit (from the past 24 hour(s)). O/Positive/-- (02/05 1025)  Imaging:  No results found.  MAU Course/MDM: Orders Placed This Encounter  Procedures   Urinalysis, Routine w reflex microscopic -Urine, Clean Catch   Discharge patient    No orders of the defined types were placed in this encounter.    NST reviewed and reactive Cervix unchanged from office visit this week, no evidence of labor Rest/ice/heat/warm bath/increase PO fluids/Tylenol/pregnancy support belt  Labor precautions reviewed Pt discharge with strict return precautions.    Assessment: 1. Vaginal candidiasis   2. Candidal skin infection   3. Braxton Hicks contractions   4. [redacted] weeks gestation of pregnancy   5. Vaginal pain     Plan: Discharge home Labor precautions and fetal kick counts  Follow-up Information     [facility] Follow up.   Why: As scheduled Contact information: [address] [place]  [address] [phone]        [facility] Follow up.   Specialty: Obstetrics and Gynecology Why: As needed for emergencies Contact information: [address] [place] [address] [phone]               Allergies as of 04/15/2023   No Known Allergies      Medication List     TAKE these medications    aspirin 81 MG chewable tablet Chew 1 tablet (81 mg total)  by mouth daily.   Blood Pressure Kit Devi 1 kit by Does not apply route once a week.   Blood Pressure Kit Devi 1 Device by Does not apply route once a week.   calcium carbonate 500 MG chewable tablet Commonly known as: TUMS - dosed in mg elemental calcium Chew 1 tablet by mouth daily.   Digital Glass Scale Misc 1 Device by Does not apply route as directed.   famotidine 20 MG tablet Commonly known as: PEPCID Take 1 tablet (20 mg total) by mouth 2 (two) times daily.   ferrous sulfate 325 (65 FE) MG tablet Take 1 tablet (325 mg total) by mouth every other day.   Vitafol Gummies 3.33-0.333-34.8 MG Chew Chew 1 tablet by mouth daily.        Sharen Counter Certified Nurse-Midwife 04/15/2023 3:50 PM

## 2023-04-15 NOTE — Discharge Instructions (Signed)
Reasons to return to MAU at [facility]:  1.  Contractions are  5 minutes apart or less, each last 1 minute, these have been going on for 1-2 hours, and you cannot walk or talk during them 2.  You have a large gush of fluid, or a trickle of fluid that will not stop and you have to wear a pad 3.  You have bleeding that is bright red, heavier than spotting--like menstrual bleeding (spotting can be normal in early labor or after a check of your cervix) 4.  You do not feel the baby moving like he/she normally does  

## 2023-04-15 NOTE — MAU Note (Signed)
.  Dana Flores is a 22 y.o. at [redacted]w[redacted]d here in MAU reporting: pelvic pain that increases with fetal movement. Denies VB and LOF. +FM. Pt reports irregular occasional ctx. Pt was seen in the office on 8/27, has a positive yeast infection.    Pain score: 7/10 Vitals:   04/15/23 1350  BP: 101/61  Pulse: (!) 106  SpO2: 100%     FHT: 150 Lab orders placed from triage:  None

## 2023-04-18 ENCOUNTER — Telehealth: Payer: Self-pay | Admitting: Pharmacy Technician

## 2023-04-18 NOTE — Telephone Encounter (Signed)
Auth Submission: NO AUTH NEEDED Site of care: Site of care: [facility] Payer: [employer] Medication & CPT/J Code(s) submitted: Venofer (Iron Sucrose) J1756 Route of submission (phone, fax, portal):  Phone # Fax # Auth type: Buy/Bill PB Units/visits requested: X2 Reference number:  Approval from: 04/18/23 to 08/15/23

## 2023-04-19 ENCOUNTER — Encounter: Payer: Self-pay | Admitting: Advanced Practice Midwife

## 2023-04-19 ENCOUNTER — Ambulatory Visit (INDEPENDENT_AMBULATORY_CARE_PROVIDER_SITE_OTHER): Payer: MEDICAID | Admitting: Advanced Practice Midwife

## 2023-04-19 VITALS — BP 119/66 | HR 66 | Wt 196.9 lb

## 2023-04-19 DIAGNOSIS — Z3A36 36 weeks gestation of pregnancy: Secondary | ICD-10-CM | POA: Diagnosis not present

## 2023-04-19 DIAGNOSIS — Z3483 Encounter for supervision of other normal pregnancy, third trimester: Secondary | ICD-10-CM | POA: Diagnosis not present

## 2023-04-19 DIAGNOSIS — B372 Candidiasis of skin and nail: Secondary | ICD-10-CM

## 2023-04-19 DIAGNOSIS — B3731 Acute candidiasis of vulva and vagina: Secondary | ICD-10-CM

## 2023-04-19 DIAGNOSIS — Z3480 Encounter for supervision of other normal pregnancy, unspecified trimester: Secondary | ICD-10-CM

## 2023-04-19 DIAGNOSIS — D649 Anemia, unspecified: Secondary | ICD-10-CM

## 2023-04-19 NOTE — Progress Notes (Signed)
   PRENATAL VISIT NOTE  Subjective:  Dana Flores is a 22 y.o. [phone] at [redacted]w[redacted]d being seen today for ongoing prenatal care.  She is currently monitored for the following issues for this low-risk pregnancy and has History of umbilical hernia repair; Obesity (BMI 30.0-34.9); Supervision of other normal pregnancy, antepartum; Rubella non-immune status, antepartum; Symptomatic anemia; and Anemia affecting pregnancy in third trimester on their problem list.  Patient reports occasional contractions.  Contractions: Irritability. Vag. Bleeding: None.  Movement: Present. Denies leaking of fluid.   The following portions of the patient's history were reviewed and updated as appropriate: allergies, current medications, past family history, past medical history, past social history, past surgical history and problem list.   Objective:   Vitals:   04/19/23 1538  BP: 119/66  Pulse: 66  Weight: 196 lb 14.4 oz (89.3 kg)    Fetal Status: Fetal Heart Rate (bpm): 145   Movement: Present     General:  Alert, oriented and cooperative. Patient is in no acute distress.  Skin: Skin is warm and dry. No rash noted.   Cardiovascular: Normal heart rate noted  Respiratory: Normal respiratory effort, no problems with respiration noted  Abdomen: Soft, gravid, appropriate for gestational age.  Pain/Pressure: Present     Pelvic: Cervical exam deferred        Extremities: Normal range of motion.  Edema: None  Mental Status: Normal mood and affect. Normal behavior. Normal judgment and thought content.   Assessment and Plan:  Pregnancy: [id number] at [redacted]w[redacted]d 1. Supervision of other normal pregnancy, antepartum --Anticipatory guidance about next visits/weeks of pregnancy given.  --Medical leave form signed with leave from 9/13 to 10/21   2. Vaginal candidiasis --Improved after treatment on 8/31  3. Candidal skin infection   4. Symptomatic anemia --Venofer infusion scheduled 9/13  5. [redacted] weeks gestation of  pregnancy   Term labor symptoms and general obstetric precautions including but not limited to vaginal bleeding, contractions, leaking of fluid and fetal movement were reviewed in detail with the patient. Please refer to After Visit Summary for other counseling recommendations.   Return in about 1 week (around 04/26/2023) for Midwife preferred.  Future Appointments  Date Time Provider Department Center  04/26/2023  4:10 PM Sue Lush, FNP [facility] None  04/28/2023 10:30 AM [facility] None  05/02/2023  3:30 PM Wyn Forster, MD [facility] None  05/09/2023  3:30 PM Leftwich-Kirby, Wilmer Floor, CNM [facility] None  05/16/2023  3:30 PM Hessie Dibble, MD [facility] None    Sharen Counter, CNM

## 2023-04-26 ENCOUNTER — Ambulatory Visit (INDEPENDENT_AMBULATORY_CARE_PROVIDER_SITE_OTHER): Payer: MEDICAID | Admitting: Obstetrics and Gynecology

## 2023-04-26 VITALS — BP 103/66 | HR 85 | Wt 198.0 lb

## 2023-04-26 DIAGNOSIS — Z3480 Encounter for supervision of other normal pregnancy, unspecified trimester: Secondary | ICD-10-CM

## 2023-04-26 DIAGNOSIS — O99013 Anemia complicating pregnancy, third trimester: Secondary | ICD-10-CM

## 2023-04-26 DIAGNOSIS — Z3A37 37 weeks gestation of pregnancy: Secondary | ICD-10-CM

## 2023-04-26 NOTE — Progress Notes (Signed)
   PRENATAL VISIT NOTE  Subjective:  Dana Flores is a 22 y.o. [phone] at [redacted]w[redacted]d being seen today for ongoing prenatal care.  She is currently monitored for the following issues for this low-risk pregnancy and has History of umbilical hernia repair; Obesity (BMI 30.0-34.9); Supervision of other normal pregnancy, antepartum; Rubella non-immune status, antepartum; Symptomatic anemia; and Anemia affecting pregnancy in third trimester on their problem list.  Patient reports  irregular contractions, no pattern yet. Feels like baby is laying sideways .  Contractions: Irritability. Vag. Bleeding: None.  Movement: Present. Denies leaking of fluid.   The following portions of the patient's history were reviewed and updated as appropriate: allergies, current medications, past family history, past medical history, past social history, past surgical history and problem list.   Objective:   Vitals:   04/26/23 1613  BP: 103/66  Pulse: 85  Weight: 198 lb (89.8 kg)    Fetal Status: Fetal Heart Rate (bpm): 151 Fundal Height: 38 cm Movement: Present  Presentation: Vertex (confirmed bedside u/s)  General:  Alert, oriented and cooperative. Patient is in no acute distress.  Skin: Skin is warm and dry. No rash noted.   Cardiovascular: Normal heart rate noted  Respiratory: Normal respiratory effort, no problems with respiration noted  Abdomen: Soft, gravid, appropriate for gestational age.  Pain/Pressure: Present     Pelvic: Cervical exam performed in the presence of a chaperone Dilation: 1.5 Effacement (%): 50 Station: -2  Extremities: Normal range of motion.  Edema: Trace  Mental Status: Normal mood and affect. Normal behavior. Normal judgment and thought content.   Assessment and Plan:  Pregnancy: A5W0981 at [redacted]w[redacted]d 1. Supervision of other normal pregnancy, antepartum BP and FHR normal Feeling regular fetal movement FH appropriate  2. Anemia affecting pregnancy in third trimester 7/2 hgb 9.6, iv  venofer scheduled for 9/13  3. [redacted] weeks gestation of pregnancy Really desires elective IOL, already on the hospital book for 9/20 if available.  Desired sve 1.5/50-2, discussed membrane sweep next week  Term labor symptoms and general obstetric precautions including but not limited to vaginal bleeding, contractions, leaking of fluid and fetal movement were reviewed in detail with the patient. Please refer to After Visit Summary for other counseling recommendations.   Return in one week for routine prenatal  Future Appointments  Date Time Provider Department Center  04/28/2023 10:30 AM [facility] None  05/02/2023  3:30 PM Wyn Forster, MD [facility] None  05/09/2023  3:30 PM Leftwich-Kirby, Wilmer Floor, CNM [facility] None  05/16/2023  3:30 PM Hessie Dibble, MD [facility] None    Albertine Grates, FNP

## 2023-04-26 NOTE — Progress Notes (Signed)
Pt presents for ROB visit. Pt requesting cervical check and elective induction.

## 2023-04-28 ENCOUNTER — Ambulatory Visit: Payer: MEDICAID

## 2023-04-28 VITALS — BP 93/61 | HR 97 | Temp 99.4°F | Resp 16 | Ht 59.0 in | Wt 201.4 lb

## 2023-04-28 DIAGNOSIS — Z3A38 38 weeks gestation of pregnancy: Secondary | ICD-10-CM

## 2023-04-28 DIAGNOSIS — O99013 Anemia complicating pregnancy, third trimester: Secondary | ICD-10-CM

## 2023-04-28 DIAGNOSIS — D508 Other iron deficiency anemias: Secondary | ICD-10-CM | POA: Diagnosis not present

## 2023-04-28 DIAGNOSIS — D649 Anemia, unspecified: Secondary | ICD-10-CM

## 2023-04-28 MED ORDER — DIPHENHYDRAMINE HCL 25 MG PO CAPS
25.0000 mg | ORAL_CAPSULE | Freq: Once | ORAL | Status: AC
  Administered 2023-04-28: 25 mg via ORAL
  Filled 2023-04-28: qty 1

## 2023-04-28 MED ORDER — ACETAMINOPHEN 325 MG PO TABS
650.0000 mg | ORAL_TABLET | Freq: Once | ORAL | Status: AC
  Administered 2023-04-28: 650 mg via ORAL
  Filled 2023-04-28: qty 2

## 2023-04-28 MED ORDER — IRON SUCROSE 500 MG IVPB - SIMPLE MED
500.0000 mg | Freq: Once | INTRAVENOUS | Status: AC
  Administered 2023-04-28: 500 mg via INTRAVENOUS
  Filled 2023-04-28: qty 500

## 2023-04-28 NOTE — Progress Notes (Signed)
Diagnosis: Acute Anemia  Provider:  Chilton Greathouse MD  Procedure: IV Infusion  IV Type: Peripheral, IV Location: R Antecubital  Venofer (Iron Sucrose), Dose: 500 mg  Infusion Start Time: 1108  Infusion Stop Time: 1527  Post Infusion IV Care: Observation period completed and Peripheral IV Discontinued  Patient did not want to make a subsequent appointment two weeks from today.  Discharge: Condition: Good, Destination: Home . AVS Provided  Performed by:  Wyvonne Lenz, RN

## 2023-05-02 ENCOUNTER — Ambulatory Visit (INDEPENDENT_AMBULATORY_CARE_PROVIDER_SITE_OTHER): Payer: MEDICAID | Admitting: Obstetrics and Gynecology

## 2023-05-02 VITALS — BP 112/73 | HR 104 | Wt 199.6 lb

## 2023-05-02 DIAGNOSIS — D649 Anemia, unspecified: Secondary | ICD-10-CM

## 2023-05-02 DIAGNOSIS — Z3480 Encounter for supervision of other normal pregnancy, unspecified trimester: Secondary | ICD-10-CM

## 2023-05-02 DIAGNOSIS — E669 Obesity, unspecified: Secondary | ICD-10-CM

## 2023-05-02 DIAGNOSIS — O09899 Supervision of other high risk pregnancies, unspecified trimester: Secondary | ICD-10-CM

## 2023-05-02 DIAGNOSIS — Z8719 Personal history of other diseases of the digestive system: Secondary | ICD-10-CM

## 2023-05-02 DIAGNOSIS — Z3A38 38 weeks gestation of pregnancy: Secondary | ICD-10-CM

## 2023-05-02 DIAGNOSIS — Z9889 Other specified postprocedural states: Secondary | ICD-10-CM

## 2023-05-02 DIAGNOSIS — Z2839 Other underimmunization status: Secondary | ICD-10-CM

## 2023-05-02 NOTE — Progress Notes (Signed)
   Subjective:  Dana Flores is a 22 y.o. [id number] at [redacted]w[redacted]d being seen today for ongoing prenatal care.  She is currently monitored for the following issues for this low-risk pregnancy and has History of umbilical hernia repair; Obesity (BMI 30.0-34.9); Supervision of other normal pregnancy, antepartum; Rubella non-immune status, antepartum; Symptomatic anemia; and Anemia affecting pregnancy in third trimester on their problem list.  Patient reports occasional contractions.  Contractions: Irregular. Vag. Bleeding: None.  Movement: Present. Denies leaking of fluid.   The following portions of the patient's history were reviewed and updated as appropriate: allergies, current medications, past family history, past medical history, past social history, past surgical history and problem list. Problem list updated.  Objective:   Vitals:   05/02/23 1539  BP: 112/73  Pulse: (!) 104  Weight: 199 lb 9.6 oz (90.5 kg)    Fetal Status: Fetal Heart Rate (bpm): 153   Movement: Present     General:  Alert, oriented and cooperative. Patient is in no acute distress.  Skin: Skin is warm and dry. No rash noted.   Cardiovascular: Normal heart rate noted  Respiratory: Normal respiratory effort, no problems with respiration noted  Abdomen: Soft, gravid, appropriate for gestational age. Pain/Pressure: Present     Pelvic: Vag. Bleeding: None     Cervical exam performed Dilation: 1.5 Effacement (%): Thick Station: -3 (anterior, soft)  Extremities: Normal range of motion.  Edema: Trace  Mental Status: Normal mood and affect. Normal behavior. Normal judgment and thought content.    Assessment and Plan:  Pregnancy: A5W0981 at [redacted]w[redacted]d  1. [redacted] weeks gestation of pregnancy 2. Supervision of other normal pregnancy, antepartum Membrane sweep performed today Scheduled for IOL 9/20  3. History of umbilical hernia repair 4. Obesity (BMI 30.0-34.9)  5. Rubella non-immune status, antepartum Recommedned postpartum  MMR  6. Symptomatic anemia S/p 1x iron infusion Continue oral iron  Term labor symptoms and general obstetric precautions including but not limited to vaginal bleeding, contractions, leaking of fluid and fetal movement were reviewed in detail with the patient. Please refer to After Visit Summary for other counseling recommendations.  Return in 1 week (on 05/09/2023). If not delivered.   Wyn Forster, MD FMOB Fellow, Faculty practice [facility], Center for [facility]

## 2023-05-08 ENCOUNTER — Other Ambulatory Visit: Payer: Self-pay

## 2023-05-08 ENCOUNTER — Encounter (HOSPITAL_COMMUNITY): Payer: Self-pay | Admitting: Obstetrics and Gynecology

## 2023-05-08 ENCOUNTER — Inpatient Hospital Stay (HOSPITAL_COMMUNITY)
Admission: RE | Admit: 2023-05-08 | Discharge: 2023-05-11 | DRG: 807 | Disposition: A | Discharge status: Home / Self Care | Payer: MEDICAID | Attending: Family Medicine | Admitting: Family Medicine

## 2023-05-08 DIAGNOSIS — K219 Gastro-esophageal reflux disease without esophagitis: Secondary | ICD-10-CM | POA: Diagnosis present

## 2023-05-08 DIAGNOSIS — O9962 Diseases of the digestive system complicating childbirth: Secondary | ICD-10-CM | POA: Diagnosis present

## 2023-05-08 DIAGNOSIS — Z3A39 39 weeks gestation of pregnancy: Secondary | ICD-10-CM | POA: Diagnosis not present

## 2023-05-08 DIAGNOSIS — D649 Anemia, unspecified: Secondary | ICD-10-CM | POA: Diagnosis present

## 2023-05-08 DIAGNOSIS — E669 Obesity, unspecified: Secondary | ICD-10-CM | POA: Diagnosis present

## 2023-05-08 DIAGNOSIS — O9902 Anemia complicating childbirth: Secondary | ICD-10-CM | POA: Diagnosis present

## 2023-05-08 DIAGNOSIS — O99013 Anemia complicating pregnancy, third trimester: Secondary | ICD-10-CM | POA: Diagnosis present

## 2023-05-08 DIAGNOSIS — O26893 Other specified pregnancy related conditions, third trimester: Secondary | ICD-10-CM | POA: Diagnosis present

## 2023-05-08 DIAGNOSIS — Z87891 Personal history of nicotine dependence: Secondary | ICD-10-CM

## 2023-05-08 DIAGNOSIS — Z3480 Encounter for supervision of other normal pregnancy, unspecified trimester: Secondary | ICD-10-CM

## 2023-05-08 DIAGNOSIS — Z2839 Other underimmunization status: Secondary | ICD-10-CM

## 2023-05-08 DIAGNOSIS — O99214 Obesity complicating childbirth: Secondary | ICD-10-CM | POA: Diagnosis present

## 2023-05-08 DIAGNOSIS — O09899 Supervision of other high risk pregnancies, unspecified trimester: Secondary | ICD-10-CM

## 2023-05-08 DIAGNOSIS — Z8719 Personal history of other diseases of the digestive system: Secondary | ICD-10-CM

## 2023-05-08 LAB — CBC
HCT: 31.9 % — ABNORMAL LOW (ref 36.0–46.0)
Hemoglobin: 9.9 g/dL — ABNORMAL LOW (ref 12.0–15.0)
MCH: 24.2 pg — ABNORMAL LOW (ref 26.0–34.0)
MCHC: 31 g/dL (ref 30.0–36.0)
MCV: 78 fL — ABNORMAL LOW (ref 80.0–100.0)
Platelets: 156 10*3/uL (ref 150–400)
RBC: 4.09 MIL/uL (ref 3.87–5.11)
RDW: 21 % — ABNORMAL HIGH (ref 11.5–15.5)
WBC: 7.1 10*3/uL (ref 4.0–10.5)
nRBC: 0 % (ref 0.0–0.2)

## 2023-05-08 LAB — TYPE AND SCREEN
ABO/RH(D): O POS
Antibody Screen: NEGATIVE

## 2023-05-08 MED ORDER — LIDOCAINE HCL (PF) 1 % IJ SOLN: 30.0000 mL | INTRAMUSCULAR | Status: DC | PRN

## 2023-05-08 MED ORDER — FENTANYL-BUPIVACAINE-NACL 0.5-0.125-0.9 MG/250ML-% EP SOLN
12.0000 mL/h | EPIDURAL | Status: DC | PRN
  Administered 2023-05-09: 12 mL/h via EPIDURAL
  Filled 2023-05-08: qty 250

## 2023-05-08 MED ORDER — PHENYLEPHRINE 80 MCG/ML (10ML) SYRINGE FOR IV PUSH (FOR BLOOD PRESSURE SUPPORT): 80.0000 ug | PREFILLED_SYRINGE | INTRAVENOUS | Status: DC | PRN

## 2023-05-08 MED ORDER — FENTANYL CITRATE (PF) 100 MCG/2ML IJ SOLN
50.0000 ug | INTRAMUSCULAR | Status: DC | PRN
  Administered 2023-05-09: 100 ug via INTRAVENOUS
  Filled 2023-05-08: qty 2

## 2023-05-08 MED ORDER — ACETAMINOPHEN 325 MG PO TABS
650.0000 mg | ORAL_TABLET | ORAL | Status: DC | PRN
  Administered 2023-05-09: 650 mg via ORAL
  Filled 2023-05-08: qty 2

## 2023-05-08 MED ORDER — TERBUTALINE SULFATE 1 MG/ML IJ SOLN: 0.2500 mg | Freq: Once | INTRAMUSCULAR | Status: DC | PRN

## 2023-05-08 MED ORDER — OXYTOCIN BOLUS FROM INFUSION
333.0000 mL | Freq: Once | INTRAVENOUS | Status: DC
  Administered 2023-05-09: 333 mL via INTRAVENOUS

## 2023-05-08 MED ORDER — OXYCODONE-ACETAMINOPHEN 5-325 MG PO TABS: 1.0000 | ORAL_TABLET | ORAL | Status: DC | PRN

## 2023-05-08 MED ORDER — DIPHENHYDRAMINE HCL 50 MG/ML IJ SOLN: 12.5000 mg | INTRAMUSCULAR | Status: DC | PRN

## 2023-05-08 MED ORDER — OXYTOCIN-SODIUM CHLORIDE 30-0.9 UT/500ML-% IV SOLN
1.0000 m[IU]/min | INTRAVENOUS | Status: DC
  Administered 2023-05-08: 2 m[IU]/min via INTRAVENOUS
  Filled 2023-05-08: qty 500

## 2023-05-08 MED ORDER — SOD CITRATE-CITRIC ACID 500-334 MG/5ML PO SOLN
30.0000 mL | ORAL | Status: DC | PRN
  Administered 2023-05-08: 30 mL via ORAL
  Filled 2023-05-08: qty 30

## 2023-05-08 MED ORDER — OXYTOCIN-SODIUM CHLORIDE 30-0.9 UT/500ML-% IV SOLN
2.5000 [IU]/h | INTRAVENOUS | Status: DC
  Administered 2023-05-09: 2.5 [IU]/h via INTRAVENOUS

## 2023-05-08 MED ORDER — EPHEDRINE 5 MG/ML INJ: 10.0000 mg | INTRAVENOUS | Status: DC | PRN

## 2023-05-08 MED ORDER — LACTATED RINGERS IV SOLN: 500.0000 mL | INTRAVENOUS | Status: DC | PRN

## 2023-05-08 MED ORDER — PHENYLEPHRINE 80 MCG/ML (10ML) SYRINGE FOR IV PUSH (FOR BLOOD PRESSURE SUPPORT)
80.0000 ug | PREFILLED_SYRINGE | INTRAVENOUS | Status: DC | PRN
  Filled 2023-05-08: qty 10

## 2023-05-08 MED ORDER — LACTATED RINGERS IV SOLN
500.0000 mL | Freq: Once | INTRAVENOUS | Status: AC
  Administered 2023-05-09: 500 mL via INTRAVENOUS

## 2023-05-08 MED ORDER — ONDANSETRON HCL 4 MG/2ML IJ SOLN: 4.0000 mg | Freq: Four times a day (QID) | INTRAMUSCULAR | Status: DC | PRN

## 2023-05-08 MED ORDER — OXYCODONE-ACETAMINOPHEN 5-325 MG PO TABS: 2.0000 | ORAL_TABLET | ORAL | Status: DC | PRN

## 2023-05-08 MED ORDER — LACTATED RINGERS IV SOLN: INTRAVENOUS | Status: DC

## 2023-05-08 NOTE — Discharge Summary (Signed)
Postpartum Discharge Summary  Date of Service updated***     Patient Name: Dana Flores DOB: 2000-12-17 MRN: [id number]  Date of admission: 05/08/2023 Delivery date:This patient has no babies on file. Delivering provider: This patient has no babies on file. Date of discharge: 05/08/2023  Admitting diagnosis: [redacted] weeks gestation of pregnancy [Z3A.39] Intrauterine pregnancy: [redacted]w[redacted]d     Secondary diagnosis:  Principal Problem:   [redacted] weeks gestation of pregnancy  Additional problems: ***    Discharge diagnosis: {DX.:23714}                                              Post partum procedures:{Postpartum procedures:23558} Augmentation: {Augmentation:20782} Complications: {OB Labor/Delivery Complications:20784}  Hospital course: {Courses:23701}  Magnesium Sulfate received: {Mag received:30440022} BMZ received: {BMZ received:30440023} Rhophylac:{Rhophylac received:30440032} AOZ:{HYQ:65784696} T-DaP:{Tdap:23962} Flu: {EXB:28413} RSV Vaccine received: {RSV:31013} Transfusion:{Transfusion received:30440034}  Immunizations received: Immunization History  Administered Date(s) Administered   Influenza,inj,Quad PF,6+ Mos 07/04/2018, 07/29/2020   Tdap 07/04/2018, 07/29/2020    Physical exam  Vitals:   05/08/23 1921 05/08/23 2000  BP:  98/61  Pulse:  85  Resp:  18  Temp:  97.8 F (36.6 C)  TempSrc:  Oral  Weight: 90.9 kg 90.9 kg  Height: 4\' 11"  (1.499 m) 4\' 11"  (1.499 m)   General: {Exam; general:21111117} Lochia: {Desc; appropriate/inappropriate:30686::"appropriate"} Uterine Fundus: {Desc; firm/soft:30687} Incision: {Exam; incision:21111123} DVT Evaluation: {Exam; dvt:2111122} Labs: Lab Results  Component Value Date   WBC 8.9 02/14/2023   HGB 9.6 (L) 02/14/2023   HCT 29.8 (L) 02/14/2023   MCV 81 02/14/2023   PLT 209 02/14/2023      Latest Ref Rng & Units 10/18/2020   11:25 PM  CMP  Glucose 70 - 99 mg/dL 82   BUN 6 - 20 mg/dL 8   Creatinine 2.44 - 0.10 mg/dL  2.72   Sodium 536 - 644 mmol/L 136   Potassium 3.5 - 5.1 mmol/L 3.3   Chloride 98 - 111 mmol/L 107   CO2 22 - 32 mmol/L 17   Calcium 8.9 - 10.3 mg/dL 8.9    Edinburgh Score:    11/23/2020   11:24 AM  Edinburgh Postnatal Depression Scale Screening Tool  I have been able to laugh and see the funny side of things. 1  I have looked forward with enjoyment to things. 1  I have blamed myself unnecessarily when things went wrong. 2  I have been anxious or worried for no good reason. 3  I have felt scared or panicky for no good reason. 2  Things have been getting on top of me. 2  I have been so unhappy that I have had difficulty sleeping. 3  I have felt sad or miserable. 2  I have been so unhappy that I have been crying. 3  The thought of harming myself has occurred to me. 0  Edinburgh Postnatal Depression Scale Total 19   No data recorded  After visit meds:  Allergies as of 05/08/2023   No Known Allergies   Med Rec must be completed prior to using this [facility]***        Discharge home in stable condition Infant Feeding: {Baby feeding:23562} Infant Disposition:{CHL IP OB HOME WITH IHKVQQ:59563} Discharge instruction: per After Visit Summary and Postpartum booklet. Activity: Advance as tolerated. Pelvic rest for 6 weeks.  Diet: {OB OVFI:43329518} Future Appointments: Future Appointments  Date Time  Provider Department Center  05/09/2023  3:30 PM Leftwich-Kirby, Wilmer Floor, CNM [facility] None  05/16/2023  3:30 PM Hessie Dibble, MD [facility] None   Follow up Visit:   Please schedule this patient for a In person postpartum visit in 6 weeks with the following provider: Any provider. Additional Postpartum F/U: Hg check    Low risk pregnancy complicated by:  none Delivery mode:  This patient has no babies on file. Anticipated Birth Control:  POPs   05/08/2023 Hal Morales, MD

## 2023-05-08 NOTE — H&P (Signed)
OBSTETRIC ADMISSION HISTORY AND PHYSICAL  Dana Flores is a 22 y.o. female [phone] with IUP at [redacted]w[redacted]d by LMP presenting for eIOL. She reports +FMs, No LOF, no VB, no blurry vision, headaches or peripheral edema, and RUQ pain.  She plans on breast feeding. She request POPs for birth control. She received her prenatal care at  [facility]    Dating: By LMP --->  Estimated Date of Delivery: 05/12/23  Sono:    @[redacted]w[redacted]d , CWD, normal anatomy, cephalic presentation, posterior placental lie, Est. FW: 289 gm 68 %    Prenatal History/Complications:  Symptomatic anemia   Past Medical History:  Nursing Staff Provider  Office Location [facility] Dating  05/12/2023, by Last Menstrual Period  [facility] Model [x]  Traditional [ ]  Centering [ ]  Mom-Baby Dyad      Language  English Anatomy US  Normal  Flu Vaccine   No 2023/24 Genetic/Carrier Screen  NIPS: Low-Risk AFP:   neg Horizon:  TDaP Vaccine   Declines 7/16 Hgb A1C or  GTT Early  Third trimester   COVID Vaccine  No   LAB RESULTS   Rhogam  O/Positive/-- (02/05 1025)  Blood Type O/Positive/-- (02/05 1025)   Baby Feeding Plan  Breast Antibody Negative (02/05 1025)  Contraception  Pill Rubella <0.90 (02/05 1025) NON-IMMUNE  Circumcision  Yes if female RPR Non Reactive (02/05 1025)   Pediatrician   [facility] HBsAg Negative (02/05 1025)   Support Person  Sheliah Mends HCVAb Non Reactive (02/05 1025)   Prenatal Classes   HIV Non Reactive (02/05 1025)     BTL Consent   GBS (For PCN allergy, check sensitivities)   VBAC Consent   Pap       Diagnosis  Date Value Ref Range Status  10/19/2022          - Negative for intraepithelial lesion or malignancy (NILM)             DME Rx [X]  BP cuff [ ]  Weight Scale Waterbirth  [ ]  Class [ ]  Consent [ ]  CNM visit  PHQ9 & GAD7 Arly.Keller ] new OB [  ] 28 weeks  [  ] 36 weeks Induction  [ ]  Orders Entered [ ] Foley Y/N   Past Medical History:  Diagnosis Date   Anemia    Anemia in pregnancy, third trimester 07/31/2020   HGB 8  at 27 weeks - needs iron infusion scheduled  Hgb 12.0 at 36 weeks   Constipation    Postpartum anxiety 10/01/2018   Postpartum depression 10/01/2018   Umbilical hernia     Past Surgical History: Past Surgical History:  Procedure Laterality Date   TOOTH EXTRACTION  2014   UMBILICAL HERNIA REPAIR      Obstetrical History: OB History     Gravida  6   Para  2   Term  2   Preterm  0   AB  3   Living  2      SAB  0   IAB  3   Ectopic  0   Multiple  0   Live Births  2           Social History Social History   Socioeconomic History   Marital status: Significant Other    Spouse name: Not on file   Number of children: Not on file   Years of education: Not on file   Highest education level: Not on file  Occupational History   Not on file  Tobacco Use   Smoking status: Former    Types: E-cigarettes    Quit date: 01/22/2020    Years since quitting: 3.2    Passive exposure: Never   Smokeless tobacco: Never   Tobacco comments:    last used a month ago  Vaping Use   Vaping status: Never Used  Substance and Sexual Activity   Alcohol use: Not Currently    Comment: last drink was a month ago   Drug use: Not Currently    Comment: pt. states she hasn't used since 2017   Sexual activity: Not Currently    Partners: Male    Birth control/protection: None  Other Topics Concern   Not on file  Social History Narrative   Not on file   Social Determinants of Health   Financial Resource Strain: Low Risk  (08/21/2018)   Overall Financial Resource Strain (CARDIA)    Difficulty of Paying Living Expenses: Not hard at all  Food Insecurity: No Food Insecurity (05/08/2023)   Hunger Vital Sign    Worried About Running Out of Food in the Last Year: Never true    Ran Out of Food in the Last Year: Never true  Transportation Needs: No Transportation Needs (05/08/2023)   PRAPARE - Administrator, Civil Service (Medical): No    Lack of Transportation  (Non-Medical): No  Physical Activity: Not on file  Stress: No Stress Concern Present (08/21/2018)   [employer] of Occupational Health - Occupational Stress Questionnaire    Feeling of Stress : Only a little  Social Connections: Not on file    Family History: Family History  Problem Relation Age of Onset   ADD / ADHD Father    Alcohol abuse Father    Asthma Father    Drug abuse Father    Hypertension Father    ADD / ADHD Brother    Anxiety disorder Brother    Asthma Brother    Heart disease Maternal Grandmother    Obesity Maternal Grandmother    Early death Maternal Grandmother    Diabetes Paternal Grandmother    Hyperlipidemia Paternal Grandmother    Obesity Paternal Grandmother    Stroke Paternal Grandfather     Allergies: No Known Allergies  Medications Prior to Admission  Medication Sig Dispense Refill Last Dose   calcium carbonate (TUMS - DOSED IN MG ELEMENTAL CALCIUM) 500 MG chewable tablet Chew 1 tablet by mouth daily.   05/08/2023 at 1500   fluconazole (DIFLUCAN) 150 MG tablet Take one tablet now and one tablet in 3 days. 2 tablet 0 Past Month   nystatin (MYCOSTATIN/NYSTOP) powder Apply 1 Application topically 3 (three) times daily. 15 g 0 Past Month   Prenatal Vit-Fe Phos-FA-Omega (VITAFOL GUMMIES) 3.33-0.333-34.8 MG CHEW Chew 1 tablet by mouth daily. 90 tablet 12 05/07/2023 at 0800   aspirin 81 MG chewable tablet Chew 1 tablet (81 mg total) by mouth daily. (Patient not taking: Reported on 02/28/2023) 30 tablet 6    Blood Pressure Monitoring (BLOOD PRESSURE KIT) DEVI 1 kit by Does not apply route once a week. (Patient not taking: Reported on 11/16/2022) 1 each 0    Blood Pressure Monitoring (BLOOD PRESSURE KIT) DEVI 1 Device by Does not apply route once a week. (Patient not taking: Reported on 11/16/2022) 1 each 0    famotidine (PEPCID) 20 MG tablet Take 1 tablet (20 mg total) by mouth 2 (two) times daily. (Patient not taking: Reported on 02/14/2023) 60 tablet 3  ferrous sulfate 325 (65 FE) MG tablet Take 1 tablet (325 mg total) by mouth every other day. (Patient not taking: Reported on 03/14/2023) 30 tablet 3    Misc. Devices (DIGITAL GLASS SCALE) MISC 1 Device by Does not apply route as directed. (Patient not taking: Reported on 11/16/2022) 1 each 0      Review of Systems   All systems reviewed and negative except as stated in HPI  Blood pressure 98/61, pulse 85, temperature 97.8 F (36.6 C), temperature source Oral, resp. rate 18, height 4\' 11"  (1.499 m), weight 90.9 kg, last menstrual period 08/05/2022, currently breastfeeding. General appearance: alert, cooperative, and no distress Lungs: clear to auscultation bilaterally Heart: regular rate and rhythm Abdomen: soft, non-tender; bowel sounds normal Extremities: Homans sign is negative, no sign of DVT Presentation: unsure Fetal monitoringBaseline: 165 bpm, Variability: Good {> 6 bpm), Accelerations: Reactive, and Decelerations: Absent Uterine activity Frequency: Every 5-10 minutes     Prenatal labs: ABO, Rh: --/--/PENDING (09/23 2027) Antibody: PENDING (09/23 2027) Rubella: <0.90 (02/05 1025) RPR: Non Reactive (07/02 0812)  HBsAg: Negative (02/05 1025)  HIV: Non Reactive (07/02 0812)  GBS: Negative/-- (08/27 1730)  1 hr Glucola wnl Genetic screening  wnl Anatomy US wnl  Prenatal Transfer Tool  Maternal Diabetes: No Genetic Screening: Normal Maternal Ultrasounds/Referrals: Normal Fetal Ultrasounds or other Referrals:  None Maternal Substance Abuse:  No Significant Maternal Medications:  None Significant Maternal Lab Results:  Group B Strep negative Number of Prenatal Visits:greater than 3 verified prenatal visits Other Comments:  None  Results for orders placed or performed during the hospital encounter of 05/08/23 (from the past 24 hour(s))  CBC   Collection Time: 05/08/23  8:27 PM  Result Value Ref Range   WBC 7.1 4.0 - 10.5 K/uL   RBC 4.09 3.87 - 5.11 MIL/uL   Hemoglobin  9.9 (L) 12.0 - 15.0 g/dL   HCT 95.6 (L) 38.7 - 56.4 %   MCV 78.0 (L) 80.0 - 100.0 fL   MCH 24.2 (L) 26.0 - 34.0 pg   MCHC 31.0 30.0 - 36.0 g/dL   RDW 33.2 (H) 95.1 - 88.4 %   Platelets 156 150 - 400 K/uL   nRBC 0.0 0.0 - 0.2 %  Type and screen [facility]   Collection Time: 05/08/23  8:27 PM  Result Value Ref Range   ABO/RH(D) PENDING    Antibody Screen PENDING    Sample Expiration      05/11/2023,2359 Performed at [facility], [address] [place], [place] [address]     Patient Active Problem List   Diagnosis Date Noted   [redacted] weeks gestation of pregnancy 05/08/2023   Symptomatic anemia 04/11/2023   Anemia affecting pregnancy in third trimester 04/11/2023   Rubella non-immune status, antepartum 09/23/2022   Supervision of other normal pregnancy, antepartum 09/21/2022   Obesity (BMI 30.0-34.9) 04/03/2018   History of umbilical hernia repair 02/05/2018    Assessment/Plan:  Rhonda YARETZIE NERISON is a 22 y.o. [id number] at [redacted]w[redacted]d here for eIOL.   #Labor: Patient dilated to 1.5 in office. Will check and augment labor as needed #Pain: Tylenol > IV pain meds > epidural  #FWB: Cat 1  #ID: GBS neg #MOF: Breast #MOC: POPs #Circ:  yes  Hal Morales, MD  05/08/2023, 8:52 PM

## 2023-05-09 ENCOUNTER — Inpatient Hospital Stay (HOSPITAL_COMMUNITY): Payer: MEDICAID | Admitting: Anesthesiology

## 2023-05-09 ENCOUNTER — Encounter: Payer: MEDICAID | Admitting: Advanced Practice Midwife

## 2023-05-09 ENCOUNTER — Encounter (HOSPITAL_COMMUNITY): Payer: Self-pay | Admitting: Obstetrics and Gynecology

## 2023-05-09 DIAGNOSIS — Z3A39 39 weeks gestation of pregnancy: Secondary | ICD-10-CM | POA: Diagnosis not present

## 2023-05-09 LAB — RPR: RPR Ser Ql: NONREACTIVE

## 2023-05-09 MED ORDER — SODIUM CHLORIDE 0.9 % IV BOLUS: 500.0000 mL | Freq: Once | INTRAVENOUS | Status: DC | PRN

## 2023-05-09 MED ORDER — SODIUM CHLORIDE 0.9 % IV SOLN: INTRAVENOUS | Status: DC | PRN

## 2023-05-09 MED ORDER — ACETAMINOPHEN 500 MG PO TABS
1000.0000 mg | ORAL_TABLET | Freq: Three times a day (TID) | ORAL | Status: DC
  Administered 2023-05-09 – 2023-05-10 (×4): 1000 mg via ORAL
  Filled 2023-05-09 (×6): qty 2

## 2023-05-09 MED ORDER — ALBUTEROL SULFATE (2.5 MG/3ML) 0.083% IN NEBU: 2.5000 mg | INHALATION_SOLUTION | Freq: Once | RESPIRATORY_TRACT | Status: DC | PRN

## 2023-05-09 MED ORDER — ONDANSETRON HCL 4 MG/2ML IJ SOLN: 4.0000 mg | INTRAMUSCULAR | Status: DC | PRN

## 2023-05-09 MED ORDER — SENNOSIDES-DOCUSATE SODIUM 8.6-50 MG PO TABS
2.0000 | ORAL_TABLET | Freq: Every day | ORAL | Status: DC
  Administered 2023-05-10 – 2023-05-11 (×2): 2 via ORAL
  Filled 2023-05-09 (×2): qty 2

## 2023-05-09 MED ORDER — ZOLPIDEM TARTRATE 5 MG PO TABS: 5.0000 mg | ORAL_TABLET | Freq: Every evening | ORAL | Status: DC | PRN

## 2023-05-09 MED ORDER — MEASLES, MUMPS & RUBELLA VAC IJ SOLR
0.5000 mL | Freq: Once | INTRAMUSCULAR | Status: DC
  Filled 2023-05-09: qty 0.5

## 2023-05-09 MED ORDER — IRON SUCROSE 500 MG IVPB - SIMPLE MED
500.0000 mg | Freq: Once | INTRAVENOUS | Status: AC
  Administered 2023-05-09: 500 mg via INTRAVENOUS
  Filled 2023-05-09: qty 275

## 2023-05-09 MED ORDER — DIBUCAINE (PERIANAL) 1 % EX OINT: 1.0000 | TOPICAL_OINTMENT | CUTANEOUS | Status: DC | PRN

## 2023-05-09 MED ORDER — COCONUT OIL OIL
1.0000 | TOPICAL_OIL | Status: DC | PRN
  Administered 2023-05-09: 1 via TOPICAL

## 2023-05-09 MED ORDER — PRENATAL MULTIVITAMIN CH
1.0000 | ORAL_TABLET | Freq: Every day | ORAL | Status: DC
  Administered 2023-05-09 – 2023-05-10 (×2): 1 via ORAL
  Filled 2023-05-09 (×3): qty 1

## 2023-05-09 MED ORDER — DIPHENHYDRAMINE HCL 50 MG/ML IJ SOLN: 25.0000 mg | Freq: Once | INTRAMUSCULAR | Status: DC | PRN

## 2023-05-09 MED ORDER — SIMETHICONE 80 MG PO CHEW: 80.0000 mg | CHEWABLE_TABLET | ORAL | Status: DC | PRN

## 2023-05-09 MED ORDER — DIPHENHYDRAMINE HCL 25 MG PO CAPS
25.0000 mg | ORAL_CAPSULE | Freq: Four times a day (QID) | ORAL | Status: DC | PRN
  Administered 2023-05-09: 25 mg via ORAL
  Filled 2023-05-09: qty 1

## 2023-05-09 MED ORDER — WITCH HAZEL-GLYCERIN EX PADS: 1.0000 | MEDICATED_PAD | CUTANEOUS | Status: DC | PRN

## 2023-05-09 MED ORDER — IBUPROFEN 800 MG PO TABS: 800.0000 mg | ORAL_TABLET | Freq: Three times a day (TID) | ORAL | Status: DC

## 2023-05-09 MED ORDER — EPINEPHRINE PF 1 MG/ML IJ SOLN: 0.3000 mg | Freq: Once | INTRAMUSCULAR | Status: DC | PRN

## 2023-05-09 MED ORDER — BENZOCAINE-MENTHOL 20-0.5 % EX AERO: 1.0000 | INHALATION_SPRAY | CUTANEOUS | Status: DC | PRN

## 2023-05-09 MED ORDER — ONDANSETRON HCL 4 MG PO TABS: 4.0000 mg | ORAL_TABLET | ORAL | Status: DC | PRN

## 2023-05-09 MED ORDER — LIDOCAINE HCL (PF) 1 % IJ SOLN
INTRAMUSCULAR | Status: DC | PRN
  Administered 2023-05-09 (×2): 4 mL via EPIDURAL

## 2023-05-09 MED ORDER — METHYLPREDNISOLONE SODIUM SUCC 125 MG IJ SOLR: 125.0000 mg | Freq: Once | INTRAMUSCULAR | Status: DC | PRN

## 2023-05-09 MED ORDER — IBUPROFEN 800 MG PO TABS
800.0000 mg | ORAL_TABLET | Freq: Three times a day (TID) | ORAL | Status: DC
  Administered 2023-05-09 – 2023-05-11 (×6): 800 mg via ORAL
  Filled 2023-05-09 (×7): qty 1

## 2023-05-09 NOTE — Progress Notes (Signed)
LABOR PROGRESS NOTE  Dana Flores is a 22 y.o. [phone] at [redacted]w[redacted]d presented for eIOL  S: Feeling pressure  O:  BP 109/66   Pulse 90   Temp 98.7 F (37.1 C) (Oral)   Resp 18   Ht 4\' 11"  (1.499 m)   Wt 90.9 kg   LMP 08/05/2022   SpO2 100%   BMI 40.48 kg/m  EFM:135 bpm/Moderate variability/ 15x15 accels/ Variable decels CAT: 2 Toco: regular, every 1-2 minutes   CVE: Dilation: 8 Effacement (%): 90 Cervical Position: Posterior Station: -1 Presentation: Vertex Exam by:: Dr. Earlene Plater   A&P: 22 y.o. [id number] [redacted]w[redacted]d here for IOL as above  #Labor: Patient amenable to AROM after discussion of risks/benefits and verbal consent obtained. Scant clear fluid. FHT with variables after; reassuringly moderate variability and accels still present between. Progressing quickly towards delivery. #Pain: Epidural #FWB: CAT 2 #GBS negative  Joanne Gavel, MD FMOB Fellow, Faculty practice [facility], Center for [facility] 05/09/23  6:42 AM

## 2023-05-09 NOTE — Lactation Note (Signed)
This note was copied from a baby's chart. Lactation Consultation Note  Patient Name: Dana Flores ZOXWR'U Date: 05/09/2023 Age:24 hours  Reason for consult: Initial assessment;Term;Breastfeeding assistance;Mother's request  P3, [redacted]w[redacted]d  Initial LC visit to see P3 mother of term infant. Upon arrival mother was attempting to latch baby. Mother requesting assistance. Baby has nasal stuffiness. Mother reports left nipple is sore and a small sucking blister is present. Mother was given coconut oil by her nurse and she applied to her nipple. Mother plans to avoid latching on that breast and allow healing, for now.   Basic breastfeeding education with baby in foothold hold and baby aligned to mother's breast. When baby initially latched to breast, mother felt a pinch and discomfort. After a few sucks, she said the latch was comfortable and she only felt tugs. Advised mother to observe nipple after breastfeeding. If nipple appears pinched, more depth is needed. Baby was breathing well with the feeding and less nasal stuffiness was noted.   Mother desires to breast and formula feed. Baby was fed 35 ml by parents due to not latching. Mom referred to it as "an emergency bottle". Parents were educated about recommended volumes to supplement breastfeeding. Supplementation volume guidelines given to parents. Encouraged mother to breastfeed first then supplement, as needed.   Encouraged to latch baby with feeding cues, place baby skin to skin if not latching.  Call for assistance with breastfeeding, as needed.  Anticipate as baby approaches 24 hours of age, baby will feed more often at breast, 8-12 plus times in 24 hours and "cluster feed"      Mom made aware of O/P services, breastfeeding support groups, community resources, and our phone # for post-discharge questions.      Maternal Data Has patient been taught Hand Expression?: Yes Does the patient have breastfeeding experience prior to this  delivery?: Yes How long did the patient breastfeed?: breast fed 2nd child for 4 months, reports having a good milk supply  Feeding Mother's Current Feeding Choice: Breast Milk and Formula Nipple Type: Slow - flow  LATCH Score Latch: Repeated attempts needed to sustain latch, nipple held in mouth throughout feeding, stimulation needed to elicit sucking reflex.  Audible Swallowing: A few with stimulation  Type of Nipple: Everted at rest and after stimulation  Comfort (Breast/Nipple): Soft / non-tender  Hold (Positioning): Assistance needed to correctly position infant at breast and maintain latch.  LATCH Score: 7    Interventions Interventions: Breast feeding basics reviewed;Assisted with latch;Skin to skin;Hand express;Breast compression;Adjust position;Support pillows;Position options;Coconut oil;Education;LC Services brochure   Consult Status Consult Status: Follow-up Date: 05/10/23 Follow-up type: In-patient    Christella Hartigan M 05/09/2023, 3:55 PM

## 2023-05-09 NOTE — Anesthesia Preprocedure Evaluation (Addendum)
Anesthesia Evaluation  Patient identified by MRN, date of birth, ID band Patient awake    Reviewed: Allergy & Precautions, NPO status , Patient's Chart, lab work & pertinent test results  History of Anesthesia Complications Negative for: history of anesthetic complications  Airway Mallampati: III  TM Distance: >3 FB Neck ROM: Full    Dental   Pulmonary former smoker   Pulmonary exam normal breath sounds clear to auscultation       Cardiovascular negative cardio ROS  Rhythm:Regular Rate:Normal     Neuro/Psych  PSYCHIATRIC DISORDERS Anxiety Depression    negative neurological ROS     GI/Hepatic Neg liver ROS,GERD  Medicated,,  Endo/Other  neg diabetes  Morbid obesity  Renal/GU negative Renal ROS     Musculoskeletal   Abdominal  (+) + obese  Peds  Hematology  (+) Blood dyscrasia, anemia Lab Results      Component                Value               Date                      WBC                      7.1                 05/08/2023                HGB                      9.9 (L)             05/08/2023                HCT                      31.9 (L)            05/08/2023                MCV                      78.0 (L)            05/08/2023                PLT                      156                 05/08/2023              Anesthesia Other Findings   Reproductive/Obstetrics (+) Pregnancy                             Anesthesia Physical Anesthesia Plan  ASA: 3  Anesthesia Plan: Epidural   Post-op Pain Management:    Induction:   PONV Risk Score and Plan:   Airway Management Planned:   Additional Equipment:   Intra-op Plan:   Post-operative Plan:   Informed Consent: I have reviewed the patients History and Physical, chart, labs and discussed the procedure including the risks, benefits and alternatives for the proposed anesthesia with the patient or authorized representative who  has indicated his/her understanding and acceptance.       Plan Discussed with:  Anesthesiologist  Anesthesia Plan Comments: (I have discussed risks of neuraxial anesthesia including but not limited to infection, bleeding, nerve injury, back pain, headache, seizures, and failure of block. Patient denies bleeding disorders and is not currently anticoagulated. Labs have been reviewed. Risks and benefits discussed. All patient's questions answered.  )       Anesthesia Quick Evaluation

## 2023-05-09 NOTE — Progress Notes (Signed)
LABOR PROGRESS NOTE  Dana Flores is a 22 y.o. [id number] at [redacted]w[redacted]d presented for eIOL  S: Feeling much better after epidural. Still feeling some pressure.  O:  BP (!) 108/59   Pulse 80   Temp 98.7 F (37.1 C) (Oral)   Resp 18   Ht 4\' 11"  (1.499 m)   Wt 90.9 kg   LMP 08/05/2022   SpO2 98%   BMI 40.48 kg/m  EFM:130 bpm/Moderate variability/ 15x15 accels/ None decels CAT: 1 Toco: regular, every 2 minutes   CVE: Dilation: 4 Effacement (%): 80 Cervical Position: Posterior Station: -3, -2 Presentation: Vertex Exam by:: Mellissa Kohut, RN   A&P: 22 y.o. [id number] [redacted]w[redacted]d here for eIOL as above  #Labor: Progressing well. Patient would like to wait at this time for AROM. Continue to titrate pitocin #Pain: Epidural #FWB: CAT 1 #GBS negative  Joanne Gavel, MD FMOB Fellow, Faculty practice [facility], [facility] 05/09/23  4:24 AM

## 2023-05-09 NOTE — Anesthesia Procedure Notes (Signed)
Epidural Patient location during procedure: OB Start time: 05/09/2023 2:13 AM End time: 05/09/2023 2:18 AM  Staffing Anesthesiologist: Linton Rump, MD Performed: anesthesiologist   Preanesthetic Checklist Completed: patient identified, IV checked, site marked, risks and benefits discussed, surgical consent, monitors and equipment checked, pre-op evaluation and timeout performed  Epidural Patient position: sitting Prep: DuraPrep and site prepped and draped Patient monitoring: continuous pulse ox and blood pressure Approach: midline Location: L3-L4 Injection technique: LOR saline  Needle:  Needle type: Tuohy  Needle gauge: 17 G Needle length: 9 cm and 9 Needle insertion depth: 6 cm Catheter type: closed end flexible Catheter size: 19 Gauge Catheter at skin depth: 10 cm Test dose: negative  Assessment Events: blood not aspirated, no cerebrospinal fluid, injection not painful, no injection resistance, no paresthesia and negative IV test  Additional Notes The patient has requested an epidural for labor pain management. Risks and benefits including, but not limited to, infection, bleeding, local anesthetic toxicity, headache, hypotension, back pain, block failure, etc. were discussed with the patient. The patient expressed understanding and consented to the procedure. I confirmed that the patient has no bleeding disorders and is not taking blood thinners. I confirmed the patient's last platelet count with the nurse. A time-out was performed immediately prior to the procedure. Please see nursing documentation for vital signs. Sterile technique was used throughout the whole procedure. Once LOR achieved, the epidural catheter threaded easily without resistance. Aspiration of the catheter was negative for blood and CSF. The epidural was dosed slowly and an infusion was started.  1 attempt(s)Reason for block:procedure for pain

## 2023-05-10 MED ORDER — ACETAMINOPHEN 500 MG PO TABS
1000.0000 mg | ORAL_TABLET | Freq: Three times a day (TID) | ORAL | Status: DC
  Administered 2023-05-11 (×2): 1000 mg via ORAL
  Filled 2023-05-10 (×2): qty 2

## 2023-05-10 NOTE — Anesthesia Postprocedure Evaluation (Signed)
Anesthesia Post Note  Patient: Dana Flores  Procedure(s) Performed: AN AD HOC LABOR EPIDURAL     Patient location during evaluation: Mother Baby Anesthesia Type: Epidural Level of consciousness: awake and alert and oriented Pain management: satisfactory to patient Vital Signs Assessment: post-procedure vital signs reviewed and stable Respiratory status: respiratory function stable Cardiovascular status: stable Postop Assessment: no headache, no backache, epidural receding, patient able to bend at knees, no signs of nausea or vomiting, adequate PO intake and able to ambulate Anesthetic complications: no   No notable events documented.  Last Vitals:  Vitals:   05/09/23 2240 05/10/23 0525  BP: (!) 112/52 (!) 100/51  Pulse: 70 62  Resp: 16 16  Temp: 36.8 C 37.2 C  SpO2: 98%     Last Pain:  Vitals:   05/10/23 0525  TempSrc: Oral  PainSc: 0-No pain   Pain Goal:                   [name]

## 2023-05-10 NOTE — Progress Notes (Signed)
Post Partum Day 2 Subjective: no complaints  Objective: Blood pressure (!) 100/51, pulse 62, temperature 98.9 F (37.2 C), temperature source Oral, resp. rate 16, height 4\' 11"  (1.499 m), weight 90.9 kg, last menstrual period 08/05/2022, SpO2 98%, unknown if currently breastfeeding.  Physical Exam:  General: alert Lochia: appropriate Uterine Fundus: firm Incision: NA DVT Evaluation: No evidence of DVT seen on physical exam.  Recent Labs    05/08/23 2027  HGB 9.9*  HCT 31.9*    Assessment/Plan: Plan for discharge tomorrow   LOS: 2 days   Hermina Staggers, MD 05/10/2023, 10:20 AM

## 2023-05-10 NOTE — Progress Notes (Signed)
CSW received and acknowledges consult for EDPS of 9.  Consult screened out due to 9 on EDPS does not warrant a CSW consult.  MOB whom scores are greater than 9/yes to question 10 on Edinburgh Postpartum Depression Screen warrants a CSW consult.   Jhalen Eley, LCSWA Clinical Social Worker [phone]  

## 2023-05-10 NOTE — Progress Notes (Signed)
Patient ID: Dana Flores, female   DOB: 07-12-2001, 22 y.o.   MRN: [id number] Attending Circumcision Counseling Progress Note  Patient desires circumcision for her female infant.  Circumcision procedure details discussed, risks and benefits of procedure were also discussed.  These include but are not limited to: Benefits of circumcision in men include reduction in the rates of urinary tract infection (UTI), penile cancer, some sexually transmitted infections, penile inflammatory and retractile disorders, as well as easier hygiene.  Risks include bleeding , infection, injury of glans which may lead to penile deformity or urinary tract issues, unsatisfactory cosmetic appearance and other potential complications related to the procedure.  It was emphasized that this is an elective procedure.  Patient wants to proceed with circumcision; written informed consent obtained.  Will do circumcision soon, routine circumcision and post circumcision care ordered for the infant.  Jaidy Cottam L. Alysia Penna, M.D. 05/10/2023 10:20 AM

## 2023-05-10 NOTE — Plan of Care (Signed)
  Problem: Education: Goal: Knowledge of General Education information will improve Description: Including pain rating scale, medication(s)/side effects and non-pharmacologic comfort measures Outcome: Progressing   Problem: Clinical Measurements: Goal: Ability to maintain clinical measurements within normal limits will improve Outcome: Progressing Goal: Will remain free from infection Outcome: Progressing Goal: Diagnostic test results will improve Outcome: Progressing   Problem: Coping: Goal: Level of anxiety will decrease Outcome: Progressing   Problem: Elimination: Goal: Will not experience complications related to bowel motility Outcome: Progressing   Problem: Pain Managment: Goal: General experience of comfort will improve Outcome: Progressing   Problem: Safety: Goal: Ability to remain free from injury will improve Outcome: Progressing   Problem: Skin Integrity: Goal: Risk for impaired skin integrity will decrease Outcome: Progressing   Problem: Education: Goal: Knowledge of Childbirth will improve Outcome: Progressing Goal: Ability to make informed decisions regarding treatment and plan of care will improve Outcome: Progressing Goal: Ability to state and carry out methods to decrease the pain will improve Outcome: Progressing Goal: Individualized Educational Video(s) Outcome: Progressing   Problem: Coping: Goal: Ability to verbalize concerns and feelings about labor and delivery will improve Outcome: Progressing   Problem: Life Cycle: Goal: Ability to make normal progression through stages of labor will improve Outcome: Progressing Goal: Ability to effectively push during vaginal delivery will improve Outcome: Progressing   Problem: Role Relationship: Goal: Will demonstrate positive interactions with the child Outcome: Progressing   Problem: Safety: Goal: Risk of complications during labor and delivery will decrease Outcome: Progressing   Problem:  Pain Management: Goal: Relief or control of pain from uterine contractions will improve Outcome: Progressing   Problem: Education: Goal: Knowledge of condition will improve Outcome: Progressing Goal: Individualized Educational Video(s) Outcome: Progressing Goal: Individualized Newborn Educational Video(s) Outcome: Progressing   Problem: Activity: Goal: Will verbalize the importance of balancing activity with adequate rest periods Outcome: Progressing Goal: Ability to tolerate increased activity will improve Outcome: Progressing   Problem: Coping: Goal: Ability to identify and utilize available resources and services will improve Outcome: Progressing   Problem: Life Cycle: Goal: Chance of risk for complications during the postpartum period will decrease Outcome: Progressing   Problem: Role Relationship: Goal: Ability to demonstrate positive interaction with newborn will improve Outcome: Progressing   Problem: Skin Integrity: Goal: Demonstration of wound healing without infection will improve Outcome: Progressing

## 2023-05-11 MED ORDER — IBUPROFEN 800 MG PO TABS: 800.0000 mg | ORAL_TABLET | Freq: Three times a day (TID) | ORAL | 0 refills | Status: DC | PRN

## 2023-05-11 NOTE — Plan of Care (Signed)
  Problem: Education: Goal: Knowledge of Childbirth will improve 05/11/2023 1140 by Donne Hazel, LPN Outcome: Adequate for Discharge 05/11/2023 0841 by Donne Hazel, LPN Outcome: Progressing Goal: Ability to make informed decisions regarding treatment and plan of care will improve 05/11/2023 1140 by Donne Hazel, LPN Outcome: Adequate for Discharge 05/11/2023 0841 by Donne Hazel, LPN Outcome: Progressing Goal: Ability to state and carry out methods to decrease the pain will improve 05/11/2023 1140 by Donne Hazel, LPN Outcome: Adequate for Discharge 05/11/2023 0841 by Donne Hazel, LPN Outcome: Progressing Goal: Individualized Educational Video(s) 05/11/2023 1140 by Donne Hazel, LPN Outcome: Adequate for Discharge 05/11/2023 0841 by Donne Hazel, LPN Outcome: Progressing   Problem: Coping: Goal: Ability to verbalize concerns and feelings about labor and delivery will improve 05/11/2023 1140 by Donne Hazel, LPN Outcome: Adequate for Discharge 05/11/2023 0841 by Donne Hazel, LPN Outcome: Progressing   Problem: Life Cycle: Goal: Ability to make normal progression through stages of labor will improve 05/11/2023 1140 by Donne Hazel, LPN Outcome: Adequate for Discharge 05/11/2023 0841 by Donne Hazel, LPN Outcome: Progressing Goal: Ability to effectively push during vaginal delivery will improve 05/11/2023 1140 by Donne Hazel, LPN Outcome: Adequate for Discharge 05/11/2023 0841 by Donne Hazel, LPN Outcome: Progressing   Problem: Role Relationship: Goal: Will demonstrate positive interactions with the child 05/11/2023 1140 by Donne Hazel, LPN Outcome: Adequate for Discharge 05/11/2023 0841 by Donne Hazel, LPN Outcome: Progressing   Problem: Safety: Goal: Risk of complications during labor and delivery will decrease 05/11/2023 1140 by Donne Hazel, LPN Outcome: Adequate for Discharge 05/11/2023 0841 by Donne Hazel, LPN Outcome: Progressing    Problem: Pain Management: Goal: Relief or control of pain from uterine contractions will improve 05/11/2023 1140 by Donne Hazel, LPN Outcome: Adequate for Discharge 05/11/2023 0841 by Donne Hazel, LPN Outcome: Progressing   Problem: Education: Goal: Individualized Educational Video(s) 05/11/2023 1140 by Donne Hazel, LPN Outcome: Adequate for Discharge 05/11/2023 0841 by Donne Hazel, LPN Outcome: Progressing Goal: Individualized Newborn Educational Video(s) 05/11/2023 1140 by Donne Hazel, LPN Outcome: Adequate for Discharge 05/11/2023 0841 by Donne Hazel, LPN Outcome: Progressing

## 2023-05-11 NOTE — Progress Notes (Signed)
CSW received consult for hx of Anxiety and Depression.  CSW met with MOB to offer support and complete assessment. CSW entered the room, introduced herself and acknowledged that FOB was present. MOB gave CSW verbal permission to speak about anything while FOB was present. CSW explained her role and the reason for the visit. MOB presented as calm, was agreeable to consult and remained engaged throughout encounter.  CSW inquired about MOB's mental health history. 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 MOB denied experiencing any mental health prior to pregnancy. MOB reported reaching out to her OB/PCP for support and she began on mood stabilizers and weekly visits with a therapist. MOB reported her second pregnancy was smooth, she did not experience any signs/symptoms with PPD and she had more support. MOB her supports as her mom, FOB and grandparents. MOB reported currently feeling "good" and bonded well with the infant. CSW provided education regarding the baby blues period vs. perinatal mood disorders, discussed treatment and gave resources for mental health follow up if concerns arise.  CSW recommends self-evaluation during the postpartum time period using the New Mom Checklist from Postpartum Progress and encouraged MOB to contact a medical professional if symptoms are noted at any time. CSW assessed for safety with MOB SI and HI; MOB denied all. CSW did not assess for DV; FOB was present.  CSW asked MOB has she selected a pediatrician for the infant's follow up visits; MOB said [facility].  MOB reported having all essential items for the infant including a carseat, bassinet and crib for safe sleeping. CSW provided review of Sudden Infant Death Syndrome (SIDS) precautions.     CSW identifies no further need for intervention and no barriers to discharge at this time.  Enos Fling,  Theresia Majors Clinical Social Worker [phone]

## 2023-05-11 NOTE — Plan of Care (Signed)
  Problem: Education: Goal: Knowledge of Childbirth will improve Outcome: Progressing Goal: Ability to make informed decisions regarding treatment and plan of care will improve Outcome: Progressing Goal: Ability to state and carry out methods to decrease the pain will improve Outcome: Progressing Goal: Individualized Educational Video(s) Outcome: Progressing   Problem: Coping: Goal: Ability to verbalize concerns and feelings about labor and delivery will improve Outcome: Progressing   Problem: Life Cycle: Goal: Ability to make normal progression through stages of labor will improve Outcome: Progressing Goal: Ability to effectively push during vaginal delivery will improve Outcome: Progressing   Problem: Role Relationship: Goal: Will demonstrate positive interactions with the child Outcome: Progressing   Problem: Safety: Goal: Risk of complications during labor and delivery will decrease Outcome: Progressing   Problem: Pain Management: Goal: Relief or control of pain from uterine contractions will improve Outcome: Progressing   Problem: Education: Goal: Individualized Educational Video(s) Outcome: Progressing Goal: Individualized Newborn Educational Video(s) Outcome: Progressing

## 2023-05-11 NOTE — Discharge Instructions (Signed)
WHAT TO LOOK OUT FOR: Fever of 100.4 or above Mastitis: feels like flu and breasts hurt Infection: increased pain, swelling or redness Blood clots golf ball size or larger Postpartum depression   Congratulations on your newest addition!

## 2023-05-11 NOTE — Lactation Note (Signed)
This note was copied from a baby's chart. Lactation Consultation Note  Patient Name: Dana Flores ZOXWR'U Date: 05/11/2023 Age:24 hours Reason for consult: Follow-up assessment;Term;Nipple pain/trauma  P3- LC entered room, but MOB was still sleeping.   Feeding Mother's Current Feeding Choice: Breast Milk and Formula Nipple Type: Slow - flow  Consult Status Consult Status: Follow-up Date: 05/11/23 Follow-up type: In-patient    Dema Severin BS, IBCLC 05/11/2023, 8:27 AM

## 2023-05-16 ENCOUNTER — Encounter: Payer: MEDICAID | Admitting: Family Medicine

## 2023-06-06 ENCOUNTER — Telehealth (HOSPITAL_COMMUNITY): Payer: Self-pay | Admitting: *Deleted

## 2023-06-06 DIAGNOSIS — Z1331 Encounter for screening for depression: Secondary | ICD-10-CM

## 2023-06-06 NOTE — Telephone Encounter (Signed)
06/06/2023  Name: Dana Flores MRN: [id number] DOB: 11/08/2000  Reason for Call:  Transition of Care Hospital Discharge Call  Contact Status: Patient Contact Status: Complete  Language assistant needed: Interpreter Mode: Interpreter Not Needed        Follow-Up Questions: Do You Have Any Concerns About Your Health As You Heal From Delivery?: No Do You Have Any Concerns About Your Infants Health?: No  Edinburgh Postnatal Depression Scale:  In the Past 7 Days: I have been able to laugh and see the funny side of things.: Definitely not so much now I have looked forward with enjoyment to things.: Rather less than I used to I have blamed myself unnecessarily when things went wrong.: Yes, some of the time I have been anxious or worried for no good reason.: Yes, very often I have felt scared or panicky for no good reason.: No, not much Things have been getting on top of me.: Yes, sometimes I haven't been coping as well as usual I have been so unhappy that I have had difficulty sleeping.: Yes, sometimes I have felt sad or miserable.: Yes, most of the time I have been so unhappy that I have been crying.: Yes, quite often The thought of harming myself has occurred to me.: Hardly ever [place] Postnatal Depression Scale Total: (!) 19  PHQ2-9 Depression Scale:     Discharge Follow-up: Edinburgh score requires follow up?: Yes Provider notified of Edinburgh score?: Yes Have you already been referred for a counseling appointment?: No Patient was advised of the following resources:: Support Group, Breastfeeding Support Group  Post-discharge interventions: Reviewed Newborn Safe Sleep Practices Maternal Mental Health Resources provided Placed [facility] referral in patient's chart  Salena Saner, RN 06/06/2023 10:36

## 2023-06-20 ENCOUNTER — Encounter: Payer: Self-pay | Admitting: Advanced Practice Midwife

## 2023-06-20 ENCOUNTER — Ambulatory Visit: Payer: MEDICAID | Admitting: Advanced Practice Midwife

## 2023-06-20 VITALS — BP 114/74 | HR 99 | Ht 59.0 in | Wt 183.6 lb

## 2023-06-20 DIAGNOSIS — Z3009 Encounter for other general counseling and advice on contraception: Secondary | ICD-10-CM | POA: Diagnosis not present

## 2023-06-20 DIAGNOSIS — Z3040 Encounter for surveillance of contraceptives, unspecified: Secondary | ICD-10-CM

## 2023-06-20 DIAGNOSIS — Z30430 Encounter for insertion of intrauterine contraceptive device: Secondary | ICD-10-CM

## 2023-06-20 DIAGNOSIS — F53 Postpartum depression: Secondary | ICD-10-CM | POA: Diagnosis not present

## 2023-06-20 DIAGNOSIS — Z3202 Encounter for pregnancy test, result negative: Secondary | ICD-10-CM | POA: Diagnosis not present

## 2023-06-20 LAB — POCT URINE PREGNANCY: Preg Test, Ur: NEGATIVE

## 2023-06-20 MED ORDER — SERTRALINE HCL 50 MG PO TABS
50.0000 mg | ORAL_TABLET | Freq: Every day | ORAL | 2 refills | Status: AC
Start: 2023-06-20 — End: ?

## 2023-06-20 MED ORDER — PARAGARD INTRAUTERINE COPPER IU IUD
1.0000 | INTRAUTERINE_SYSTEM | Freq: Once | INTRAUTERINE | Status: AC
Start: 2023-06-20 — End: 2023-06-20
  Administered 2023-06-20: 1 via INTRAUTERINE

## 2023-06-20 NOTE — Progress Notes (Signed)
Post Partum Visit Note  Dana Flores is a 22 y.o. [phone] female who presents for a postpartum visit. She is 6 weeks postpartum following a normal spontaneous vaginal delivery.  I have fully reviewed the prenatal and intrapartum course. The delivery was at 39 gestational weeks.  Anesthesia: epidural. Postpartum course has been rough, hardly any help at home. Baby is doing well yes. Baby is feeding by both breast and bottle - Similac Advance. Bleeding clear. Bowel function is normal. Bladder function is normal. Patient is not sexually active. Contraception method is  wants IUD . Postpartum depression screening: positive.   The pregnancy intention screening data noted above was reviewed. Potential methods of contraception were discussed. The patient elected to proceed with No data recorded.   Edinburgh Postnatal Depression Scale - 06/20/23 1515       Edinburgh Postnatal Depression Scale:  In the Past 7 Days   I have been able to laugh and see the funny side of things. 1    I have looked forward with enjoyment to things. 2    I have blamed myself unnecessarily when things went wrong. 2    I have been anxious or worried for no good reason. 2    I have felt scared or panicky for no good reason. 2    Things have been getting on top of me. 3    I have been so unhappy that I have had difficulty sleeping. 3    I have felt sad or miserable. 2    I have been so unhappy that I have been crying. 1    The thought of harming myself has occurred to me. 2    Edinburgh Postnatal Depression Scale Total 20             Health Maintenance Due  Topic Date Due   HPV VACCINES (1 - 3-dose series) Never done   INFLUENZA VACCINE  03/16/2023   COVID-19 Vaccine (1 - 2023-24 season) Never done    The following portions of the patient's history were reviewed and updated as appropriate: allergies, current medications, past family history, past medical history, past social history, past surgical history,  and problem list.  Review of Systems Pertinent items noted in HPI and remainder of comprehensive ROS otherwise negative.  Objective:  BP 114/74   Pulse 99   Ht 4\' 11"  (1.499 m)   Wt 183 lb 9.6 oz (83.3 kg)   LMP 08/05/2022   Breastfeeding Yes   BMI 37.08 kg/m    VS reviewed, nursing note reviewed,  Constitutional: well developed, well nourished, no distress HEENT: normocephalic CV: normal rate Pulm/chest wall: normal effort Abdomen: soft Neuro: alert and oriented x 3 Skin: warm, dry Psych: affect normal   1. Encounter for surveillance of contraceptive device --UPT negative - POCT urine pregnancy  2. Postpartum care following vaginal delivery --Doing better when she gets sleep but not sleeping much. Tries to get baby to nap when her other kids are at school/daycare but he does not always sleep.  --Needs to go back to work for financial reasons, works from home, but is worried about it.   --Pt sent MyChart message with paperwork for medical leave, we will adjust dates from leave date of 04/28/23 to 06/28/23.  Pt Ok to return 06/28/23.   3. Encounter for counseling regarding contraception --Discussed pt contraceptive plans and reviewed contraceptive methods based on pt preferences and effectiveness.  Pt prefers Paragard IUD.  - paragard intrauterine copper  IUD 1 each  4. Encounter for IUD insertion   5. Postpartum depression --Pt scored 20 on Edinburgh scale, and does report thoughts of harming herself. On further discussion today, she does not have a plan, is able to know when her lack of sleep affects mood and anxious thoughts.  She reports this is better than she was with her first pregnancy so she knows what it is this time.  Emergency resources given, pt to go to ED/MAU immediately if active thoughts of self harm or harming others.  --Reviewed strategies to get more sleep, to do more self care, and reassurance that schedule/sleep is likely to improve in upcoming  weeks. --Refer to [facility], pt saw Asher Muir in 2020 with first pregnancy --Start Zoloft at 50 mg daily, discussed lower start dose but desire to get to therapeutic effect as early as possible   - sertraline (ZOLOFT) 50 MG tablet; Take 1 tablet (50 mg total) by mouth daily.  Dispense: 30 tablet; Refill: 2   Plan:   Essential components of care per ACOG recommendations:  1.  Mood and well being: Patient with positive depression screening today. Reviewed local resources for support.  - Patient tobacco use? No.   - hx of drug use? No.    2. Infant care and feeding:  -Patient currently breastmilk feeding? Yes. Reviewed importance of draining breast regularly to support lactation.  -Social determinants of health (SDOH) reviewed in EPIC. No concerns   3. Sexuality, contraception and birth spacing - Patient does not want a pregnancy in the next year.  Desired family size is 3 children.  - Reviewed reproductive life planning. Reviewed contraceptive methods based on pt preferences and effectiveness.  Patient desired IUD or IUS today.    4. Sleep and fatigue -Encouraged family/partner/community support of 4 hrs of uninterrupted sleep to help with mood and fatigue  5. Physical Recovery  - Discussed patients delivery and complications. She describes her labor as good. - Patient had a Vaginal, no problems at delivery. Patient had a 1st degree laceration. Perineal healing reviewed. Patient expressed understanding - Patient has urinary incontinence? No. - Patient is safe to resume physical and sexual activity  6.  Health Maintenance - HM due items addressed Yes - Last pap smear  Diagnosis  Date Value Ref Range Status  10/19/2022      - Negative for intraepithelial lesion or malignancy (NILM)   Pap smear not done at today's visit.  -Breast Cancer screening indicated? No.   7. Chronic Disease/Pregnancy Condition follow up: None  - PCP follow up  Sharen Counter, CNM Center for [employer], [facility]

## 2023-06-20 NOTE — BH Specialist Note (Signed)
Integrated Behavioral Health via Telemedicine Visit  07/03/2023 [name] [id number]  Number of Integrated Behavioral Health Clinician visits: 1- Initial Visit  Session Start time: 0847   Session End time: 0931  Total time in minutes: 44   Referring Provider: Sharen Counter, CNM Patient/Family location: [facility] Provider location: [facility]  All persons participating in visit: [name] and Dana Flores Dana Flores   Types of Service: Individual psychotherapy and Video visit  I connected with Dana Flores and/or Dana Flores's  n/a  via  Telephone or Engineer, civil (consulting)  (Video is Surveyor, mining) and verified that I am speaking with the correct person using two identifiers. Discussed confidentiality: Yes   I discussed the limitations of telemedicine and the availability of in person appointments.  Discussed there is a possibility of technology failure and discussed alternative modes of communication if that failure occurs.  I discussed that engaging in this telemedicine visit, they consent to the provision of behavioral healthcare and the services will be billed under their insurance.  Patient and/or legal guardian expressed understanding and consented to Telemedicine visit: Yes   Presenting Concerns: Patient and/or family reports the following symptoms/concerns: Anhedonia, fatigue, sleep deprivation, difficulty concentrating, poor appetite, low self-esteem, anxiety, difficulty relaxing, irritability; SI thoughts of "crashing car", but no intent; pt says she is here for her children/they are her reasons for living. Pt took Zoloft after first pregnancy, to treat depression postpartum, and improved her mood considerably at that time; will pick up Zoloft today and begin taking again. Pt open to referral to psychiatry for ongoing [facility] medication management; mom comes over daily for  practical support; siblings help when able.  Duration of problem: Postpartum; Severity of problem: severe  Patient and/or Family's Strengths/Protective Factors: Social connections and Sense of purpose  Goals Addressed: Patient will:  Reduce symptoms of: anxiety and depression   Increase knowledge and/or ability of: healthy habits   Demonstrate ability to: Increase healthy adjustment to current life circumstances and Increase motivation to adhere to plan of care  Progress towards Goals: Ongoing  Interventions: Interventions utilized:  Motivational Interviewing, Psychoeducation and/or Health Education, Link to [employer], and Safety Standardized Assessments completed: C-SSRS Short, GAD-7, and PHQ 9  Patient and/or Family Response: Patient agrees with treatment plan.   Assessment: Patient currently experiencing Major depressive disorder, recurrent, severe without psychotic features.   Patient may benefit from psychoeducation and brief therapeutic interventions regarding coping with symptoms of depression, anxiety .  Plan: Follow up with behavioral health clinician on : One month; Call Trimaine Maser at [phone], as needed. Behavioral recommendations:  -Begin taking Zoloft today after picking up from pharmacy -Begin prioritizing healthy self-care (regular meals, adequate rest; allowing practical help from supportive friends and family) until at least postpartum medical appointment -Consider new mom support group as needed at either [email or web address] or [email or web address]  -Accept referral to psychiatry; Use [facility] and/or establish with psychiatry via walk-in outpatient hours, as discussed (information on After Visit Summary) Referral(s): Integrated Art gallery manager (In Clinic), Community Mental Health Services (LME/Outside Clinic), and Community Resources:  new mom support  I discussed the assessment and treatment plan with the patient and/or  parent/guardian. They were provided an opportunity to ask questions and all were answered. They agreed with the plan and demonstrated an understanding of the instructions.   They were advised to call back or seek an in-person evaluation if the symptoms worsen or if  the condition fails to improve as anticipated.  Rae Lips, LCSW     07/03/2023    8:53 AM 02/14/2023    8:27 AM 09/21/2022    1:57 PM 03/18/2020    8:57 AM 10/01/2018    4:38 PM  Depression screen PHQ 2/9  Decreased Interest 3 0 1 2 2   Down, Depressed, Hopeless 1 0 0 1 2  PHQ - 2 Score 4 0 1 3 4   Altered sleeping 3 3 0 0 2  Tired, decreased energy 3 3 0 3 3  Change in appetite 2 3 1 3 3   Feeling bad or failure about yourself  2 0 0 2 2  Trouble concentrating 3 3 1  0 2  Moving slowly or fidgety/restless 1 0 2 0 1  Suicidal thoughts 1 0 0 0 1  PHQ-9 Score 19 12 5 11 18       07/03/2023    8:55 AM 02/14/2023    8:29 AM 09/21/2022    2:02 PM 10/01/2018    4:39 PM  GAD 7 : Generalized Anxiety Score  Nervous, Anxious, on Edge 3 3 2 2   Control/stop worrying 1 1 0 3  Worry too much - different things 2 3 2 3   Trouble relaxing 3 1 1 3   Restless 3 1 2 2   Easily annoyed or irritable 3 3 1 3   Afraid - awful might happen 2 1 1 2   Total GAD 7 Score 17 13 9  18

## 2023-07-03 ENCOUNTER — Ambulatory Visit (INDEPENDENT_AMBULATORY_CARE_PROVIDER_SITE_OTHER): Payer: MEDICAID | Admitting: Clinical

## 2023-07-03 DIAGNOSIS — F332 Major depressive disorder, recurrent severe without psychotic features: Secondary | ICD-10-CM | POA: Diagnosis not present

## 2023-07-03 NOTE — Patient Instructions (Signed)
[facility] at [facility] [address] [place], [place] [address] [phone] (main office) [phone] (Jamie's office)  New Parent Support Groups [email or web address] [email or web address]   [facility]  [address], [place], [place] [address] [phone] or [phone] WALK-IN URGENT CARE 24/7 FOR ANYONE [address], [place], [place]  [phone] Fax: [phone] [email or web address] *Interpreters available *Accepts all insurance and uninsured for Urgent Care needs *Accepts Medicaid and uninsured for outpatient treatment (below)    ONLY FOR [place] Residents  Below:   Outpatient New Patient Assessment/Therapy Walk-ins:        Monday -Thursday 8am until slots are full.        Every Friday 1pm-4pm  (first come, first served)                   New Patient Psychiatry/Medication Management        Monday-Friday 8am-11am (first come, first served)              For all walk-ins we ask that you arrive by 7:15am, because patients will be seen in the order of arrival.     

## 2023-07-12 NOTE — BH Specialist Note (Unsigned)
Integrated Behavioral Health via Telemedicine Visit  07/26/2023 KAJSA HIRONS [id number]  Number of Integrated Behavioral Health Clinician visits: 2- Second Visit  Session Start time: 0822   Session End time: 0826  Total time in minutes: 4   Referring Provider: Sharen Counter, CNM Patient/Family location: In car, getting children to school [facility] Provider location: [facility] [employer] at [facility]  All persons participating in visit: [name] and [facility] Josealberto Montalto   Types of Service: Individual psychotherapy and Video visit  I connected with Gethsemane L Lafayette Dragon and/or Georgene L Renaud's  n/a  via  Telephone or Engineer, civil (consulting)  (Video is Surveyor, mining) and verified that I am speaking with the correct person using two identifiers. Discussed confidentiality: Yes   I discussed the limitations of telemedicine and the availability of in person appointments.  Discussed there is a possibility of technology failure and discussed alternative modes of communication if that failure occurs.  I discussed that engaging in this telemedicine visit, they consent to the provision of behavioral healthcare and the services will be billed under their insurance.  Patient and/or legal guardian expressed understanding and consented to Telemedicine visit: Yes   Presenting Concerns: Patient and/or family reports the following symptoms/concerns: Pt states she is feeling "a lot more patient" with mood improvement, attributes to  taking Zoloft as prescribed, as well as receiving more help at night with children from baby's father. No negative side effects noticed.  Duration of problem: Ongoing; Severity of problem:  moderately severe  Patient and/or Family's Strengths/Protective Factors: Social connections, Concrete supports in place (healthy food, safe environments, etc.), Sense of purpose, and Physical Health (exercise, healthy diet,  medication compliance, etc.)  Goals Addressed: Patient will:  Reduce symptoms of: anxiety and depression    Demonstrate ability to: Increase motivation to adhere to plan of care  Progress towards Goals: Ongoing  Interventions: Interventions utilized:  Medication Monitoring Standardized Assessments completed: Not Needed  Patient and/or Family Response: Patient agrees with treatment plan.   Assessment: Patient currently experiencing Major depressive disorder, recurrent, severe without psychotic features.   Patient may benefit from continued therapeutic intervention  .  Plan: Follow up with behavioral health clinician on : Three weeks Behavioral recommendations:  -Continue taking Zoloft as prescribed -Continue daily healthy self-care; accepting referral to psychiatry; consider new mom support group as needed Referral(s): Integrated [employer] (In Clinic)  I discussed the assessment and treatment plan with the patient and/or parent/guardian. They were provided an opportunity to ask questions and all were answered. They agreed with the plan and demonstrated an understanding of the instructions.   They were advised to call back or seek an in-person evaluation if the symptoms worsen or if the condition fails to improve as anticipated.  Rae Lips, LCSW     07/03/2023    8:53 AM 02/14/2023    8:27 AM 09/21/2022    1:57 PM 03/18/2020    8:57 AM 10/01/2018    4:38 PM  Depression screen PHQ 2/9  Decreased Interest 3 0 1 2 2   Down, Depressed, Hopeless 1 0 0 1 2  PHQ - 2 Score 4 0 1 3 4   Altered sleeping 3 3 0 0 2  Tired, decreased energy 3 3 0 3 3  Change in appetite 2 3 1 3 3   Feeling bad or failure about yourself  2 0 0 2 2  Trouble concentrating 3 3 1  0 2  Moving slowly  or fidgety/restless 1 0 2 0 1  Suicidal thoughts 1 0 0 0 1  PHQ-9 Score 19 12 5 11 18       07/03/2023    8:55 AM 02/14/2023    8:29 AM 09/21/2022    2:02 PM 10/01/2018    4:39 PM  GAD 7 :  Generalized Anxiety Score  Nervous, Anxious, on Edge 3 3 2 2   Control/stop worrying 1 1 0 3  Worry too much - different things 2 3 2 3   Trouble relaxing 3 1 1 3   Restless 3 1 2 2   Easily annoyed or irritable 3 3 1 3   Afraid - awful might happen 2 1 1 2   Total GAD 7 Score 17 13 9  18

## 2023-07-19 ENCOUNTER — Other Ambulatory Visit: Payer: Self-pay

## 2023-07-20 ENCOUNTER — Ambulatory Visit: Payer: MEDICAID

## 2023-07-20 VITALS — BP 108/74 | HR 57 | Ht 59.0 in | Wt 180.0 lb

## 2023-07-20 DIAGNOSIS — T8332XA Displacement of intrauterine contraceptive device, initial encounter: Secondary | ICD-10-CM | POA: Diagnosis not present

## 2023-07-20 DIAGNOSIS — Z30431 Encounter for routine checking of intrauterine contraceptive device: Secondary | ICD-10-CM | POA: Diagnosis not present

## 2023-07-20 MED ORDER — LEVONORGESTREL 1.5 MG PO TABS
1.5000 mg | ORAL_TABLET | Freq: Once | ORAL | Status: DC
Start: 2023-07-20 — End: 2023-07-25

## 2023-07-20 NOTE — Progress Notes (Signed)
    GYNECOLOGY OFFICE ENCOUNTER NOTE  History:  Dana Flores is a 22 y.o. [phone] here today for today for IUD string check; Paragard  IUD was placed  Nov 5th. No complaints about the IUD, no concerning side effects. She reports partner is able to feel "something," but does not cause him pain or discomfort.   The following portions of the patient's history were reviewed and updated as appropriate: allergies, current medications, past family history, past medical history, past social history, past surgical history and problem list. Last pap smear on March 2024 was normal, negative HRHPV.  Review of Systems:  Pertinent items are noted in HPI.   Objective:  Physical Exam Blood pressure 108/74, pulse (!) 57, height 4\' 11"  (1.499 m), weight 180 lb (81.6 kg), last menstrual period 06/20/2023, currently breastfeeding. CONSTITUTIONAL: Well-developed, well-nourished female in no acute distress.  NEUROLOGIC: Alert and oriented to person, place, and time. Normal reflexes, muscle tone coordination.  PSYCHIATRIC: Normal mood and affect. Normal behavior. Normal judgment and thought content. CARDIOVASCULAR: Normal heart rate noted RESPIRATORY: Effort and breath sounds normal, no problems with respiration noted ABDOMEN: Soft, no distention noted.   PELVIC: Normal appearing external genitalia; normal appearing vaginal mucosa.  Cervix IUD strings visualized, about 5 cm in length outside cervix. Done in the presence of a chaperone.  BME reveals posterior aspect of device at external os.   Assessment & Plan:  IUD Malpositioned  -Informed of IUD malpresentation. -Discussed removal and reinsertion. -Informed that IUD not effective in this position.  -Patient tearful and states that she had UPSI last night. -Discussed sending Plan B and patient agreeable. -Reviewed premedicating for removal and reinsertion. Patient agreeable.  -Patient to return in one week for procedure.  Cherre Robins MSN,  CNM Advanced Practice Provider, Center for [employer]

## 2023-07-20 NOTE — Progress Notes (Signed)
Pt. Presents for sting check, pp visit was done on 11/5. No other questions or concerns.

## 2023-07-25 DIAGNOSIS — Z30430 Encounter for insertion of intrauterine contraceptive device: Secondary | ICD-10-CM

## 2023-07-25 MED ORDER — IBUPROFEN 800 MG PO TABS: 800.0000 mg | ORAL_TABLET | Freq: Three times a day (TID) | ORAL | 0 refills | Status: DC | PRN

## 2023-07-25 MED ORDER — DIAZEPAM 5 MG PO TABS: 5.0000 mg | ORAL_TABLET | Freq: Once | ORAL | 0 refills | Status: AC

## 2023-07-26 ENCOUNTER — Ambulatory Visit: Payer: MEDICAID | Admitting: Clinical

## 2023-07-26 ENCOUNTER — Ambulatory Visit: Payer: MEDICAID

## 2023-07-26 VITALS — BP 113/72 | HR 56 | Ht 59.0 in | Wt 181.0 lb

## 2023-07-26 DIAGNOSIS — F332 Major depressive disorder, recurrent severe without psychotic features: Secondary | ICD-10-CM

## 2023-07-26 DIAGNOSIS — Z30430 Encounter for insertion of intrauterine contraceptive device: Secondary | ICD-10-CM

## 2023-07-26 DIAGNOSIS — Z30433 Encounter for removal and reinsertion of intrauterine contraceptive device: Secondary | ICD-10-CM | POA: Diagnosis not present

## 2023-07-26 DIAGNOSIS — Z3202 Encounter for pregnancy test, result negative: Secondary | ICD-10-CM

## 2023-07-26 DIAGNOSIS — Z30432 Encounter for removal of intrauterine contraceptive device: Secondary | ICD-10-CM

## 2023-07-26 LAB — POCT URINE PREGNANCY: Preg Test, Ur: NEGATIVE

## 2023-07-26 MED ORDER — LEVONORGESTREL 20 MCG/DAY IU IUD
1.0000 | INTRAUTERINE_SYSTEM | Freq: Once | INTRAUTERINE | Status: AC
Start: 2023-07-26 — End: 2023-07-26
  Administered 2023-07-26: 1 via INTRAUTERINE

## 2023-07-26 NOTE — Patient Instructions (Signed)
iud

## 2023-07-26 NOTE — Progress Notes (Unsigned)
    GYNECOLOGY OFFICE PROCEDURE NOTE  Dana Flores is a 22 y.o. [phone] here for removal of malpositioned Paragard IUD and insertion of Mirena IUD. No GYN concerns.  Last pap smear was on March 2024 and was normal.  IUD Removal/Reinsertion Patient identified, informed consent performed, consent signed.  Discussed risks of irregular bleeding, cramping, infection, malpositioning or misplacement of the IUD outside the uterus which may require further procedure such as laparoscopy. Time out was performed.  Urine pregnancy test negative.  Patient was in the dorsal lithotomy position, normal external genitalia was noted.  A speculum was placed in the patient's vagina, normal discharge was noted, no lesions. The cervix was visualized, no lesions, no abnormal discharge. Cervix and vaginal walls cleaned x 3 with betadine solution.  The posterior aspect of the IUD device was noted at the external os and was grasped and pulled using ring forceps. The IUD was removed in its entirety. IUD shown to patient for verification.   Anterior aspect of cervix grasped with a single tooth tenaculum.  Uterus sounded to 8 cm with IUD device.  Mirena IUD placed per manufacturer's recommendations.  Strings trimmed to ~3 cm. Tenaculum was removed, good hemostasis noted.  Patient tolerated procedure well.   Patient was given post-procedure instructions.  She was advised to have backup contraception for one week.  Patient was instructed to check IUD strings after menses or every 2 months in the absence of menses. Patient also instructed to follow up in 4 weeks for IUD check and call and/or report any issues prior to next visit.  Cherre Robins MSN, CNM Advanced Practice Provider, [facility] for [employer]

## 2023-07-26 NOTE — Patient Instructions (Signed)
[facility] at [facility] [address] [place], [place] [address] [phone] (main office) [phone] (Jamie's office)  New Parent Support Groups [email or web address] [email or web address]   [facility]  [address], [place], [place] [address] [phone] or [phone] WALK-IN URGENT CARE 24/7 FOR ANYONE [address], [place], [place]  [phone] Fax: [phone] [email or web address] *Interpreters available *Accepts all insurance and uninsured for Urgent Care needs *Accepts Medicaid and uninsured for outpatient treatment (below)    ONLY FOR [place] Residents  Below:   Outpatient New Patient Assessment/Therapy Walk-ins:        Monday -Thursday 8am until slots are full.        Every Friday 1pm-4pm  (first come, first served)                   New Patient Psychiatry/Medication Management        Monday-Friday 8am-11am (first come, first served)              For all walk-ins we ask that you arrive by 7:15am, because patients will be seen in the order of arrival.     

## 2023-08-04 NOTE — BH Specialist Note (Deleted)
Integrated Behavioral Health via Telemedicine Visit  08/04/2023 Dana Flores [id number]  Number of Integrated Behavioral Health Clinician visits: 2- Second Visit  Session Start time: 0822   Session End time: 0826  Total time in minutes: 4   Referring Provider: *** Patient/Family location: Home*** [facility] Provider location: [facility]  All persons participating in visit: [name] and [facility] Dana Flores ***  Types of Service: {CHL AMB TYPE OF SERVICE:[id number]}  I connected with Dana Flores and/or Dana Flores's {family members:20773} via  Telephone or Engineer, civil (consulting)  (Video is Surveyor, mining) and verified that I am speaking with the correct person using two identifiers. Discussed confidentiality: Yes   I discussed the limitations of telemedicine and the availability of in person appointments.  Discussed there is a possibility of technology failure and discussed alternative modes of communication if that failure occurs.  I discussed that engaging in this telemedicine visit, they consent to the provision of behavioral healthcare and the services will be billed under their insurance.  Patient and/or legal guardian expressed understanding and consented to Telemedicine visit: Yes   Presenting Concerns: Patient and/or family reports the following symptoms/concerns: *** Duration of problem: ***; Severity of problem: {Mild/Moderate/Severe:20260}  Patient and/or Family's Strengths/Protective Factors: {CHL AMB BH PROTECTIVE FACTORS:[phone]}  Goals Addressed: Patient will:  Reduce symptoms of: {IBH Symptoms:21014056}   Increase knowledge and/or ability of: {IBH Patient Tools:21014057}   Demonstrate ability to: {IBH Goals:21014053}  Progress towards Goals: {CHL AMB BH PROGRESS TOWARDS [phone]}  Interventions: Interventions utilized:  {IBH  Interventions:21014054} Standardized Assessments completed: {IBH Screening Tools:21014051}  Patient and/or Family Response: Patient agrees with treatment plan.   Assessment: Patient currently experiencing ***.   Patient may benefit from continued therapeutic intervention *** .  Plan: Follow up with behavioral health clinician on : *** Behavioral recommendations:  -*** -*** Referral(s): {IBH Referrals:21014055}  I discussed the assessment and treatment plan with the patient and/or parent/guardian. They were provided an opportunity to ask questions and all were answered. They agreed with the plan and demonstrated an understanding of the instructions.   They were advised to call back or seek an in-person evaluation if the symptoms worsen or if the condition fails to improve as anticipated.  Dana Flores, Dana Flores     07/03/2023    8:53 AM 02/14/2023    8:27 AM 09/21/2022    1:57 PM 03/18/2020    8:57 AM 10/01/2018    4:38 PM  Depression screen PHQ 2/9  Decreased Interest 3 0 1 2 2   Down, Depressed, Hopeless 1 0 0 1 2  PHQ - 2 Score 4 0 1 3 4   Altered sleeping 3 3 0 0 2  Tired, decreased energy 3 3 0 3 3  Change in appetite 2 3 1 3 3   Feeling bad or failure about yourself  2 0 0 2 2  Trouble concentrating 3 3 1  0 2  Moving slowly or fidgety/restless 1 0 2 0 1  Suicidal thoughts 1 0 0 0 1  PHQ-9 Score 19 12 5 11 18       07/03/2023    8:55 AM 02/14/2023    8:29 AM 09/21/2022    2:02 PM 10/01/2018    4:39 PM  GAD 7 : Generalized Anxiety Score  Nervous, Anxious, on Edge 3 3 2 2   Control/stop worrying 1 1 0 3  Worry too much - different things 2 3 2  3  Trouble relaxing 3 1 1 3   Restless 3 1 2 2   Easily annoyed or irritable 3 3 1 3   Afraid - awful might happen 2 1 1 2   Total GAD 7 Score 17 13 9  18

## 2023-08-07 ENCOUNTER — Other Ambulatory Visit: Payer: Self-pay

## 2023-08-07 ENCOUNTER — Emergency Department (HOSPITAL_BASED_OUTPATIENT_CLINIC_OR_DEPARTMENT_OTHER)
Admission: EM | Admit: 2023-08-07 | Discharge: 2023-08-07 | Disposition: A | Discharge status: Home / Self Care | Payer: MEDICAID | Attending: Emergency Medicine | Admitting: Emergency Medicine

## 2023-08-07 ENCOUNTER — Encounter (HOSPITAL_BASED_OUTPATIENT_CLINIC_OR_DEPARTMENT_OTHER): Payer: Self-pay | Admitting: Emergency Medicine

## 2023-08-07 ENCOUNTER — Telehealth: Payer: MEDICAID | Admitting: Family Medicine

## 2023-08-07 ENCOUNTER — Emergency Department (HOSPITAL_BASED_OUTPATIENT_CLINIC_OR_DEPARTMENT_OTHER): Payer: MEDICAID

## 2023-08-07 DIAGNOSIS — R1030 Lower abdominal pain, unspecified: Secondary | ICD-10-CM | POA: Insufficient documentation

## 2023-08-07 DIAGNOSIS — R112 Nausea with vomiting, unspecified: Secondary | ICD-10-CM

## 2023-08-07 DIAGNOSIS — R109 Unspecified abdominal pain: Secondary | ICD-10-CM

## 2023-08-07 DIAGNOSIS — R197 Diarrhea, unspecified: Secondary | ICD-10-CM

## 2023-08-07 LAB — CBC WITH DIFFERENTIAL/PLATELET
Abs Immature Granulocytes: 0.03 10*3/uL (ref 0.00–0.07)
Basophils Absolute: 0 10*3/uL (ref 0.0–0.1)
Basophils Relative: 0 %
Eosinophils Absolute: 0.1 10*3/uL (ref 0.0–0.5)
Eosinophils Relative: 1 %
HCT: 49 % — ABNORMAL HIGH (ref 36.0–46.0)
Hemoglobin: 16.2 g/dL — ABNORMAL HIGH (ref 12.0–15.0)
Immature Granulocytes: 0 %
Lymphocytes Relative: 19 %
Lymphs Abs: 1.7 10*3/uL (ref 0.7–4.0)
MCH: 28.3 pg (ref 26.0–34.0)
MCHC: 33.1 g/dL (ref 30.0–36.0)
MCV: 85.7 fL (ref 80.0–100.0)
Monocytes Absolute: 0.8 10*3/uL (ref 0.1–1.0)
Monocytes Relative: 8 %
Neutro Abs: 6.4 10*3/uL (ref 1.7–7.7)
Neutrophils Relative %: 72 %
Platelets: 220 10*3/uL (ref 150–400)
RBC: 5.72 MIL/uL — ABNORMAL HIGH (ref 3.87–5.11)
RDW: 15.7 % — ABNORMAL HIGH (ref 11.5–15.5)
WBC: 9 10*3/uL (ref 4.0–10.5)
nRBC: 0 % (ref 0.0–0.2)

## 2023-08-07 LAB — URINALYSIS, ROUTINE W REFLEX MICROSCOPIC
Bacteria, UA: NONE SEEN
Bilirubin Urine: NEGATIVE
Glucose, UA: NEGATIVE mg/dL
Ketones, ur: 40 mg/dL — AB
Leukocytes,Ua: NEGATIVE
Nitrite: NEGATIVE
Protein, ur: NEGATIVE mg/dL
Specific Gravity, Urine: 1.011 (ref 1.005–1.030)
pH: 6.5 (ref 5.0–8.0)

## 2023-08-07 LAB — COMPREHENSIVE METABOLIC PANEL
ALT: 10 U/L (ref 0–44)
AST: 12 U/L — ABNORMAL LOW (ref 15–41)
Albumin: 4 g/dL (ref 3.5–5.0)
Alkaline Phosphatase: 56 U/L (ref 38–126)
Anion gap: 8 (ref 5–15)
BUN: 11 mg/dL (ref 6–20)
CO2: 25 mmol/L (ref 22–32)
Calcium: 9 mg/dL (ref 8.9–10.3)
Chloride: 105 mmol/L (ref 98–111)
Creatinine, Ser: 0.76 mg/dL (ref 0.44–1.00)
GFR, Estimated: >60 mL/min (ref >60)
Glucose, Bld: 80 mg/dL (ref 70–99)
Potassium: 3.6 mmol/L (ref 3.5–5.1)
Sodium: 138 mmol/L (ref 135–145)
Total Bilirubin: 0.5 mg/dL (ref <1.2)
Total Protein: 7 g/dL (ref 6.5–8.1)

## 2023-08-07 LAB — PREGNANCY, URINE: Preg Test, Ur: NEGATIVE

## 2023-08-07 LAB — LIPASE, BLOOD: Lipase: <10 U/L — ABNORMAL LOW (ref 11–51)

## 2023-08-07 MED ORDER — KETOROLAC TROMETHAMINE 30 MG/ML IJ SOLN
15.0000 mg | Freq: Once | INTRAMUSCULAR | Status: AC
  Administered 2023-08-07: 15 mg via INTRAVENOUS
  Filled 2023-08-07: qty 1

## 2023-08-07 MED ORDER — ONDANSETRON HCL 4 MG PO TABS: 4.0000 mg | ORAL_TABLET | Freq: Three times a day (TID) | ORAL | 0 refills | Status: DC | PRN

## 2023-08-07 MED ORDER — ONDANSETRON HCL 4 MG PO TABS: 4.0000 mg | ORAL_TABLET | Freq: Four times a day (QID) | ORAL | 0 refills | Status: DC

## 2023-08-07 MED ORDER — ACETAMINOPHEN 500 MG PO TABS
1000.0000 mg | ORAL_TABLET | Freq: Once | ORAL | Status: AC
  Administered 2023-08-07: 1000 mg via ORAL
  Filled 2023-08-07: qty 2

## 2023-08-07 MED ORDER — IBUPROFEN 600 MG PO TABS
600.0000 mg | ORAL_TABLET | Freq: Four times a day (QID) | ORAL | 0 refills | Status: AC | PRN
Start: 2023-08-07 — End: ?

## 2023-08-07 MED ORDER — ONDANSETRON 4 MG PO TBDP
4.0000 mg | ORAL_TABLET | Freq: Once | ORAL | Status: AC
  Administered 2023-08-07: 4 mg via ORAL
  Filled 2023-08-07: qty 1

## 2023-08-07 MED ORDER — IBUPROFEN 800 MG PO TABS
800.0000 mg | ORAL_TABLET | Freq: Once | ORAL | Status: AC
  Administered 2023-08-07: 800 mg via ORAL
  Filled 2023-08-07: qty 1

## 2023-08-07 MED ORDER — ONDANSETRON HCL 4 MG/2ML IJ SOLN
4.0000 mg | Freq: Once | INTRAMUSCULAR | Status: AC
  Administered 2023-08-07: 4 mg via INTRAVENOUS
  Filled 2023-08-07: qty 2

## 2023-08-07 NOTE — ED Provider Notes (Signed)
6:55 PM Patient taken at shift handoff from [facility].  Here with nausea and vomiting, lower abdominal pain without tenderness to palpation.  Recent IUD placement.  Currently awaiting ultrasound to rule out migration of the IUD.  Suspect patient will likely be able to be discharged if there are no findings. Physical Exam  BP (!) 146/88   Pulse 62   Temp 98.2 F (36.8 C) (Oral)   Resp 20   LMP 06/20/2023 (Approximate)   SpO2 98%   Breastfeeding Yes   Physical Exam Vitals and nursing note reviewed.  Constitutional:      General: She is not in acute distress.    Appearance: She is well-developed. She is not diaphoretic.  HENT:     Head: Normocephalic and atraumatic.     Right Ear: External ear normal.     Left Ear: External ear normal.     Nose: Nose normal.     Mouth/Throat:     Mouth: Mucous membranes are moist.  Eyes:     General: No scleral icterus.    Conjunctiva/sclera: Conjunctivae normal.  Cardiovascular:     Rate and Rhythm: Normal rate and regular rhythm.     Heart sounds: Normal heart sounds. No murmur heard.    No friction rub. No gallop.  Pulmonary:     Effort: Pulmonary effort is normal. No respiratory distress.     Breath sounds: Normal breath sounds.  Abdominal:     General: Bowel sounds are normal. There is no distension.     Palpations: Abdomen is soft. There is no mass.     Tenderness: There is abdominal tenderness. There is no guarding.     Hernia: No hernia is present.       Comments: Palpable rectus diastases without evidence of hernia  Musculoskeletal:     Cervical back: Normal range of motion.  Skin:    General: Skin is warm and dry.  Neurological:     Mental Status: She is alert and oriented to person, place, and time.  Psychiatric:        Behavior: Behavior normal.     Procedures  Procedures  ED Course / MDM    Medical Decision Making Amount and/or Complexity of Data Reviewed Labs: ordered. Radiology: ordered.  Risk OTC  drugs. Prescription drug management.   Patient reevaluated.  She is tender right along the middle of her abdomen she has a palpable rectus diastases. I evaluated patient's labs, no acute findings.  Ultrasound of the pelvis negative for acute finding an IUD in place.  Patient be discharged with anti-inflammatories and nausea.  She does not wish to further her workup at this time.  She appears appropriate for discharge.  Return precautions discussed.      Arthor Captain, PA-C 08/07/23 2351    Tegeler, Canary Brim, MD 08/08/23 0000

## 2023-08-07 NOTE — Progress Notes (Signed)
 For the safety of you and your child, I recommend a face to face office visit with a health care provider.  Many mothers need to take medicines during their pregnancy and while nursing.  Almost all medicines pass into the breast milk in small quantities.  Most are generally considered safe for a mother to take but some medicines must be avoided.  After reviewing your E-Visit request, I recommend that you consult your OB/GYN or pediatrician for medical advice in relation to your condition and prescription medications while pregnant or breastfeeding.  NOTE:  There will be NO CHARGE for this eVisit  If you are having a true medical emergency please call 911.

## 2023-08-07 NOTE — ED Notes (Signed)
ED Provider at bedside, speaking to patient about [place] results and d/c plans

## 2023-08-07 NOTE — ED Triage Notes (Signed)
Nausea vomiting and discomfort. Symptoms started after IUD placement on 07/26/2023 and getting worse  Vomiting started at 3 AM

## 2023-08-07 NOTE — ED Provider Notes (Cosign Needed Addendum)
[place] EMERGENCY DEPARTMENT AT [facility] Provider Note   CSN: [id number] Arrival date & time: 08/07/23  1454     History  Chief Complaint  Patient presents with   Emesis   HPI Dana Flores is a 22 y.o. female with history of anemia presenting for nausea vomiting and abdominal discomfort. Started at 3 AM this morning.  Vomiting is nonbloody and nonbilious.  Denies urinary symptoms. Abdominal discomfort is primarily in the lower abdomen.  It is intermittent and started shortly after her new IUD was placed on 12/11.  Reports intermittent vaginal spotting but states is actually improved since placement of her IUD otherwise denies abnormal vaginal bleeding or discharge.  States she took a pregnancy test before arriving here and it was negative.  Reports that she does chronically have issues with constipation but states she did have a bowel movement yesterday.  This   Emesis      Home Medications Prior to Admission medications   Medication Sig Start Date End Date Taking? Authorizing Provider  ibuprofen (ADVIL) 600 MG tablet Take 1 tablet (600 mg total) by mouth every 6 (six) hours as needed. 08/07/23  Yes Riki Sheer K, PA-C  ondansetron (ZOFRAN) 4 MG tablet Take 1 tablet (4 mg total) by mouth every 6 (six) hours. 08/07/23  Yes Gareth Eagle, PA-C  Prenatal Vit-Fe Phos-FA-Omega (VITAFOL GUMMIES) 3.33-0.333-34.8 MG CHEW Chew 1 tablet by mouth daily. Patient not taking: Reported on 06/20/2023 09/19/22   Constant, Peggy, MD  sertraline (ZOLOFT) 50 MG tablet Take 1 tablet (50 mg total) by mouth daily. 06/20/23   Leftwich-Kirby, Wilmer Floor, CNM      Allergies    Patient has no known allergies.    Review of Systems   Review of Systems  Gastrointestinal:  Positive for vomiting.    Physical Exam Updated Vital Signs BP (!) 146/88   Pulse 62   Temp 98.2 F (36.8 C) (Oral)   Resp 20   LMP 06/20/2023 (Approximate)   SpO2 98%   Breastfeeding Yes  Physical  Exam Vitals and nursing note reviewed.  HENT:     Head: Normocephalic and atraumatic.     Mouth/Throat:     Mouth: Mucous membranes are moist.  Eyes:     General:        Right eye: No discharge.        Left eye: No discharge.     Conjunctiva/sclera: Conjunctivae normal.  Cardiovascular:     Rate and Rhythm: Normal rate and regular rhythm.     Pulses: Normal pulses.     Heart sounds: Normal heart sounds.  Pulmonary:     Effort: Pulmonary effort is normal.     Breath sounds: Normal breath sounds.  Abdominal:     General: Abdomen is flat. There is no distension.     Palpations: Abdomen is soft.     Tenderness: There is no abdominal tenderness.  Skin:    General: Skin is warm and dry.  Neurological:     General: No focal deficit present.  Psychiatric:        Mood and Affect: Mood normal.     ED Results / Procedures / Treatments   Labs (all labs ordered are listed, but only abnormal results are displayed) Labs Reviewed  CBC WITH DIFFERENTIAL/PLATELET - Abnormal; Notable for the following components:      Result Value   RBC 5.72 (*)    Hemoglobin 16.2 (*)    HCT 49.0 (*)  RDW 15.7 (*)    All other components within normal limits  COMPREHENSIVE METABOLIC PANEL - Abnormal; Notable for the following components:   AST 12 (*)    All other components within normal limits  LIPASE, BLOOD - Abnormal; Notable for the following components:   Lipase <10 (*)    All other components within normal limits  URINALYSIS, ROUTINE W REFLEX MICROSCOPIC - Abnormal; Notable for the following components:   Hgb urine dipstick SMALL (*)    Ketones, ur 40 (*)    All other components within normal limits  PREGNANCY, URINE    EKG None  Radiology No results found.  Procedures Procedures    Medications Ordered in ED Medications  ondansetron (ZOFRAN) injection 4 mg (4 mg Intravenous Given 08/07/23 1529)  ketorolac (TORADOL) 30 MG/ML injection 15 mg (15 mg Intravenous Given 08/07/23  1530)  acetaminophen (TYLENOL) tablet 1,000 mg (1,000 mg Oral Given 08/07/23 1730)    ED Course/ Medical Decision Making/ A&P                                 Medical Decision Making Amount and/or Complexity of Data Reviewed Labs: ordered. Radiology: ordered.  Risk OTC drugs. Prescription drug management.   22 year old well-appearing female presenting for nausea and vomiting. Exam was unremarkable with a nontender abdomen.  DDx includes appendicitis, ectopic pregnancy, ovarian torsion, UTI, diverticulitis, kidney stone, other. Labs did reveal small amount of blood in the urine but otherwise unremarkable.  Urine Preg is negative. Considered intra-abdominal infection but unlikely given nontender abdomen.  Given how she characterized her pain, feel it is reasonable to evaluate for ovarian torsion.  Ultrasound is pending.  Patient also concerned about the position of her IUD as well.  Plan at this time will be to follow-up on results of the ultrasound and reassess her symptoms.  Likely discharge if remaining workup is reassuring.  On last assessment patient stated that her abdominal pain had improved significantly.  Signed out patient to PA Arthor Captain.        Final Clinical Impression(s) / ED Diagnoses Final diagnoses:  Abdominal discomfort    Rx / DC Orders ED Discharge Orders          Ordered    ondansetron (ZOFRAN) 4 MG tablet  Every 6 hours        08/07/23 1851    ibuprofen (ADVIL) 600 MG tablet  Every 6 hours PRN        08/07/23 1851              Gareth Eagle, PA-C 08/07/23 1836    Gareth Eagle, PA-C 08/07/23 1851    Gwyneth Sprout, MD 08/10/23 [phone]

## 2023-08-07 NOTE — ED Notes (Signed)
Water given for fluid challenge  ?

## 2023-08-07 NOTE — Discharge Instructions (Addendum)
Abdominal (belly) pain can be caused by many things. Your caregiver performed an examination and possibly ordered blood/urine tests and imaging (CT scan, x-rays, ultrasound). Many cases can be observed and treated at home after initial evaluation in the emergency department. Even though you are being discharged home, abdominal pain can be unpredictable. Therefore, you need a repeated exam if your pain does not resolve, returns, or worsens. Most patients with abdominal pain don't have to be admitted to the hospital or have surgery, but serious problems like appendicitis and gallbladder attacks can start out as nonspecific pain. Many abdominal conditions cannot be diagnosed in one visit, so follow-up evaluations are very important. °SEEK IMMEDIATE MEDICAL ATTENTION IF: °The pain does not go away or becomes severe.  °A temperature above 101 develops.  °Repeated vomiting occurs (multiple episodes).  °The pain becomes localized to portions of the abdomen. The right side could possibly be appendicitis. In an adult, the left lower portion of the abdomen could be colitis or diverticulitis.  °Blood is being passed in stools or vomit (bright red or black tarry stools).  °Return also if you develop chest pain, difficulty breathing, dizziness or fainting, or become confused, poorly responsive, or inconsolable (young children). ° °

## 2023-08-24 NOTE — BH Specialist Note (Signed)
Integrated Behavioral Health via Telemedicine Visit  09/07/2023 Dana Flores [id number]  Number of Integrated Behavioral Health Clinician visits: 3- Third Visit  Session Start time: 1051   Session End time: 1126  Total time in minutes: 35   Referring Provider: Sharen Counter, CNM Patient/Family location: Home [facility] Provider location: [facility]  All persons participating in visit: [name] and [facility] Tymber Stallings   Types of Service: Individual psychotherapy and Video visit  I connected with Dana Flores  n/a  via  Telephone or Engineer, civil (consulting)  (Video is Surveyor, mining) and verified that I am speaking with the correct person using two identifiers. Discussed confidentiality: Yes   I discussed the limitations of telemedicine and the availability of in person appointments.  Discussed there is a possibility of technology failure and discussed alternative modes of communication if that failure occurs.  I discussed that engaging in this telemedicine visit, they consent to the provision of behavioral healthcare and the services will be billed under their insurance.  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  Duration of problem: Ongoing; Severity of problem:  moderately severe  Patient and/or Family's Strengths/Protective Factors: Social connections, Concrete supports in place (healthy food, safe environments, etc.), Sense of purpose, and Physical Health (exercise,  healthy diet, medication compliance, etc.)  Goals Addressed: Patient will:  Reduce symptoms of: anxiety and depression    Demonstrate ability to: Increase healthy adjustment to current life circumstances and Increase motivation to adhere to plan of care  Progress towards Goals: Ongoing  Interventions: Interventions utilized:  Motivational Interviewing, Medication Monitoring, and Supportive Reflection Standardized Assessments completed: GAD-7 and PHQ 9  Patient and/or Family Response: Patient agrees with treatment plan.   Assessment: Patient currently experiencing Major depressive disorder, recurrent, moderate.   Patient may benefit from continued therapeutic intervention.  .  Plan: Follow up with behavioral health clinician on : Call Vadhir Mcnay at [phone], as needed. Behavioral recommendations:  -Continue daily healthy self-care, including getting rest/sleep as able throughout the day, including short work breaks -Continue staying on childcare waitlist -Continue to consider referral to psychiatry and new mom support groups, as needed Referral(s): Integrated [employer] (In Clinic)  I discussed the assessment and treatment plan with the patient and/or parent/guardian. They were provided an opportunity to ask questions and all were answered. They agreed with the plan and demonstrated an understanding of the instructions.   They were advised to call back or seek an in-person evaluation if the symptoms worsen or if the condition fails to improve as anticipated.  Dana Lips, LCSW     09/07/2023   11:08 AM 07/03/2023    8:53 AM 02/14/2023    8:27 AM 09/21/2022    1:57 PM 03/18/2020    8:57 AM  Depression screen PHQ 2/9  Decreased Interest 1 3 0 1 2  Down, Depressed, Hopeless 1 1 0 0 1  PHQ - 2 Score 2 4 0 1 3  Altered sleeping 2 3 3  0 0  Tired, decreased energy 3 3 3  0 3  Change in appetite  0 2 3 1 3   Feeling bad or failure about yourself  0 2 0 0 2   Trouble concentrating 3 3 3 1  0  Moving slowly or fidgety/restless 0 1 0 2 0  Suicidal thoughts 0 1 0 0 0  PHQ-9 Score 10 19 12 5 11       09/07/2023   11:10 AM 07/03/2023    8:55 AM 02/14/2023    8:29 AM 09/21/2022    2:02 PM  GAD 7 : Generalized Anxiety Score  Nervous, Anxious, on Edge 2 3 3 2   Control/stop worrying 2 1 1  0  Worry too much - different things 2 2 3 2   Trouble relaxing 0 3 1 1   Restless 2 3 1 2   Easily annoyed or irritable 3 3 3 1   Afraid - awful might happen 0 2 1 1   Total GAD 7 Score 11 17 13  9

## 2023-09-07 ENCOUNTER — Ambulatory Visit (INDEPENDENT_AMBULATORY_CARE_PROVIDER_SITE_OTHER): Payer: MEDICAID | Admitting: Clinical

## 2023-09-07 ENCOUNTER — Ambulatory Visit: Payer: MEDICAID

## 2023-09-07 DIAGNOSIS — F331 Major depressive disorder, recurrent, moderate: Secondary | ICD-10-CM

## 2023-09-07 NOTE — Patient Instructions (Signed)
[facility] at [facility] [address] [place], [place] [address] [phone] (main office) [phone] (Aquan Kope's office)   

## 2023-11-27 ENCOUNTER — Encounter: Payer: Self-pay | Admitting: Advanced Practice Midwife

## 2023-11-27 ENCOUNTER — Ambulatory Visit: Admitting: Advanced Practice Midwife

## 2023-11-27 VITALS — BP 110/68 | HR 56 | Wt 186.0 lb

## 2023-11-27 DIAGNOSIS — F53 Postpartum depression: Secondary | ICD-10-CM

## 2023-11-27 DIAGNOSIS — Z30431 Encounter for routine checking of intrauterine contraceptive device: Secondary | ICD-10-CM | POA: Diagnosis not present

## 2023-11-27 DIAGNOSIS — Z1339 Encounter for screening examination for other mental health and behavioral disorders: Secondary | ICD-10-CM | POA: Diagnosis not present

## 2023-11-27 DIAGNOSIS — N921 Excessive and frequent menstruation with irregular cycle: Secondary | ICD-10-CM

## 2023-11-27 MED ORDER — HAILEY 24 FE 1-20 MG-MCG(24) PO TABS: 1.0000 | ORAL_TABLET | Freq: Every day | ORAL | 2 refills | Status: DC
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
## 2023-11-27 NOTE — Progress Notes (Addendum)
 23 y.o. GYN presents for Postpartum Depression, c/o spotting since December with IUD.  PHQ-9=21  //   GAD-7=16  Last PAP 10/19/2023 NILM.

## 2023-11-29 ENCOUNTER — Encounter: Payer: Self-pay | Admitting: Advanced Practice Midwife

## 2023-12-21 ENCOUNTER — Ambulatory Visit: Payer: MEDICAID | Admitting: Licensed Clinical Social Worker

## 2023-12-21 NOTE — BH Specialist Note (Signed)
 Virtual link sent to patient on this date and a call was provided. Patient reports her work schedule was changed from 8-4:30 and she would not be able to make this appointment. Appointment rescheduled for 12/26/23 at 4:15p.

## 2023-12-26 ENCOUNTER — Ambulatory Visit (INDEPENDENT_AMBULATORY_CARE_PROVIDER_SITE_OTHER): Admitting: Licensed Clinical Social Worker

## 2023-12-26 DIAGNOSIS — F439 Reaction to severe stress, unspecified: Secondary | ICD-10-CM | POA: Diagnosis not present

## 2023-12-26 DIAGNOSIS — F331 Major depressive disorder, recurrent, moderate: Secondary | ICD-10-CM

## 2023-12-26 NOTE — BH Specialist Note (Signed)
 Integrated Behavioral Health via Telemedicine Visit  12/29/2023 ALVERDA NAZZARO [id number]  Number of Integrated Behavioral Health Clinician visits: 4- Fourth Visit  Session Start time: 1620   Session End time: 1722  Total time in minutes: 62   Referring Provider: Arlester Bence, CNM Patient/Family location: Home   [facility] Provider location: Remote Office  All persons participating in visit: Patient and [facility] Types of Service: Individual psychotherapy and Video visit  I connected with [place] and/or Takima L Sessa's patient via  Telephone or Engineer, civil (consulting)  (Video is Surveyor, mining) and verified that I am speaking with the correct person using two identifiers. Discussed confidentiality: Yes   I discussed the limitations of telemedicine and the availability of in person appointments.  Discussed there is a possibility of technology failure and discussed alternative modes of communication if that failure occurs.  I discussed that engaging in this telemedicine visit, they consent to the provision of behavioral healthcare and the services will be billed under their insurance.  Patient and/or legal guardian expressed understanding and consented to Telemedicine visit: Yes   Presenting Concerns: Patient and/or family reports the following symptoms/concerns: increased stress, limited supports and difficulty with interpersonal relationships.  Duration of problem: Months; Severity of problem: moderate  Patient and/or Family's Strengths/Protective Factors: Concrete supports in place (healthy food, safe environments, etc.), Physical Health (exercise, healthy diet, medication compliance, etc.), and Parental Resilience  Goals Addressed: Patient will:  Reduce symptoms of: depression and stress   Increase knowledge and/or ability of: coping skills, healthy habits, and stress reduction   Demonstrate ability to: Increase healthy adjustment to current life  circumstances and Increase adequate support systems for patient/family  Progress towards Goals: Ongoing  Interventions: Interventions utilized:  Solution-Focused Strategies, Supportive Counseling, Psychoeducation and/or Health Education, Communication Skills, and Supportive Reflection Standardized Assessments completed: Not Needed  Patient and/or Family Response: Patient attended a virtual therapy session and presented with increased emotional distress, reporting feeling overwhelmed due to multiple ongoing stressors. She shared that she has been a mother since age 58 and is currently caring for three young children (ages 37, 50, and 7 months) as a single parent. The patient endorsed significant fatigue, disrupted sleep, and poor appetite, noting that she has little time or opportunity to care for herself. She is employed full-time (8 AM-4 PM) and explained that her children attend school from 7 AM-2 PM, requiring her to leave work early to pick them up, which has led to frequent use of her sick and vacation time. Despite limited support, the patient reported that the children's father is involved and shared that she feels comfortable initiating a constructive conversation with him to coordinate a schedule for shared childcare responsibilities, including school pick-ups and overnight stays. The patient demonstrated insight into the impact of her current routine on her well-being and identified small, manageable ways to incorporate self-care, including taking micro-breaks throughout the day. She also expressed a renewed commitment to medication adherence. Interventions in session focused on validating the patient's experiences, exploring realistic coping strategies, and reinforcing her progress in setting boundaries and seeking support. The patient was engaged, reflective, and motivated to implement the discussed changes.   Assessment: Patient currently experiencing increased emotional distress  characterized by feelings of overwhelm, exhaustion, and difficulty meeting her basic needs due to the demands of single parenting, full-time work, and limited personal support. She is also struggling with disrupted sleep, poor appetite, and limited time for self-care..   Patient may benefit from continued support  of integrated behavioral health. .  Plan: Follow up with behavioral health clinician on : 01/02/24 Behavioral recommendations: Remember to incorporate brief, intentional self-care breaks (micro-breaks) throughout the day to reduce stress and support emotional regulation. Establish a consistent co-parenting schedule with the children's father to share caregiving responsibilities and create space for personal well-being. Referral(s): Integrated [employer] (In Clinic)  I discussed the assessment and treatment plan with the patient and/or parent/guardian. They were provided an opportunity to ask questions and all were answered. They agreed with the plan and demonstrated an understanding of the instructions.   They were advised to call back or seek an in-person evaluation if the symptoms worsen or if the condition fails to improve as anticipated.  Bessye Stith Alfonza Iles, LCSWA

## 2024-01-02 ENCOUNTER — Ambulatory Visit: Admitting: Licensed Clinical Social Worker

## 2024-01-03 ENCOUNTER — Encounter: Admitting: Licensed Clinical Social Worker

## 2024-01-03 NOTE — BH Specialist Note (Signed)
 Virtual link was sent to patient on this date and a call was provided. Patient advised she can not meet today and asked to reschedule appointment.

## 2024-01-04 ENCOUNTER — Encounter: Admitting: Licensed Clinical Social Worker

## 2024-01-30 ENCOUNTER — Ambulatory Visit (INDEPENDENT_AMBULATORY_CARE_PROVIDER_SITE_OTHER): Payer: MEDICAID | Admitting: Advanced Practice Midwife

## 2024-01-30 ENCOUNTER — Encounter: Payer: Self-pay | Admitting: Advanced Practice Midwife

## 2024-01-30 VITALS — BP 115/77 | HR 83 | Ht 59.0 in | Wt 185.9 lb

## 2024-01-30 DIAGNOSIS — N921 Excessive and frequent menstruation with irregular cycle: Secondary | ICD-10-CM | POA: Diagnosis not present

## 2024-01-30 DIAGNOSIS — Z975 Presence of (intrauterine) contraceptive device: Secondary | ICD-10-CM | POA: Diagnosis not present

## 2024-01-30 DIAGNOSIS — F53 Postpartum depression: Secondary | ICD-10-CM | POA: Diagnosis not present

## 2024-01-30 MED ORDER — NORELGESTROMIN-ETH ESTRADIOL 150-35 MCG/24HR TD PTWK: 1.0000 | MEDICATED_PATCH | TRANSDERMAL | 2 refills | Status: DC

## 2024-01-30 NOTE — Progress Notes (Signed)
 Pt presents for f/u of bleeding. Bleeding is starting to turn bright red vs previously being similar to the color of iodine. Stopped for ~30 days, recently started 3 days ago.

## 2024-01-30 NOTE — Progress Notes (Signed)
   GYNECOLOGY PROGRESS NOTE  History:  23 y.o. [id number] presents to [facility] office today for problem gyn visit. She was seen in the office on 4/14 for breakthrough bleeding with IUD and s/sx of depression.  Rx was sent for OCPs for bleeding and pt was referred to [facility] and scheduled with Waneta Gut for counseling.  Pt reports she never started the OCPs. She is not good a taking pills at the same time every day.  She would like to follow up with the counselor again. She denies any intrusive thoughts or thoughts of self harm today.  She is still struggling with depression/anxiety/overwhelm and would like to try a leave of absence from work.    Last pap smear on 10/19/22 was normal.  Health Maintenance Due  Topic Date Due   HPV VACCINES (1 - 3-dose series) Never done   COVID-19 Vaccine (1 - 2024-25 season) Never done     Review of Systems:  Pertinent items are noted in HPI.   Objective:  Physical Exam Blood pressure 115/77, pulse 83, height 4' 11 (1.499 m), weight 185 lb 14.4 oz (84.3 kg), last menstrual period 01/27/2024, currently breastfeeding. VS reviewed, nursing note reviewed,  Constitutional: well developed, well nourished, no distress HEENT: normocephalic CV: normal rate Pulm/chest wall: normal effort Breast Exam: deferred Abdomen: soft Neuro: alert and oriented x 3 Skin: warm, dry Psych: affect normal Pelvic exam: Deferred  Assessment & Plan:  1. Breakthrough bleeding associated with intrauterine device (IUD) (Primary) --Reviewed options.  Pt would like to try contraceptive patch instead of pills.  Rx for Xulane sent to pharmacy with 2 refills. ---F/U in 2 months --If pt likes the patch, she may desire IUD removal as she prefers to have a monthly period  2. Postpartum depression --F/U with [facility]   Return in about 2 months (around 03/31/2024) for Gyn follow up for Abnormal Uterine Bleeding with me.   Arlester Bence, CNM 1:14 PM

## 2024-04-02 ENCOUNTER — Ambulatory Visit (INDEPENDENT_AMBULATORY_CARE_PROVIDER_SITE_OTHER): Admitting: Advanced Practice Midwife

## 2024-04-02 ENCOUNTER — Encounter: Payer: Self-pay | Admitting: Advanced Practice Midwife

## 2024-04-02 ENCOUNTER — Other Ambulatory Visit (HOSPITAL_COMMUNITY)
Admission: RE | Admit: 2024-04-02 | Discharge: 2024-04-02 | Disposition: A | Discharge status: Home / Self Care | Source: Ambulatory Visit | Attending: Advanced Practice Midwife | Admitting: Advanced Practice Midwife

## 2024-04-02 VITALS — BP 110/72 | HR 63 | Ht 59.0 in | Wt 192.6 lb

## 2024-04-02 DIAGNOSIS — N930 Postcoital and contact bleeding: Secondary | ICD-10-CM | POA: Diagnosis present

## 2024-04-02 DIAGNOSIS — Z30431 Encounter for routine checking of intrauterine contraceptive device: Secondary | ICD-10-CM | POA: Diagnosis not present

## 2024-04-02 DIAGNOSIS — F53 Postpartum depression: Secondary | ICD-10-CM

## 2024-04-02 DIAGNOSIS — F332 Major depressive disorder, recurrent severe without psychotic features: Secondary | ICD-10-CM

## 2024-04-02 MED ORDER — SERTRALINE HCL 50 MG PO TABS: 50.0000 mg | ORAL_TABLET | Freq: Every day | ORAL | 1 refills | Status: AC
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
## 2024-04-02 NOTE — Progress Notes (Deleted)
 Subjective:     Dana Flores is a 23 y.o. female here at [facility] *** for a routine exam.  Current complaints: ***.  Personal and family health history reviewed: {yes/no:9010}.  Do you have a primary care provider? *** Do you feel safe at home? ***  Flowsheet Row Office Visit from 04/02/2024 in [facility] at [facility] Total Score 4    Health Maintenance Due  Topic Date Due   HPV VACCINES (1 - 3-dose series) Never done   Hepatitis B Vaccines 19-59 Average Risk (1 of 3 - 19+ 3-dose series) Never done   COVID-19 Vaccine (1 - 2024-25 season) Never done   INFLUENZA VACCINE  03/15/2024     Risk factors for chronic health problems: Smoking: Alchohol/how much: Pt BMI: Body mass index is 38.9 kg/m.   Gynecologic History Patient's last menstrual period was 03/26/2024 (exact date). Contraception: IUD Last Pap: 10/19/22. Results were: normal Last mammogram: N/A. Results were: N/A  Obstetric History OB History  Gravida Para Term Preterm AB Living  6 3 3  0 3 3  SAB IAB Ectopic Multiple Live Births  0 3 0 0 3    # Outcome Date GA Lbr Len/2nd Weight Sex Type Anes PTL Lv  6 Term 05/09/23 [redacted]w[redacted]d / 00:13 3800 g M Vag-Spont EPI  LIV  5 Term 10/23/20 [redacted]w[redacted]d 09:05 / 00:18 3685 g F Vag-Spont EPI  LIV  4 IAB 10/2019          3 Term 08/21/18 [redacted]w[redacted]d 11:53 / 01:16 3255 g M Vag-Spont EPI  LIV  2 IAB           1 IAB              The following portions of the patient's history were reviewed and updated as appropriate: allergies, current medications, past family history, past medical history, past social history, past surgical history, and problem list.  Review of Systems Pertinent items noted in HPI and remainder of comprehensive ROS otherwise negative.    Objective:   Today's Vitals   04/02/24 1123  BP: 110/72  Pulse: 63  Weight: 87.4 kg  Height: 4' 11 (1.499 m)   Body mass index is 38.9 kg/m.  VS reviewed, nursing note reviewed,  Constitutional:  well developed, well nourished, no distress HEENT: normocephalic, thyroid  without enlargement or mass HEART: RRR, no murmurs rubs/gallops RESP: clear and equal to auscultation bilaterally in all lobes  Breast Exam:  Deferred Abdomen: soft Neuro: alert and oriented x 3 Skin: warm, dry Psych: affect normal Pelvic exam:  IUD strings visualized.    04/02/2024   11:37 AM 11/27/2023    9:08 AM 09/07/2023   11:10 AM 07/03/2023    8:55 AM  GAD 7 : Generalized Anxiety Score  Nervous, Anxious, on Edge 3 1 2 3   Control/stop worrying 3 2 2 1   Worry too much - different things 3 3 2 2   Trouble relaxing 3 3 0 3  Restless 3 2 2 3   Easily annoyed or irritable 3 3 3 3   Afraid - awful might happen 3 2 0 2  Total GAD 7 Score 21 16 11 17   Anxiety Difficulty  Somewhat difficult       Flowsheet Row Office Visit from 04/02/2024 in [facility] at [facility] Total Score 22        Assessment/Plan:  1. IUD check up (Primary) Strings visualized on exam about 3 cm  for os  2. Postpartum depression - Ambulatory referral to Psychiatry  3. Severe episode of recurrent major depressive disorder, without psychotic features (HCC) Patient scored high on her GAD 7 and PHQ9 screenings today. Denies thoughts or plan to harm herself or others. -Follow up with [facility] - Ambulatory referral to Psychiatry - sertraline  (ZOLOFT ) 50 MG tablet; Take 1 tablet (50 mg total) by mouth daily.  Dispense: 30 tablet; Refill: 1  4. PCB (post coital bleeding) - US  Pelvis Complete; Future  - Cervicovaginal ancillary only( [place])    Return in about 1 month (around 05/03/2024) for Follow up.   Derrek [name], FNP Student 12:43 PM

## 2024-04-02 NOTE — Progress Notes (Signed)
 Pt states she had a full on period earlier this month. Pt states period was same as before in flow.   Pt wore BC patch for 3 days; hasn't worn since.   Would like provider to assess position of IUD.   Pt interested in talking to someone about depression. States she doesn't feel its postpartum related.   PHQ of 22 including several days of occurring thoughts of hurting herself or that she would be better off dead. No plans to harm herself.   Scored 21 on GAD. Referral to behavioral health made, pt aware.

## 2024-04-03 LAB — CERVICOVAGINAL ANCILLARY ONLY
Bacterial Vaginitis (gardnerella): POSITIVE — AB
Candida Glabrata: NEGATIVE
Candida Vaginitis: NEGATIVE
Chlamydia: NEGATIVE
Comment: NEGATIVE
Comment: NEGATIVE
Comment: NEGATIVE
Comment: NEGATIVE
Comment: NEGATIVE
Comment: NORMAL
Neisseria Gonorrhea: NEGATIVE
Trichomonas: NEGATIVE

## 2024-04-06 ENCOUNTER — Ambulatory Visit: Payer: Self-pay | Admitting: Advanced Practice Midwife

## 2024-04-06 MED ORDER — METRONIDAZOLE 500 MG PO TABS: 500.0000 mg | ORAL_TABLET | Freq: Two times a day (BID) | ORAL | 0 refills | Status: AC

## 2024-04-19 ENCOUNTER — Encounter: Admitting: Licensed Clinical Social Worker

## 2024-04-29 ENCOUNTER — Ambulatory Visit (INDEPENDENT_AMBULATORY_CARE_PROVIDER_SITE_OTHER): Admitting: Licensed Clinical Social Worker

## 2024-04-29 DIAGNOSIS — F331 Major depressive disorder, recurrent, moderate: Secondary | ICD-10-CM
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
## 2024-05-03 ENCOUNTER — Ambulatory Visit: Admitting: Obstetrics and Gynecology

## 2024-05-08 ENCOUNTER — Ambulatory Visit: Admitting: Licensed Clinical Social Worker

## 2024-05-08 DIAGNOSIS — F331 Major depressive disorder, recurrent, moderate: Secondary | ICD-10-CM
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
## 2024-05-13 ENCOUNTER — Ambulatory Visit: Admitting: Obstetrics and Gynecology

## 2024-05-13 DIAGNOSIS — Z1331 Encounter for screening for depression: Secondary | ICD-10-CM

## 2024-05-13 NOTE — Progress Notes (Signed)
 Patient here for follow up on ultrasound which she did not have. Appointment will be rescheduled following her ultrasound  Briefly spoke with patient in nurse check in room regarding elevated depression screening score. Patient reports taking zoloft  and is being followed by a therapist. She denies suicidal ideations. She reports that she is able to follow her daily routine. She declined an increase in the zoloft  dosing as it caused her some dizziness. Patient advised to follow up with psychiatrist regarding a change in medication.   All questions were answered and patient verbalized understanding. Ultrasound scheduled for 10/2

## 2024-05-13 NOTE — Progress Notes (Signed)
 23 y.o. GYN presents for Follow up.  US  needs to be scheduled  PHQ-9=15  //GAD-7=16, Pt needs to reschedule her appt with Counselor.  She is taking Zoloft  50 mg daily.

## 2024-05-16 ENCOUNTER — Ambulatory Visit (HOSPITAL_COMMUNITY)
Admission: RE | Admit: 2024-05-16 | Discharge: 2024-05-16 | Disposition: A | Discharge status: Home / Self Care | Source: Ambulatory Visit | Attending: Advanced Practice Midwife | Admitting: Advanced Practice Midwife

## 2024-05-16 DIAGNOSIS — N930 Postcoital and contact bleeding: Secondary | ICD-10-CM | POA: Insufficient documentation

## 2024-05-20 ENCOUNTER — Encounter: Admitting: Licensed Clinical Social Worker

## 2024-05-29 ENCOUNTER — Encounter: Admitting: Physician Assistant
